# Patient Record
Sex: Male | Born: 1935 | Race: White | Hispanic: No | Marital: Married | Smoking: Never smoker
Health system: Southern US, Community
[De-identification: ages and names within clinical notes are randomized; demographics above are authoritative.]

## PROBLEM LIST (undated history)

## (undated) DIAGNOSIS — I499 Cardiac arrhythmia, unspecified: Secondary | ICD-10-CM

## (undated) DIAGNOSIS — K219 Gastro-esophageal reflux disease without esophagitis: Secondary | ICD-10-CM

## (undated) DIAGNOSIS — R55 Syncope and collapse: Secondary | ICD-10-CM

## (undated) DIAGNOSIS — I255 Ischemic cardiomyopathy: Secondary | ICD-10-CM

## (undated) DIAGNOSIS — I219 Acute myocardial infarction, unspecified: Secondary | ICD-10-CM

## (undated) DIAGNOSIS — N289 Disorder of kidney and ureter, unspecified: Secondary | ICD-10-CM

## (undated) DIAGNOSIS — F419 Anxiety disorder, unspecified: Secondary | ICD-10-CM

## (undated) DIAGNOSIS — Z8711 Personal history of peptic ulcer disease: Secondary | ICD-10-CM

## (undated) DIAGNOSIS — I519 Heart disease, unspecified: Secondary | ICD-10-CM

## (undated) DIAGNOSIS — I472 Ventricular tachycardia, unspecified: Secondary | ICD-10-CM

## (undated) DIAGNOSIS — F410 Panic disorder [episodic paroxysmal anxiety] without agoraphobia: Secondary | ICD-10-CM

## (undated) DIAGNOSIS — E875 Hyperkalemia: Secondary | ICD-10-CM

## (undated) DIAGNOSIS — I10 Essential (primary) hypertension: Secondary | ICD-10-CM

## (undated) DIAGNOSIS — E785 Hyperlipidemia, unspecified: Secondary | ICD-10-CM

## (undated) DIAGNOSIS — N400 Enlarged prostate without lower urinary tract symptoms: Secondary | ICD-10-CM

## (undated) DIAGNOSIS — I509 Heart failure, unspecified: Secondary | ICD-10-CM

## (undated) DIAGNOSIS — I2510 Atherosclerotic heart disease of native coronary artery without angina pectoris: Secondary | ICD-10-CM

## (undated) HISTORY — PX: ESOPHAGOGASTRODUODENOSCOPY: SHX1529

## (undated) HISTORY — DX: Essential (primary) hypertension: I10

## (undated) HISTORY — DX: Anxiety disorder, unspecified: F41.9

## (undated) HISTORY — DX: Disorder of kidney and ureter, unspecified: N28.9

## (undated) HISTORY — DX: Hyperlipidemia, unspecified: E78.5

## (undated) HISTORY — DX: Cardiac arrhythmia, unspecified: I49.9

## (undated) HISTORY — DX: Panic disorder (episodic paroxysmal anxiety): F41.0

## (undated) HISTORY — DX: Ventricular tachycardia, unspecified: I47.20

## (undated) HISTORY — DX: Heart failure, unspecified: I50.9

## (undated) HISTORY — DX: Acute myocardial infarction, unspecified: I21.9

## (undated) HISTORY — DX: Heart disease, unspecified: I51.9

## (undated) HISTORY — PX: CORONARY ANGIOPLASTY: SHX604

## (undated) HISTORY — DX: Personal history of peptic ulcer disease: Z87.11

## (undated) HISTORY — DX: Ventricular tachycardia: I47.2

## (undated) HISTORY — DX: Syncope and collapse: R55

## (undated) HISTORY — PX: UMBILICAL HERNIA REPAIR: SHX196

## (undated) HISTORY — PX: COLONOSCOPY: SHX174

## (undated) HISTORY — DX: Ischemic cardiomyopathy: I25.5

## (undated) HISTORY — DX: Hyperkalemia: E87.5

## (undated) HISTORY — PX: CARDIAC DEFIBRILLATOR PLACEMENT: SHX171

---

## 1986-10-28 DIAGNOSIS — I219 Acute myocardial infarction, unspecified: Secondary | ICD-10-CM

## 1986-10-28 HISTORY — DX: Acute myocardial infarction, unspecified: I21.9

## 1998-03-28 ENCOUNTER — Other Ambulatory Visit: Admission: RE | Admit: 1998-03-28 | Discharge: 1998-03-28 | Payer: Self-pay | Admitting: Cardiology

## 1999-06-28 ENCOUNTER — Ambulatory Visit (HOSPITAL_COMMUNITY): Admission: RE | Admit: 1999-06-28 | Discharge: 1999-06-28 | Payer: Self-pay | Admitting: Gastroenterology

## 2001-10-28 HISTORY — PX: CARDIAC CATHETERIZATION: SHX172

## 2001-12-29 ENCOUNTER — Ambulatory Visit (HOSPITAL_COMMUNITY): Admission: RE | Admit: 2001-12-29 | Discharge: 2001-12-30 | Payer: Self-pay | Admitting: Cardiology

## 2003-10-29 HISTORY — PX: INSERT / REPLACE / REMOVE PACEMAKER: SUR710

## 2003-11-23 ENCOUNTER — Encounter: Admission: RE | Admit: 2003-11-23 | Discharge: 2003-11-23 | Payer: Self-pay | Admitting: Family Medicine

## 2004-04-04 ENCOUNTER — Encounter (INDEPENDENT_AMBULATORY_CARE_PROVIDER_SITE_OTHER): Payer: Self-pay | Admitting: *Deleted

## 2004-04-09 ENCOUNTER — Ambulatory Visit (HOSPITAL_COMMUNITY): Admission: RE | Admit: 2004-04-09 | Discharge: 2004-04-09 | Payer: Self-pay | Admitting: Gastroenterology

## 2004-04-23 ENCOUNTER — Inpatient Hospital Stay (HOSPITAL_COMMUNITY): Admission: RE | Admit: 2004-04-23 | Discharge: 2004-04-24 | Payer: Self-pay | Admitting: Internal Medicine

## 2007-08-26 ENCOUNTER — Emergency Department (HOSPITAL_COMMUNITY): Admission: EM | Admit: 2007-08-26 | Discharge: 2007-08-26 | Payer: Self-pay | Admitting: Emergency Medicine

## 2007-12-30 ENCOUNTER — Encounter: Admission: RE | Admit: 2007-12-30 | Discharge: 2007-12-30 | Payer: Self-pay | Admitting: Family Medicine

## 2008-03-28 ENCOUNTER — Ambulatory Visit: Payer: Self-pay | Admitting: Internal Medicine

## 2009-01-24 ENCOUNTER — Encounter: Payer: Self-pay | Admitting: Internal Medicine

## 2009-04-11 DIAGNOSIS — I10 Essential (primary) hypertension: Secondary | ICD-10-CM

## 2009-04-11 DIAGNOSIS — I472 Ventricular tachycardia, unspecified: Secondary | ICD-10-CM | POA: Insufficient documentation

## 2009-04-11 DIAGNOSIS — R55 Syncope and collapse: Secondary | ICD-10-CM | POA: Insufficient documentation

## 2009-04-11 DIAGNOSIS — I509 Heart failure, unspecified: Secondary | ICD-10-CM | POA: Insufficient documentation

## 2009-04-24 ENCOUNTER — Ambulatory Visit: Payer: Self-pay | Admitting: Internal Medicine

## 2009-09-08 ENCOUNTER — Encounter: Payer: Self-pay | Admitting: Internal Medicine

## 2009-09-08 ENCOUNTER — Encounter (INDEPENDENT_AMBULATORY_CARE_PROVIDER_SITE_OTHER): Payer: Self-pay | Admitting: *Deleted

## 2010-05-08 LAB — LIPID PANEL
Cholesterol: 118 mg/dL (ref 0–200)
HDL: 34 mg/dL — AB (ref 35–70)

## 2010-06-15 ENCOUNTER — Encounter (INDEPENDENT_AMBULATORY_CARE_PROVIDER_SITE_OTHER): Payer: Self-pay | Admitting: *Deleted

## 2010-06-20 ENCOUNTER — Ambulatory Visit: Payer: Self-pay | Admitting: Cardiology

## 2010-07-18 ENCOUNTER — Ambulatory Visit: Payer: Self-pay | Admitting: Cardiology

## 2010-07-19 ENCOUNTER — Ambulatory Visit: Payer: Self-pay | Admitting: Cardiology

## 2010-07-19 ENCOUNTER — Ambulatory Visit (HOSPITAL_COMMUNITY)
Admission: RE | Admit: 2010-07-19 | Discharge: 2010-07-19 | Payer: Self-pay | Source: Home / Self Care | Admitting: Cardiology

## 2010-07-26 ENCOUNTER — Encounter: Payer: Self-pay | Admitting: Internal Medicine

## 2010-07-26 ENCOUNTER — Telehealth (INDEPENDENT_AMBULATORY_CARE_PROVIDER_SITE_OTHER): Payer: Self-pay | Admitting: *Deleted

## 2010-09-24 ENCOUNTER — Encounter: Payer: Self-pay | Admitting: Internal Medicine

## 2010-09-30 ENCOUNTER — Encounter: Payer: Self-pay | Admitting: Internal Medicine

## 2010-10-01 ENCOUNTER — Ambulatory Visit: Payer: Self-pay | Admitting: Internal Medicine

## 2010-10-17 ENCOUNTER — Telehealth (INDEPENDENT_AMBULATORY_CARE_PROVIDER_SITE_OTHER): Payer: Self-pay | Admitting: *Deleted

## 2010-11-06 ENCOUNTER — Encounter (INDEPENDENT_AMBULATORY_CARE_PROVIDER_SITE_OTHER): Payer: Self-pay | Admitting: *Deleted

## 2010-11-27 ENCOUNTER — Encounter: Payer: Self-pay | Admitting: Cardiology

## 2010-11-27 DIAGNOSIS — F410 Panic disorder [episodic paroxysmal anxiety] without agoraphobia: Secondary | ICD-10-CM | POA: Insufficient documentation

## 2010-11-27 DIAGNOSIS — I255 Ischemic cardiomyopathy: Secondary | ICD-10-CM | POA: Insufficient documentation

## 2010-11-27 DIAGNOSIS — E785 Hyperlipidemia, unspecified: Secondary | ICD-10-CM | POA: Insufficient documentation

## 2010-11-27 DIAGNOSIS — F419 Anxiety disorder, unspecified: Secondary | ICD-10-CM | POA: Insufficient documentation

## 2010-11-27 NOTE — Cardiovascular Report (Signed)
Summary: Certified Letter Signed - Patient  Certified Letter Signed - Patient   Imported By: Debby Freiberg 08/27/2010 15:40:33  _____________________________________________________________________  External Attachment:    Type:   Image     Comment:   External Document

## 2010-11-27 NOTE — Cardiovascular Report (Signed)
Summary: Quick Look Report  Quick Look Report   Imported By: Kassie Mends 11/22/2009 10:48:06  _____________________________________________________________________  External Attachment:    Type:   Image     Comment:   External Document

## 2010-11-27 NOTE — Letter (Signed)
Summary: Device-Delinquent Phone Journalist, newspaper, Main Office  [address]   [place], [place] [address]   Phone: [phone]  Fax: [phone]     September 24, 2010 MRN: [id number]   Adrian Pace [address] [place], [place]  [address]   Dear [name],  According to our records, you were scheduled for a device phone transmission on 09-20-2010 .     We did not receive any results from this check.  If you transmitted on your scheduled day, please call us to help troubleshoot your system.  If you forgot to send your transmission, please send one upon receipt of this letter.  Thank you,   Arch[facility]

## 2010-11-27 NOTE — Letter (Signed)
Summary: Device-Delinquent Check  [facility], Main Office  [address]   [place], [place] [address]   Phone: [phone]  Fax: [phone]     June 15, 2010 MRN: [id number]   Adrian Pace [address] [place], [place]  [address]   Dear [name],  According to our records, you have not had your implanted device checked in the recommended period of time.  We are unable to determine appropriate device function without checking your device on a regular basis.  Please call our office to schedule an appointment, with Adrian Pace,  as soon as possible.  If you are having your device checked by another physician, please call us so that we may update our records.  Thank you,  Altha Harm, LPN  June 15, 2010 2:18 PM  [facility]  certified mail

## 2010-11-27 NOTE — Progress Notes (Signed)
  Phone Note Call from Patient   Caller: Patient Summary of Call: pt calling letter from [facility]-pt had device checked 07-03-10 by dr Maylon Cos, due to have another in december Initial call taken by: Glynda Jaeger,  July 26, 2010 10:47 AM

## 2010-11-29 NOTE — Letter (Signed)
Summary: Remote Device Check  [facility], Main Office  [address]   [place], [place] [address]   Phone: [phone]  Fax: [phone]     November 06, 2010 MRN: [id number]   Adrian Pace [address] [place], [place]  [address]   Dear [name],   Your remote transmission was recieved and reviewed by your physician.  All diagnostics were within normal limits for you.  __X___Your next transmission is scheduled for:  01-03-11.  Please transmit at any time this day.  If you have a wireless device your transmission will be sent automatically.   Sincerely,  Vella Kohler

## 2010-11-29 NOTE — Progress Notes (Signed)
Summary: device phone transmission  Phone Note Call from Patient Call back at [phone]   Caller: Patient Reason for Call: Talk to Nurse Summary of Call: pt would like to know the results of his device phone transmission.  Initial call taken by: Roe Coombs,  October 17, 2010 10:41 AM  Follow-up for Phone Call        transmission recieved, patient aware.   Follow-up by: Altha Harm, LPN,  October 19, 2010 8:08 AM

## 2010-11-29 NOTE — Cardiovascular Report (Signed)
Summary: Office Visit Remote   Office Visit Remote   Imported By: Roderic Ovens 11/09/2010 12:08:15  _____________________________________________________________________  External Attachment:    Type:   Image     Comment:   External Document

## 2011-01-03 ENCOUNTER — Encounter: Payer: Self-pay | Admitting: Internal Medicine

## 2011-01-03 ENCOUNTER — Encounter (INDEPENDENT_AMBULATORY_CARE_PROVIDER_SITE_OTHER): Payer: Medicare Other

## 2011-01-03 DIAGNOSIS — I428 Other cardiomyopathies: Secondary | ICD-10-CM

## 2011-01-10 ENCOUNTER — Encounter: Payer: Self-pay | Admitting: *Deleted

## 2011-01-15 NOTE — Cardiovascular Report (Signed)
Summary: Office Visit   Office Visit   Imported By: Roderic Ovens 01/11/2011 14:55:43  _____________________________________________________________________  External Attachment:    Type:   Image     Comment:   External Document

## 2011-01-15 NOTE — Letter (Signed)
Summary: Remote Device Check  [facility], Main Office  [address]   [place], [place] [address]   Phone: [phone]  Fax: [phone]     January 10, 2011 MRN: [id number]   Adrian Pace [address] [place], [place]  [address]   Dear Mr. AHRENDT,   Your remote transmission was recieved and reviewed by your physician.  All diagnostics were within normal limits for you.  _____Your next transmission is scheduled for:                       .  Please transmit at any time this day.  If you have a wireless device your transmission will be sent automatically.  ___X___Your next office visit is scheduled for:  June with Dr. Ladona Ridgel.                            . Please call our office to schedule an appointment.    Sincerely,  Altha Harm, LPN

## 2011-01-16 ENCOUNTER — Encounter: Payer: Self-pay | Admitting: Cardiology

## 2011-01-16 ENCOUNTER — Ambulatory Visit (INDEPENDENT_AMBULATORY_CARE_PROVIDER_SITE_OTHER): Payer: Medicare Other | Admitting: Cardiology

## 2011-01-16 ENCOUNTER — Other Ambulatory Visit: Payer: Self-pay | Admitting: Cardiology

## 2011-01-16 VITALS — BP 130/68 | HR 62 | Ht 69.0 in | Wt 182.0 lb

## 2011-01-16 DIAGNOSIS — I255 Ischemic cardiomyopathy: Secondary | ICD-10-CM

## 2011-01-16 DIAGNOSIS — I2589 Other forms of chronic ischemic heart disease: Secondary | ICD-10-CM

## 2011-01-16 DIAGNOSIS — I472 Ventricular tachycardia, unspecified: Secondary | ICD-10-CM

## 2011-01-16 DIAGNOSIS — E7800 Pure hypercholesterolemia, unspecified: Secondary | ICD-10-CM

## 2011-01-16 DIAGNOSIS — Z79899 Other long term (current) drug therapy: Secondary | ICD-10-CM

## 2011-01-16 DIAGNOSIS — I519 Heart disease, unspecified: Secondary | ICD-10-CM

## 2011-01-16 LAB — COMPREHENSIVE METABOLIC PANEL
Albumin: 4.4 g/dL (ref 3.5–5.2)
Alkaline Phosphatase: 74 U/L (ref 39–117)
BUN: 17 mg/dL (ref 6–23)
CO2: 27 mEq/L (ref 19–32)
Calcium: 9.5 mg/dL (ref 8.4–10.5)
Chloride: 106 mEq/L (ref 96–112)
Glucose, Bld: 89 mg/dL (ref 70–99)
Potassium: 4.7 mEq/L (ref 3.5–5.3)
Sodium: 144 mEq/L (ref 135–145)
Total Bilirubin: 0.5 mg/dL (ref 0.3–1.2)
Total Protein: 7.1 g/dL (ref 6.0–8.3)

## 2011-01-16 LAB — LIPID PANEL
Cholesterol: 118 mg/dL (ref 0–200)
HDL: 31 mg/dL — ABNORMAL LOW (ref >39)
Triglycerides: 88 mg/dL (ref <150)
VLDL: 18 mg/dL (ref 0–40)

## 2011-01-16 NOTE — Assessment & Plan Note (Signed)
ICD followup per Dr. Sharrell Ku in June 2012. We will ask Dr. Ladona Ridgel to be his primary cardiologist

## 2011-01-16 NOTE — Assessment & Plan Note (Signed)
His ejection fraction remains in the 25% range from his 2-D echocardiogram of September 2011. Perhaps on future followup, he will need to have a LexiScan Myoview.

## 2011-01-16 NOTE — Progress Notes (Signed)
Subjective:Adrian Pace is seen today for followup visit. He has an ICD is checked by Dr. Sharrell Ku and he will see Dr. Ladona Ridgel for routine followup of his ICD as well as general cardiology in June 2012. It may be that Adrian Pace will need a followup related to his ischemic heart disease as well as left ventricular dysfunction and preventive cardiology. Overall, he clinically is doing well without significant complaints.   Current Outpatient Prescriptions  Medication Sig Dispense Refill  . aspirin 325 MG tablet Take 325 mg by mouth daily.        . benazepril (LOTENSIN) 20 MG tablet Take 20 mg by mouth daily.        . carvedilol (COREG) 25 MG tablet Take 25 mg by mouth 2 (two) times daily with meals.        . simvastatin (ZOCOR) 40 MG tablet Take 40 mg by mouth at bedtime.        . niacin (NIASPAN) 500 MG CR tablet Take 500 mg by mouth at bedtime.          No Known Allergies  Patient Active Problem List  Diagnoses  . DYSLIPIDEMIA  . HYPERTENSION  . VENTRICULAR TACHYCARDIA  . CHF  . SYNCOPE  . Ischemic cardiomyopathy  . Renal insufficiency  . Hyperlipemia  . HTN (hypertension)  . LV dysfunction  . Anxiety  . Panic attacks  . Hyperkalemia    History  Smoking status  . Never Smoker   Smokeless tobacco  . Not on file    History  Alcohol Use  . Yes    occassionally    No family history on file.  Review of Systems: The patient denies any heat or cold intolerance.  No weight gain or weight loss.  The patient denies headaches or blurry vision.  There is no cough or sputum production.  The patient denies dizziness.  There is no hematuria or hematochezia.  The patient denies any muscle aches or arthritis.  The patient denies any rash.  The patient denies frequent falling or instability.  There is no history of depression or anxiety.  All other systems were reviewed and are negative.   Physical Exam:vital signs are reviewed.The head is normocephalic and atraumatic.  Pupils are equally  round and reactive to light.  Sclerae nonicteric.  Conjunctiva is clear.  Oropharynx is unremarkable.  There's adequate oral airway.  Neck is supple there are no masses.  Thyroid is not enlarged.  There is no lymphadenopathy.  Lungs are clear.  Chest is symmetric.  Heart shows a regular rate and rhythm.  S1 and S2 are normal.  There is no murmur click or gallop.  Abdomen is soft normal bowel sounds.  There is no organomegaly.  Genital and rectal deferred.  Extremities are without edema.  Peripheral pulses are adequate.  Neurologically intact.  Full range of motion.  The patient is not depressed.  Skin is warm and dry.   Assessment / Plan:

## 2011-01-16 NOTE — Assessment & Plan Note (Signed)
His last stress Cardiolite study was approximately 2 years ago. In general, he's been able to exercise without symptoms. He's not had any ICD discharges. He's had previous angioplasty and stents but is never had bypass surgery. He had his anterior myocardial infarction in November of 1988. In general, his main issue is his ICD followup. We'll check C. Met and lipids today.

## 2011-01-17 ENCOUNTER — Telehealth: Payer: Self-pay | Admitting: *Deleted

## 2011-01-17 NOTE — Telephone Encounter (Signed)
Message copied by Barnetta Hammersmith on Thu Jan 17, 2011  9:24 AM ------      Message from: Norma Fredrickson      Created: Thu Jan 17, 2011  8:20 AM       Continue same medicines. Recheck in 6 months.

## 2011-01-22 ENCOUNTER — Telehealth: Payer: Self-pay | Admitting: *Deleted

## 2011-01-22 NOTE — Telephone Encounter (Signed)
Message copied by Barnetta Hammersmith on Tue Jan 22, 2011  2:57 PM ------      Message from: Norma Fredrickson      Created: Thu Jan 17, 2011  8:20 AM       Continue same medicines. Recheck in 6 months.

## 2011-01-22 NOTE — Telephone Encounter (Signed)
Pt notified of lab results and instructed to continue same medications.  Pt was notified to have labs in six months.

## 2011-03-12 NOTE — Assessment & Plan Note (Signed)
Adrian Pace                         ELECTROPHYSIOLOGY OFFICE NOTE   Adrian Pace                       MRN:          [id number]  DATE:03/28/2008                            DOB:          09-Jun-1933    Adrian Pace returns today for followup.  He is very pleasant middle-aged  male with ischemic cardiomyopathy, class I heart failure, a history of  VT who is status post ICD insertion.  He returns today for followup.  He  had no specific complaints today and has had no recent ICD shocks.   MEDICATIONS:  1. Niaspan 500 mg 2 tablets daily.  2. Zocor 40 mg daily.  3. Coreg 25 twice daily.  4. Lotensin 20 daily.  5. Zoloft 50 daily.  6. Aspirin for 325 daily.   PHYSICAL EXAM:  He is a pleasant well-appearing man in no acute  distress.  Blood pressure was 111/ 66.  The pulse was 67 and regular.  The  respirations were 18, and the weight was 155 pounds.  NECK:  No jugular distention.  LUNGS:  Clear bilaterally to auscultation.  No wheezes, rales or rhonchi  are present.  CARDIAC:  Regular rate and rhythm, normal S1-S2.  EXTREMITIES:  No edema.   Interrogation of his defibrillator demonstrates a Medtronic Maximo with  R-waves of 7, the impedance 368, the threshold 40.2.  Battery voltage  was 3.1 volts.  There are no intercurrent IC therapies.  He did have 8  episodes of nonsustained VT.   IMPRESSION:  1. Ischemic cardiomyopathy.  2. Ventricular tachycardia status post ICD shocks.  3. Congestive heart failure class I well-controlled.   DISCUSSION:  Adrian Pace is stable, and his defibrillator is working  normally.  I have recommend that we see him back in the office in 1 year  for ICD followup.  He will continue to follow up with Dr. Reyes Ivan.     Doylene Canning. Ladona Ridgel, MD  Electronically Signed    GWT/MedQ  DD: 03/28/2008  DT: 03/28/2008  Job #: [id number]

## 2011-03-15 NOTE — Op Note (Signed)
NAME:  Adrian Pace, Adrian Pace NO.:  [id number]   MEDICAL RECORD NO.:  [id number]                   PATIENT TYPE:  AMB   LOCATION:  ENDO                                 FACILITY:  [facility]   PHYSICIAN:  Petra Kuba, M.D.                 DATE OF BIRTH:  1932/11/02   DATE OF PROCEDURE:  04/04/2004  DATE OF DISCHARGE:                                 OPERATIVE REPORT   PROCEDURE PERFORMED:  Esophagogastroduodenoscopy with biopsy.   ENDOSCOPIST:  Petra Kuba, M.D.   INDICATIONS FOR PROCEDURE:  Upper tract symptoms helped with H2 blockers.  Want to rule out Barrett's or other etiologies.  Consent was signed after  the risks, benefits, methods and options were thoroughly discussed in the  office.   MEDICINES USED:  Additional medicines for this procedure were Demerol 10 mg,  Versed 2 mg.   DESCRIPTION OF PROCEDURE:  The video endoscope was inserted by direct  vision.  Proximal and midesophagus was normal.  In the distal esophagus was  a tiny small hiatal hernia with a widely patent ring.  No signs of c or  esophageal inflammation was seen.  Scope passed into the stomach and  advanced to the antrum where some antral erosions were seen probably aspirin  induced and advanced through a normal pylorus into a normal duodenal bulb,  pertinent for some mild bulbitis and around the C-loop to a normal second  portion of the duodenum.  The scope was withdrawn back to the bulb which  confirmed the mild bulbitis.  Scope was withdrawn back to the stomach and  retroflexed.  The angularis, cardia and fundus, lesser and greater curve  were all normal.  Straight visualization of the stomach did not reveal any  additional findings.  We went ahead and took a few biopsies of the antral  erosions and a few of the proximal stomach to rule out Helicobacter pylori.  Air was suctioned, scope was slowly withdrawn.  Again, a good look at the  esophagus was normal.  Scope was removed.  The  patient tolerated the  procedure well.  There were no obvious immediate complication.   ENDOSCOPIC DIAGNOSIS:  1. Tiny to small hiatal hernia with a widely patent ring.  2. Antral erosions, status post biopsy.  3. Mild bulbitis.  4. Otherwise normal esophagogastroduodenoscopy with some proximal stomach     biopsies taken as well to rule out Helicobacter pylori.   PLAN:  Continue Prilosec or Prevacid 15.  May need to upgrade or increase  the strength.  Probably go ahead and treat Helicobacter pylori if positive  and based on endoscopic findings if this were to get worse could lead to  ulceration.  Happy to see back p.r.n.  Otherwise return care to Drs.  Arvilla Market and Douglas for the customary health care maintenance.  Petra Kuba, M.D.    MEM/MEDQ  D:  04/04/2004  T:  04/04/2004  Job:  [id number]   cc:   Colleen Can. Deborah Chalk, M.D.  Fax: [phone]   Donia Guiles, M.D.  [address] [place]  [place] [address]  Fax: [phone]

## 2011-03-15 NOTE — Op Note (Signed)
NAME:  Adrian Pace, Adrian Pace NO.:  [id number]   MEDICAL RECORD NO.:  [id number]                   PATIENT TYPE:  OIB   LOCATION:  3741                                 FACILITY:  [facility]   PHYSICIAN:  Doylene Canning. Ladona Ridgel, M.D.               DATE OF BIRTH:  27-Feb-1933   DATE OF PROCEDURE:  04/23/2004  DATE OF DISCHARGE:                                 OPERATIVE REPORT   PROCEDURE:  Insertion of a single chamber defibrillator with defibrillation  threshold and device testing.   OPERATOR:  Doylene Canning. Ladona Ridgel, M.D.   INDICATIONS:  Ischemic cardiomyopathy status post MI with ejection fraction  of 29%.   HISTORY OF PRESENT ILLNESS:  A. The patient is a very pleasant 77 year old male with a history of     coronary artery disease status post MI.  He has an ejection fraction of     29%.  He is status post stenting to the right coronary artery and LAD in     3/03.  He now is referred for ICD insertion.   DESCRIPTION OF PROCEDURE:  A. After informed consent was obtained, the patient was taken to the     diagnostic EP lab in a fasting state.  After the usual prepping and     draping, intravenous Fentanyl and midazolam was given for sedation. Then     30 cc of Lidocaine was infiltrated into the left infraclavicular region.     A 9 cm incision was carried out over this region and electrocautery     utilized to dissect down to the fascial plane. Ten cc of contrast was     injected into the left upper extremity venous system, demonstrating a     patent left subclavian vein.  It was subsequently punctured x1 and the     Medtronic Model 6949, 65 cm active-tip fixation fibrillation lead, serial     #[id number] was advanced into the right ventricle.  Next, mapping was     secured high in the right ventricle at the final site near the artery     apex.  R waves measured 15 MV and the patient impendence was 884 ohms,     once the lead was actively fixed.  Patient threshold was 0.4  volts at 0.4     msc.   At this point, 10 volt pacing did not stimulate the diaphragm.  The lead was  then secured to the subpectoralis fascia with a figure-of-eight silk suture.  In addition, the sew in sleeve was secured with silk suture.  Electrocautery  was utilized to make a subcutaneous pocket.  Kanamycin was utilized to  irrigate the pocket.   The Medtronic Maximal VR model [id number], serial [id number], single chamber  defibrillator was connected to the defibrillation lead and placed in the  subcutaneous pocket. The generator was secured with silk suture.  Additional  Kanamycin was utilized to irrigate the  incision.  Electrocautery was  utilized to assure hemostasis. Defibrillation threshold testing was carried  out.   After the patient was more deeply sedated with Fentanyl and Versed, VF was  induced with a 2 wave shock.  Initially, a 15 joules shock was delivered,  terminating in ventricular fibrillation and restoring sinus rhythm. Five  minutes was allowed to elapse and a second DFT test carried out. Again, VF  was induced with a 2 wave shock, and a 10 joules shock was then delivered,  terminating ventricular fibrillation and restoring sinus rhythm.  At this  point, no additional defibrillation threshold testing was carried out, and  the incision was closed with a layer of 2-0 Vicryl, followed by a layer of 3-  0 Vicryl, followed by a layer of 4-0 Vicryl.  Benzoin was painted on the  skin, Steri-Strips were applied, and pressure dressing placed.  The patient  returned to his room in satisfactory condition.   A. Complications:  There were no immediate procedure complications.   A. Results:  This demonstrates successful implantation of a Medtronic single     chamber defibrillator in a patient with an ischemic cardiomegaly and an     ejection fraction of 29%.                                               Doylene Canning. Ladona Ridgel, M.D.    GWT/MEDQ  D:  04/23/2004  T:  04/23/2004  Job:   [id number]   cc:   Colleen Can. Deborah Chalk, M.D.  Fax: [phone]

## 2011-03-15 NOTE — Discharge Summary (Signed)
NAME:  Adrian Pace, Adrian Pace NO.:  [id number]   MEDICAL RECORD NO.:  [id number]                   PATIENT TYPE:  INP   LOCATION:  3741                                 FACILITY:  [facility]   PHYSICIAN:  Doylene Canning. Ladona Ridgel, M.D.               DATE OF BIRTH:  October 18, 1933   DATE OF ADMISSION:  04/23/2004  DATE OF DISCHARGE:  04/24/2004                                 DISCHARGE SUMMARY   PRIMARY DIAGNOSIS:  Ischemic cardiomyopathy.   HISTORY OF PRESENT ILLNESS:  A 77 year old gentleman with a history of  ischemic cardiomyopathy, status post myocardial infarction in 1988 and 1989.  He has also undergone angioplasty and stenting in the last year or two.  Despite his EF remains 29% based on echocardiogram Cardiolite stress  testing.   PAST MEDICAL HISTORY:  Includes dyslipidemia and hypertension.  The patient  has never had a syncopal episode.  Denies palpitations.  Has no chest pain.  Since he underwent stenting of the right coronary artery and LAD in March of  2003, his heart failure is class I.   HOSPITAL COURSE:  The patient was admitted for placement of an ICD.  He  underwent placement of a Medtronic-type ICD.  He tolerated the procedure  well, had no immediate postoperative complications, and was discharged to  home the following day in stable condition.  The patient did have a short 24-  beat run of nonsustained ventricular tachycardia while lying in bed.  He was  asymptomatic at that time.  He was discharged on the following medications.   DISCHARGE MEDICATIONS:  1. Coreg 25 mg b.i.d.  2. Lotensin 20 mg daily.  3. Lipitor 10 mg nightly.  4. Mobic 15 mg daily.  5. Prilosec 20 mg daily.  6. Tylenol one to two tablets every four to six hours as needed for pain.   ACTIVITY:  As per device discharge sheet.  No driving for 10 days.   WOUND CARE:  As per device discharge sheet.   DIET:  Low-fat, low-salt, low-cholesterol diet.   FOLLOWUP:  The patient was to be  seen at the pacer clinic at [facility] on May 09, 2004, at 9:30 a.m. and Dr. Ladona Ridgel on August 15, 2004, at 9:40 a.m.      Chinita Pester, C.R.N.P. [facility]                 Doylene Canning. Ladona Ridgel, M.D.    DS/MEDQ  D:  04/24/2004  T:  04/24/2004  Job:  [id number]   cc:   [facility]   [place]. Deborah Chalk, M.D.  Fax: [phone]   Donia Guiles, M.D.  [address] [place]  [place] [address]  Fax: [phone]

## 2011-03-15 NOTE — Op Note (Signed)
NAME:  Adrian Pace, Adrian Pace NO.:  [id number]   MEDICAL RECORD NO.:  [id number]                   PATIENT TYPE:  AMB   LOCATION:  ENDO                                 FACILITY:  [facility]   PHYSICIAN:  Petra Kuba, M.D.                 DATE OF BIRTH:  April 17, 1933   DATE OF PROCEDURE:  04/04/2004  DATE OF DISCHARGE:                                 OPERATIVE REPORT   PROCEDURE:  Colonoscopy.   INDICATIONS:  Patient with history of colon polyps, due for repeat  screening, some GI symptoms.  Consent was signed after risks, benefits,  methods, and options thoroughly discussed in the office on multiple  occasions.   MEDICINES USED:  Demerol 50 mg, Versed 6 mg.   PROCEDURE:  Rectal inspection was pertinent for external hemorrhoids, small.  Digital exam was negative.  The video pediatric adjustable colonoscope was  inserted, fairly easily advanced around the colon to the cecum.  This did  require rolling him on his back and some abdominal pressure.  No  abnormalities were seen on insertion.  The cecum was identified by the  appendiceal orifice and the ileocecal valve.  In fact, the scope was  inserted a short way into the terminal ileum, which was normal.  Photo  documentation was obtained, and the scope was slowly withdrawn.  The prep  was adequate.  There was some liquid stool that required washing and  suctioning.  On slow withdrawal through the colon no polyps, tumors, masses,  or diverticula were seen as we slowly withdrew back to the rectum.  Anorectal pull-through and retroflexion confirmed some small hemorrhoids.  The scope was reinserted a short way up the left side of the colon, air was  suctioned, and the scope removed.  The patient tolerated the procedure well.  There was no obvious immediate complication.   ENDOSCOPIC DIAGNOSES:  1. Internal-external hemorrhoids.  2. Otherwise within normal limits to the terminal ileum.   PLAN:  Probably repeat  screening in five years if doing well medically.  Continue workup with an EGD, otherwise return care to Dr. Arvilla Market for the  customary health care maintenance to include yearly rectals and guaiacs.                                               Petra Kuba, M.D.    MEM/MEDQ  D:  04/04/2004  T:  04/04/2004  Job:  [id number]   cc:   Colleen Can. Deborah Chalk, M.D.  Fax: [phone]   Donia Guiles, M.D.  [address] [place]  [place] [address]  Fax: [phone]

## 2011-03-15 NOTE — Discharge Summary (Signed)
[place]. [facility]  Patient:    Adrian Pace, Adrian Pace Visit Number: [id number] MRN: [id number]          Service Type: CAT Location: 6500 [id number] Attending Physician:  Eleanora Neighbor Dictated by:   Jennet Maduro Earl Gala, R.N., A.N.P. Admit Date:  12/29/2001 Discharge Date: 12/30/2001   CC:         Desma Maxim, M.D.   Discharge Summary  PRIMARY DISCHARGE DIAGNOSES: 1. Abnormal treadmill testing with subsequent elective coronary angiography    with normal left main; 90% proximal left anterior descending artery between    the first and second diagonal branches with a 60% distal left anterior    descending artery; right coronary artery with a severe complex lesion after    the acute margin of a 90% nature; and small unremarkable left circumflex;    anterior apical hypokinesis with ejection fraction of 30%; subsequent stent    placement of a 3.0 x 15 mm stent to the right coronary artery, and a 3.0 x    18 mm stent to the left anterior descending artery. 2. Known atherosclerotic cardiovascular disease with previous anterior    myocardial infarction in November 1988. 3. Left ventricular dysfunction, on carvedilol and ACE inhibitor. 4. Hypercholesterolemia. 5. Mild hypertensive heart disease.  HISTORY OF PRESENT ILLNESS:  Mr. Basnett is a very pleasant 77 year old white male who has known atherosclerotic cardiovascular disease who presented to our office for a routine treadmill testing on December 21, 2001, and with that he demonstrated excellent exercise tolerance with an adequate blood pressure response.  He had no complaints of angina.  Electrocardiographically he demonstrated resting changes that worsened with exercise, and felt to be more consistent with ischemia.  He was subsequently referred on for repeat coronary angiography.  From a clinical standpoint he has had no reoccurrence of chest pain.  Please see the dictated history and physical for  further patient presentation and profile.  ADMISSION LABORATORY DATA:  PT and PTT were unremarkable.  Chemistries were normal.  CBC was normal as well.  HOSPITAL COURSE:  The patient was admitted.  We proceeded on with elective coronary angiography on December 29, 2001, those results are as noted above.  The procedure was tolerated well without any known complications.  The patient did receive 12 hours of IV Integrilin and was started on Plavix post procedure. Afterwards, he was transferred to 6500 for further monitoring and evaluation, and today on 12/30/01, he is doing well without any complaints.  His overall physical examination is unremarkable.  He has had some slight oozing from the cath puncture site which has been controlled with pressure dressing.  His morning labs are unremarkable.  His cardiac enzymes are negative.  His EKG continues to show anterior lateral changes, and overall he is felt to be a stable candidate for discharge today.  CONDITION ON DISCHARGE:  Stable.  DISCHARGE MEDICATIONS: 1. He will resume all of his previous home medications. 2. Plavix 75 mg p.o. q.d. x3 weeks, to be taken with aspirin.  ACTIVITY:  Light over the next week.  FOLLOWUP:  We will have him follow up in the office one day next week, and he is asked to call to schedule that appointment.  He is to call if any problems were to arise in the interim. Dictated by:   Jennet Maduro Earl Gala, R.N., A.N.P. Attending Physician:  Eleanora Neighbor DD:  12/30/01 TD:  12/31/01 Job: [id number]

## 2011-03-15 NOTE — H&P (Signed)
[place]. [facility]  Patient:    Adrian Pace, Adrian Pace Visit Number: [id number] MRN: [id number]          Service Type: Attending:  Colleen Can. Deborah Chalk, M.D. Dictated by:   Jennet Maduro Earl Gala, R.N., A.N.P. Adm. Date:  12/29/01   CC:         Colleen Can. Deborah Chalk, M.D.  Desma Maxim, M.D.   History and Physical  CHIEF COMPLAINT:  None.  HISTORY OF PRESENT ILLNESS:  Mr. Vert is a very pleasant 77 year old white male who has known atherosclerotic cardiovascular disease.  He suffered a previous anterior myocardial infarction in 1988.  His last cardiac catheterization was in 1995 which revealed severe LV dysfunction and single vessel disease with a 60-70% narrowing of the proximal LAD.  He has been managed medically and followed along with serial treadmill testing.  He presented to our office on December 21, 2001 for his routine treadmill test.  With that, he demonstrated excellent exercise tolerance with adequate blood pressure response.  There were no complaints of angina with the study. Electrocardiographically, however, he demonstrates resting changes with exercise.  He is felt to have more ST and T-wave changes consistent with ischemia.  He is now referred on for repeat coronary angiography.  From a cardiac standpoint, he has clinically done well without recurrence of chest pain.  PAST MEDICAL HISTORY:  Atherosclerotic cardiovascular disease with previous anterior MI in November 1988.  He did receive tPA and subsequent angioplasty. His last cardiac catheterization was in 1995, which revealed moderately severe LV dysfunction with anterior apical akinesis, essentially single vessel disease with a 50-70% narrowing in the proximal LAD located in what was clearly felt to be the old infarct-related vessel.  LV dysfunction. Hypercholesterolemia with most recent cholesterol panel showing total cholesterol of 106, HDL 27, LDL 60, and triglycerides 97.  Mild  hypertensive heart disease.  Prior history of a hernia repair.  ALLERGIES:  None.  INTOLERANCE:  NIASPAN.  FAMILY HISTORY:  Positive for coronary disease.  His father has had two previous heart attacks and is deceased due to cardiovascular disease.  SOCIAL HISTORY:  He is married.  There is no alcohol or tobacco use.  He does exercise regularly.  REVIEW OF SYSTEMS:  As stated above.  Otherwise unremarkable.  He has had no GI complaints, negative GU.  No syncope, lightheadedness, or dizziness.  No prior bouts of chest discomfort.  He continues to exercise regularly.  PHYSICAL EXAMINATION:  GENERAL:  He is a very pleasant white male who appears younger than his stated age.  VITAL SIGNS:  Blood pressure 110/60 sitting, 100/80 standing, heart rate is 56 and regular, respirations 18.  He is afebrile.  SKIN:  Warm and dry.  Color is unremarkable.  LUNGS:  Clear.  HEART:  Irregular rhythm.  ABDOMEN:  Soft.  Positive bowel sounds.  Nontender.  EXTREMITIES:  No peripheral edema.  NEUROLOGICAL:  Intact.  There are no gross focal deficits.  LABORATORY DATA:  Pending.  IMPRESSION: 1. Abnormal stress testing in the setting of known atherosclerotic    cardiovascular disease with previous anterior myocardial infarction and    known single vessel disease. 2. Severe left ventricular dysfunction, currently maintained on ACE inhibitor    and carvedilol.  PLAN:  We will proceed on with elective coronary angiography.  The procedure has been discussed in full detail and he is willing to proceed. Dictated by:   Jennet Maduro Earl Gala, R.N., A.N.P. Attending:  Colleen Can. Deborah Chalk, M.D.  DD:  12/21/01 TD:  12/21/01 Job: [id number]

## 2011-03-15 NOTE — Cardiovascular Report (Signed)
[place]. [facility]  Patient:    Adrian Pace, Adrian Pace Visit Number: [id number] MRN: [id number]          Service Type: CAT Location: 6500 [id number] Attending Physician:  Eleanora Neighbor Dictated by:   Colleen Can. Deborah Chalk, M.D. Proc. Date: 12/29/01 Admit Date:  12/29/2001   CC:         Desma Maxim, M.D.   Cardiac Catheterization  HISTORY: The patient has previous anterior myocardial infarction in 1988. Last catheterization was in 1995, which showed moderate stenosis in the proximal left anterior descending, but it was the site of the previous angioplasty in 1988 and the site of the previous infarction. He had no real change in his exercise tolerance but had more changes on his ECG, and spite of lack of symptoms, was referred for catheterization.  PROCEDURE: Left heart catheterization with selective coronary angiography, left ventricular angiography, angioplasty of the right coronary artery, angioplasty of the left anterior descending.  TYPE AND SITE OF ENTRY: Percutaneous right femoral artery.  CATHETERS: The 6 French 4 curved Judkins right and left coronary catheters, 6 French pigtail ventriculographic catheter, JR4 right coronary catheter, 7 [place], JL4 left coronary catheter, Hi-Torque Floppy guide wire, a 3.0 x 15 mm Zeta stent, and a 3.0 x 18 mm Zeta stent.  MEDICATIONS GIVEN PRIOR TO THE PROCEDURE: Valium 10 mg p.o.  MEDICATIONS GIVEN DURING THE PROCEDURE: Versed 4 mg IV total.  Integrilin, IV heparin and IV nitroglycerin.  COMMENTS: The patient tolerated the procedure well.  HEMODYNAMIC DATA: The aortic pressure was 138/69, LV was 139/30.  There was no aortic valve gradient noted on pullback.  ANGIOGRAPHIC DATA: 1. Right coronary artery: The right coronary artery was a reasonably large    dominant vessel. It had irregularities proximally. Between the acute    marginal and the crux there was a 90% complex lesion with filling  defect    present. Distal vessels were satisfactorily large. There is a reasonably    focal but severe narrowing. 2. Left main coronary artery: The left main coronary artery had mild    narrowing. 3. Left anterior descending: The left anterior descending had a complex plaque    with 90% narrowing between the first and second diagonals. After the second    diagonal there was approximately a 50% smooth narrowing. The left anterior    descending continued to the apex. 4. Left circumflex: The left circumflex is a relatively small vessel without    significant disease.  LEFT VENTRICULOGRAPHY: Left ventricular angiogram was performed in the RAO position. The overall cardiac size and silhouette were normal.  The estimated ejection fraction approximately 30% with anterior apical hypokinesis and mild dyskinesia at the apex.  ANGIOPLASTY PROCEDURE: We initially turned our attention to the right coronary artery. Using the JR4 guide and Hi-Torque Floppy guide wire, we easily positioned the stent across the stenosis and performed primary stenting on the vessel with an excellent angiographic result. There was no residual stenosis after the stent was placed.  We then turned out attention to the left anterior descending. The JL4 guiding catheter was a satisfactory fit. We were able to easily pass the Hi-Torque Floppy guide wire. We again performed primary stenting and positioned the 3.0 x 18 mm Zeta stent across the stenosis.  The balloons were inflated to a maximum of 14 atmospheres. The final angiographic result was felt to be excellent and there was no compromise of either diagonal vessels.  OVERALL IMPRESSION: 1. Severe left  ventricular dysfunction. 2. Severe stenosis in the left anterior descending and right coronary artery    with successful stent placement in both vessels. Dictated by:   Colleen Can Deborah Chalk, M.D. Attending Physician:  Eleanora Neighbor DD:  12/29/01 TD:  12/29/01 Job:  [id number]

## 2011-04-15 ENCOUNTER — Encounter: Payer: Self-pay | Admitting: Cardiology

## 2011-04-24 ENCOUNTER — Encounter: Payer: Self-pay | Admitting: Internal Medicine

## 2011-04-30 ENCOUNTER — Encounter: Payer: Medicare Other | Admitting: Internal Medicine

## 2011-05-02 ENCOUNTER — Other Ambulatory Visit: Payer: Self-pay | Admitting: *Deleted

## 2011-05-02 MED ORDER — CARVEDILOL 25 MG PO TABS: 25.0000 mg | ORAL_TABLET | Freq: Two times a day (BID) | ORAL | Status: DC

## 2011-05-02 MED ORDER — SIMVASTATIN 40 MG PO TABS: 40.0000 mg | ORAL_TABLET | Freq: Every day | ORAL | Status: DC

## 2011-05-02 NOTE — Telephone Encounter (Signed)
Refill of simvastatin and coreg-done

## 2011-06-04 ENCOUNTER — Encounter: Payer: Self-pay | Admitting: Internal Medicine

## 2011-06-04 ENCOUNTER — Ambulatory Visit (INDEPENDENT_AMBULATORY_CARE_PROVIDER_SITE_OTHER): Payer: Medicare Other | Admitting: Internal Medicine

## 2011-06-04 VITALS — BP 124/58 | HR 67 | Ht 68.0 in | Wt 183.0 lb

## 2011-06-04 DIAGNOSIS — I472 Ventricular tachycardia: Secondary | ICD-10-CM

## 2011-06-04 DIAGNOSIS — I10 Essential (primary) hypertension: Secondary | ICD-10-CM

## 2011-06-04 DIAGNOSIS — I509 Heart failure, unspecified: Secondary | ICD-10-CM

## 2011-06-04 NOTE — Progress Notes (Signed)
HPI Adrian Pace returns today after a long absence from our EP clinic. He is a very pleasant 77 year old man with a history of an ischemic cardiomyopathy, chronic systolic heart failure, ventricular tachycardia, status post ICD implantation. The patient denies chest pain or shortness of breath. No syncope. No recent ICD shocks. He denies noncompliance with medications or diet. No Known Allergies   Current Outpatient Prescriptions  Medication Sig Dispense Refill  . aspirin 325 MG tablet Take 325 mg by mouth daily.        . benazepril (LOTENSIN) 20 MG tablet Take 20 mg by mouth daily.        . carvedilol (COREG) 25 MG tablet Take 1 tablet (25 mg total) by mouth 2 (two) times daily with a meal.  180 tablet  3  . diphenhydrAMINE (SOMINEX) 25 MG tablet Take 25 mg by mouth at bedtime as needed.        . simvastatin (ZOCOR) 40 MG tablet Take 1 tablet (40 mg total) by mouth at bedtime.  90 tablet  3     Past Medical History  Diagnosis Date  . Ischemic cardiomyopathy   . Renal insufficiency   . Hyperlipemia   . HTN (hypertension)   . Myocardial infarct 1988  . LV dysfunction   . Anxiety   . Panic attacks   . Hyperkalemia   . Ventricular tachycardia   . CHF (congestive heart failure)   . Syncope and collapse     ROS:   All systems reviewed and negative except as noted in the HPI.   Past Surgical History  Procedure Date  . Cardiac catheterization   . Coronary angioplasty   . Cardiac defibrillator placement   . Hernia repair   . Colonoscopy   . Esophagogastroduodenoscopy     with biopsy     Family History  Problem Relation Age of Onset  . Heart attack Father     x2  . Heart disease Father      History   Social History  . Marital Status: Married    Spouse Name: N/A    Number of Children: N/A  . Years of Education: N/A   Occupational History  . Not on file.   Social History Main Topics  . Smoking status: Never Smoker   . Smokeless tobacco: Not on file  . Alcohol  Use: Yes     occassionally  . Drug Use: No  . Sexually Active: Not on file   Other Topics Concern  . Not on file   Social History Narrative  . No narrative on file     BP 124/58  Pulse 67  Ht 5\' 8"  (1.727 m)  Wt 183 lb (83.008 kg)  BMI 27.82 kg/m2  Physical Exam:  Well appearing NAD HEENT: Unremarkable Neck:  No JVD, no thyromegally Lymphatics:  No adenopathy Back:  No CVA tenderness Lungs:  Clear with no wheezes. Well healed ICD incision. HEART:  Regular rate rhythm, no murmurs, no rubs, no clicks Abd:  soft, positive bowel sounds, no organomegally, no rebound, no guarding Ext:  2 plus pulses, no edema, no cyanosis, no clubbing Skin:  No rashes no nodules Neuro:  CN II through XII intact, motor grossly intact  DEVICE  Normal device function.  See PaceArt for details.   Assess/Plan:

## 2011-06-04 NOTE — Assessment & Plan Note (Signed)
Chronic systolic heart failure appears to be well-controlled. He'll maintain a low-sodium diet, and asked to continue his regular daily activity. He will continue his current medical therapy.

## 2011-06-04 NOTE — Assessment & Plan Note (Signed)
His blood pressure is well-controlled. He will maintain a low-sodium diet and his current medical therapy.

## 2011-06-04 NOTE — Assessment & Plan Note (Signed)
He has had no recent ICD shocks and no palpitations. Continue current medical therapy.

## 2011-06-04 NOTE — Patient Instructions (Signed)
Your physician wants you to follow-up in: 12 months with Dr Stevan Born will receive a reminder letter in the mail two months in advance. If you don't receive a letter, please call our office to schedule the follow-up appointment.  Remote monitoring is used to monitor your Pacemaker of ICD from home. This monitoring reduces the number of office visits required to check your device to one time per year. It allows [place] to keep an eye on the functioning of your device to ensure it is working properly. You are scheduled for a device check from home on 09/05/2011. You may send your transmission at any time that day. If you have a wireless device, the transmission will be sent automatically. After your physician reviews your transmission, you will receive a postcard with your next transmission date.

## 2011-07-09 ENCOUNTER — Ambulatory Visit (INDEPENDENT_AMBULATORY_CARE_PROVIDER_SITE_OTHER): Payer: Medicare Other | Admitting: Cardiology

## 2011-07-09 ENCOUNTER — Encounter: Payer: Self-pay | Admitting: Cardiology

## 2011-07-09 DIAGNOSIS — I472 Ventricular tachycardia, unspecified: Secondary | ICD-10-CM

## 2011-07-09 DIAGNOSIS — I2589 Other forms of chronic ischemic heart disease: Secondary | ICD-10-CM

## 2011-07-09 DIAGNOSIS — I2510 Atherosclerotic heart disease of native coronary artery without angina pectoris: Secondary | ICD-10-CM

## 2011-07-09 DIAGNOSIS — I10 Essential (primary) hypertension: Secondary | ICD-10-CM

## 2011-07-09 DIAGNOSIS — Z79899 Other long term (current) drug therapy: Secondary | ICD-10-CM

## 2011-07-09 DIAGNOSIS — I255 Ischemic cardiomyopathy: Secondary | ICD-10-CM

## 2011-07-09 DIAGNOSIS — Z9861 Coronary angioplasty status: Secondary | ICD-10-CM | POA: Insufficient documentation

## 2011-07-09 DIAGNOSIS — E785 Hyperlipidemia, unspecified: Secondary | ICD-10-CM

## 2011-07-09 NOTE — Assessment & Plan Note (Signed)
Status post ICD. Management per electrophysiology. 

## 2011-07-09 NOTE — Progress Notes (Signed)
HPI: 77 year old male previously followed by Dr. Iantha Fallen for followup of coronary disease. The patient had his last cardiac catheterization in 2003. He had PCI of his LAD and right coronary artery. His ejection fraction was 30%. Since he was last seen he denies dyspnea on exertion, orthopnea, PND, pedal edema, palpitations, syncope or chest pain.  Current Outpatient Prescriptions  Medication Sig Dispense Refill  . aspirin 325 MG tablet Take 325 mg by mouth daily.        . benazepril (LOTENSIN) 20 MG tablet Take 20 mg by mouth daily.        . carvedilol (COREG) 25 MG tablet Take 1 tablet (25 mg total) by mouth 2 (two) times daily with a meal.  180 tablet  3  . omeprazole (PRILOSEC) 20 MG capsule Take 20 mg by mouth daily.        . simvastatin (ZOCOR) 40 MG tablet Take 1 tablet (40 mg total) by mouth at bedtime.  90 tablet  3     Past Medical History  Diagnosis Date  . Ischemic cardiomyopathy   . Renal insufficiency   . Hyperlipemia   . HTN (hypertension)   . Myocardial infarct 1988  . LV dysfunction   . Anxiety   . Panic attacks   . Hyperkalemia   . Ventricular tachycardia   . CHF (congestive heart failure)   . Syncope and collapse     Past Surgical History  Procedure Date  . Cardiac catheterization   . Coronary angioplasty   . Cardiac defibrillator placement   . Hernia repair   . Colonoscopy   . Esophagogastroduodenoscopy     with biopsy    History   Social History  . Marital Status: Married    Spouse Name: N/A    Number of Children: N/A  . Years of Education: N/A   Occupational History  . Not on file.   Social History Main Topics  . Smoking status: Never Smoker   . Smokeless tobacco: Not on file  . Alcohol Use: Yes     occassionally  . Drug Use: No  . Sexually Active: Not on file   Other Topics Concern  . Not on file   Social History Narrative  . No narrative on file    ROS: no fevers or chills, productive cough, hemoptysis, dysphasia, odynophagia,  melena, hematochezia, dysuria, hematuria, rash, seizure activity, orthopnea, PND, pedal edema, claudication. Remaining systems are negative.  Physical Exam: Well-developed well-nourished in no acute distress.  Skin is warm and dry.  HEENT is normal.  Neck is supple. No thyromegaly.  Chest is clear to auscultation with normal expansion.  Cardiovascular exam is regular rate and rhythm.  Abdominal exam nontender or distended. No masses palpated. Extremities show no edema. neuro grossly intact  ECG sinus rhythm with occasional PVCs and PACs. Previous septal infarct. Lateral T-wave inversion.

## 2011-07-09 NOTE — Assessment & Plan Note (Signed)
Continue ACE inhibitor and beta blocker. Check potassium and renal function. 

## 2011-07-09 NOTE — Assessment & Plan Note (Signed)
Blood pressure controlled. Continue present medications. 

## 2011-07-09 NOTE — Progress Notes (Signed)
Addended by: Alma Friendly on: 07/09/2011 09:46 AM   Modules accepted: Orders

## 2011-07-09 NOTE — Assessment & Plan Note (Signed)
Continue statin. Check lipids and liver. 

## 2011-07-09 NOTE — Patient Instructions (Addendum)
Your physician recommends that you schedule a follow-up appointment in:  6 MONTHS  WITH DR Jens Som  Your physician recommends that you continue on your current medications as directed. Please refer to the Current Medication list given to you today. Your physician recommends that you return for lab work in: TODAY BMET LIPID LIVER  DX 414.01 272.0 V58.69  Your physician has requested that you have a lexiscan myoview. For further information please visit [email or web address] Please follow instruction sheet, as given. DX 414.01

## 2011-07-09 NOTE — Assessment & Plan Note (Signed)
Plan continue aspirin, beta blocker, ACE inhibitor and statin. Schedule myoview for risk stratification.

## 2011-07-10 LAB — HEPATIC FUNCTION PANEL
ALT: 15 U/L (ref 0–53)
AST: 22 U/L (ref 0–37)
Albumin: 3.9 g/dL (ref 3.5–5.2)
Alkaline Phosphatase: 72 U/L (ref 39–117)
Bilirubin, Direct: 0.1 mg/dL (ref 0.0–0.3)
Total Bilirubin: 0.5 mg/dL (ref 0.3–1.2)
Total Protein: 6.7 g/dL (ref 6.0–8.3)

## 2011-07-10 LAB — BASIC METABOLIC PANEL
BUN: 16 mg/dL (ref 6–23)
CO2: 29 mEq/L (ref 19–32)
Calcium: 8.6 mg/dL (ref 8.4–10.5)
Chloride: 105 mEq/L (ref 96–112)
GFR: 53.6 mL/min — ABNORMAL LOW (ref >60.00)
Glucose, Bld: 93 mg/dL (ref 70–99)
Potassium: 4.5 mEq/L (ref 3.5–5.1)
Sodium: 141 mEq/L (ref 135–145)

## 2011-07-10 LAB — LIPID PANEL
Cholesterol: 102 mg/dL (ref 0–200)
HDL: 31.1 mg/dL — ABNORMAL LOW (ref >39.00)
LDL Cholesterol: 56 mg/dL (ref 0–99)
Total CHOL/HDL Ratio: 3
Triglycerides: 76 mg/dL (ref 0.0–149.0)
VLDL: 15.2 mg/dL (ref 0.0–40.0)

## 2011-07-16 ENCOUNTER — Encounter: Payer: Self-pay | Admitting: *Deleted

## 2011-07-24 ENCOUNTER — Ambulatory Visit (HOSPITAL_COMMUNITY): Payer: Medicare Other | Attending: Cardiology | Admitting: Radiology

## 2011-07-24 DIAGNOSIS — Z8249 Family history of ischemic heart disease and other diseases of the circulatory system: Secondary | ICD-10-CM | POA: Insufficient documentation

## 2011-07-24 DIAGNOSIS — R002 Palpitations: Secondary | ICD-10-CM | POA: Insufficient documentation

## 2011-07-24 DIAGNOSIS — I252 Old myocardial infarction: Secondary | ICD-10-CM | POA: Insufficient documentation

## 2011-07-24 DIAGNOSIS — I4949 Other premature depolarization: Secondary | ICD-10-CM

## 2011-07-24 DIAGNOSIS — I2510 Atherosclerotic heart disease of native coronary artery without angina pectoris: Secondary | ICD-10-CM

## 2011-07-24 DIAGNOSIS — I10 Essential (primary) hypertension: Secondary | ICD-10-CM | POA: Insufficient documentation

## 2011-07-24 MED ORDER — TECHNETIUM TC 99M TETROFOSMIN IV KIT
11.0000 | PACK | Freq: Once | INTRAVENOUS | Status: AC | PRN
  Administered 2011-07-24: 11 via INTRAVENOUS

## 2011-07-24 MED ORDER — TECHNETIUM TC 99M TETROFOSMIN IV KIT
33.0000 | PACK | Freq: Once | INTRAVENOUS | Status: AC | PRN
  Administered 2011-07-24: 33 via INTRAVENOUS

## 2011-07-24 MED ORDER — REGADENOSON 0.4 MG/5ML IV SOLN
0.4000 mg | Freq: Once | INTRAVENOUS | Status: AC
  Administered 2011-07-24: 0.4 mg via INTRAVENOUS

## 2011-07-24 NOTE — Progress Notes (Addendum)
[name] SITE 3 NUCLEAR MED [address] [place] [address] [phone]  Cardiology Nuclear Med Study  Adrian Pace is a 77 y.o. male [id number] November 18, 1932   Nuclear Med Background Indication for Stress Test:  Evaluation for Ischemia, Graft Patency and Stent Patency History:   05-Defibrillator,03 GXT- Abn, 03  Heart Catheterization EF-30%,1988 Myocardial Infarction, Myocardial Perfusion Study and 03 Stents- RCA,LAD Cardiac Risk Factors: Family History - CAD, Hypertension and Lipids  Symptoms:  Palpitations   Nuclear Pre-Procedure Caffeine/Decaff Intake:  None NPO After: 8:00pm   Lungs:  Clear IV 0.9% NS with Angio Cath:  20g  IV Site: R Antecubital  IV Started by:  Adrian Pace, EMT-P  Chest Size (in):  43 Cup Size: n/a  Height: 5\' 8"  (1.727 m)  Weight:  181 lb (82.101 kg)  BMI:  Body mass index is 27.52 kg/(m^2). Tech Comments:  Coreg held this am, per patient    Nuclear Med Study 1 or 2 day study: 1 day  Stress Test Type:  Treadmill/Lexiscan  Reading MD: Adrian Miss, MD  Order Authorizing Provider:  Dr. Velta Pace  Resting Radionuclide: Technetium 12m Tetrofosmin  Resting Radionuclide Dose: 11.0 mCi   Stress Radionuclide:  Technetium 58m Tetrofosmin  Stress Radionuclide Dose: 33.0 mCi           Stress Protocol Rest HR: 70 Stress HR: 108  Rest BP: 155/82 Stress BP: 156/77  Exercise Time (min): n/a METS: n/a   Predicted Max HR: 144 bpm % Max HR: 75 bpm Rate Pressure Product: 46962   Dose of Adenosine (mg):  n/a Dose of Lexiscan: 0.4 mg  Dose of Atropine (mg): n/a Dose of Dobutamine: n/a mcg/kg/min (at max HR)  Stress Test Technologist: Adrian Levan, RN  Nuclear Technologist:  Adrian Pace, CNMT     Rest Procedure:  Myocardial perfusion imaging was performed at rest 45 minutes following the intravenous administration of Technetium 30m Tetrofosmin. Rest ECG: NSR, No acute changes from prior EKG, occ. PVC's  Stress Procedure:  The  patient received IV Lexiscan 0.4 mg over 15-seconds with concurrent low level exercise and then Technetium 40m Tetrofosmin was injected at 30-seconds while the patient continued walking one more minute.  There were no significant changes with Lexiscan.  Quantitative spect images were obtained after a 45-minute delay. Stress ECG: No significant change from baseline ECG  QPS Raw Data Images:  Normal; no motion artifact; normal heart/lung ratio. Stress Images:  There is a very large,  severe  defect in the anterior wall, apex and septum.   The uptake in the remaining walls is fairly normal. Rest Images:  There is a very large,  severe  defect in the anterior wall, apex and septum.   The uptake in the remaining walls is fairly normal Subtraction (SDS):  No evidence of ischemia.  There is evidence of a previous large anterior MI. Transient Ischemic Dilatation (Normal <1.22):  1.12 Lung/Heart Ratio (Normal <0.45):  0.41  Quantitative Gated Spect Images QGS EDV:  231 ml QGS ESV:  179 ml QGS cine images:  There is global hypokinesis with akinesis of the anterior apical walls. QGS EF: 22%  Impression Exercise Capacity:  Lexiscan with no exercise. BP Response:  Normal blood pressure response. Clinical Symptoms:  No chest pain. ECG Impression:  No significant ST segment change suggestive of ischemia. Comparison with Prior Nuclear Study: No images to compare  Overall Impression:  Abnormal stress nuclear study.  There is evidence of a previous large anterior  apical MI.  There is no evidence of ischemia.  The LV function is severely and globally reduced with akinesis of the anterior apex.  EF = 22%.    Adrian Pace, Adrian Pace., MD, [facility]

## 2011-07-31 ENCOUNTER — Ambulatory Visit (INDEPENDENT_AMBULATORY_CARE_PROVIDER_SITE_OTHER): Payer: Medicare Other | Admitting: Cardiology

## 2011-07-31 ENCOUNTER — Encounter: Payer: Self-pay | Admitting: Cardiology

## 2011-07-31 DIAGNOSIS — I2510 Atherosclerotic heart disease of native coronary artery without angina pectoris: Secondary | ICD-10-CM

## 2011-07-31 DIAGNOSIS — E785 Hyperlipidemia, unspecified: Secondary | ICD-10-CM

## 2011-07-31 DIAGNOSIS — I472 Ventricular tachycardia: Secondary | ICD-10-CM

## 2011-07-31 DIAGNOSIS — I10 Essential (primary) hypertension: Secondary | ICD-10-CM

## 2011-07-31 DIAGNOSIS — I255 Ischemic cardiomyopathy: Secondary | ICD-10-CM

## 2011-07-31 DIAGNOSIS — I2589 Other forms of chronic ischemic heart disease: Secondary | ICD-10-CM

## 2011-07-31 MED ORDER — BENAZEPRIL HCL 10 MG PO TABS: 10.0000 mg | ORAL_TABLET | Freq: Every day | ORAL | Status: DC

## 2011-07-31 NOTE — Assessment & Plan Note (Signed)
Blood pressure controlled. Increase Lotensin for cardiomyopathy.

## 2011-07-31 NOTE — Patient Instructions (Signed)
Your physician wants you to follow-up in: 6 MONTHS You will receive a reminder letter in the mail two months in advance. If you don't receive a letter, please call our office to schedule the follow-up appointment.   INCREASE LOTENSIN TO 30 MG ONCE DAILY  Your physician recommends that you return for lab work in: ONE WEEK

## 2011-07-31 NOTE — Progress Notes (Signed)
HPI: Pleasant male for followup of coronary disease. The patient had his last cardiac catheterization in 2003. He had PCI of his LAD and right coronary artery. His ejection fraction was 30%. Myoview in Sept of 2012 showed EF 22, anteroapical infarct; no ischemia. Since he was last seen he denies dyspnea on exertion, orthopnea, PND, pedal edema, palpitations, syncope or chest pain.   Current Outpatient Prescriptions  Medication Sig Dispense Refill  . aspirin 325 MG tablet Take 325 mg by mouth daily.        . benazepril (LOTENSIN) 20 MG tablet Take 20 mg by mouth daily.        . carvedilol (COREG) 25 MG tablet Take 1 tablet (25 mg total) by mouth 2 (two) times daily with a meal.  180 tablet  3  . omeprazole (PRILOSEC) 20 MG capsule Take 20 mg by mouth daily.        . simvastatin (ZOCOR) 40 MG tablet Take 1 tablet (40 mg total) by mouth at bedtime.  90 tablet  3     Past Medical History  Diagnosis Date  . Ischemic cardiomyopathy   . Renal insufficiency   . Hyperlipemia   . HTN (hypertension)   . Myocardial infarct 1988  . LV dysfunction   . Anxiety   . Panic attacks   . Hyperkalemia   . Ventricular tachycardia   . CHF (congestive heart failure)   . Syncope and collapse     Past Surgical History  Procedure Date  . Cardiac catheterization   . Coronary angioplasty   . Cardiac defibrillator placement   . Hernia repair   . Colonoscopy   . Esophagogastroduodenoscopy     with biopsy    History   Social History  . Marital Status: Married    Spouse Name: N/A    Number of Children: N/A  . Years of Education: N/A   Occupational History  . Not on file.   Social History Main Topics  . Smoking status: Never Smoker   . Smokeless tobacco: Not on file  . Alcohol Use: Yes     occassionally  . Drug Use: No  . Sexually Active: Not on file   Other Topics Concern  . Not on file   Social History Narrative  . No narrative on file    ROS: no fevers or chills, productive cough,  hemoptysis, dysphasia, odynophagia, melena, hematochezia, dysuria, hematuria, rash, seizure activity, orthopnea, PND, pedal edema, claudication. Remaining systems are negative.  Physical Exam: Well-developed well-nourished in no acute distress.  Skin is warm and dry.  HEENT is normal.  Neck is supple. No thyromegaly.  Chest is clear to auscultation with normal expansion.  Cardiovascular exam is regular rate and rhythm.  Abdominal exam nontender or distended. No masses palpated. Extremities show no edema. neuro grossly intact

## 2011-07-31 NOTE — Assessment & Plan Note (Signed)
Continue statin. 

## 2011-07-31 NOTE — Assessment & Plan Note (Signed)
Status post ICD. Management per electrophysiology.

## 2011-07-31 NOTE — Assessment & Plan Note (Signed)
Ejection fraction slightly worse compared to previous. Continue beta blocker. Increase Lotensin to 30 mg daily with ultimate goal of 40 mg daily if tolerated. Check potassium and renal function in one week.

## 2011-07-31 NOTE — Assessment & Plan Note (Signed)
Continue aspirin and statin. 

## 2011-08-07 ENCOUNTER — Ambulatory Visit: Payer: Medicare Other | Admitting: Cardiology

## 2011-08-07 ENCOUNTER — Other Ambulatory Visit (INDEPENDENT_AMBULATORY_CARE_PROVIDER_SITE_OTHER): Payer: Medicare Other | Admitting: *Deleted

## 2011-08-07 DIAGNOSIS — I10 Essential (primary) hypertension: Secondary | ICD-10-CM

## 2011-08-07 DIAGNOSIS — I472 Ventricular tachycardia: Secondary | ICD-10-CM

## 2011-08-07 DIAGNOSIS — I2510 Atherosclerotic heart disease of native coronary artery without angina pectoris: Secondary | ICD-10-CM

## 2011-08-07 DIAGNOSIS — I255 Ischemic cardiomyopathy: Secondary | ICD-10-CM

## 2011-08-07 DIAGNOSIS — E785 Hyperlipidemia, unspecified: Secondary | ICD-10-CM

## 2011-08-07 DIAGNOSIS — I2589 Other forms of chronic ischemic heart disease: Secondary | ICD-10-CM

## 2011-08-07 LAB — BASIC METABOLIC PANEL
BUN: 16 mg/dL (ref 6–23)
CO2: 26 mEq/L (ref 19–32)
Calcium: 8.8 mg/dL (ref 8.4–10.5)
Chloride: 108 mEq/L (ref 96–112)
Creatinine, Ser: 1.4 mg/dL (ref 0.4–1.5)
GFR: 51.01 mL/min — ABNORMAL LOW (ref >60.00)
Glucose, Bld: 99 mg/dL (ref 70–99)
Potassium: 4.2 mEq/L (ref 3.5–5.1)
Sodium: 142 mEq/L (ref 135–145)

## 2011-08-07 LAB — DIFFERENTIAL
Basophils Absolute: 0
Basophils Relative: 0
Eosinophils Relative: 1
Monocytes Absolute: 0.5

## 2011-08-07 LAB — I-STAT 8, (EC8 V) (CONVERTED LAB)
Acid-base deficit: 2
TCO2: 25
pCO2, Ven: 41.1 — ABNORMAL LOW
pH, Ven: 7.365 — ABNORMAL HIGH

## 2011-08-07 LAB — CBC
HCT: 48.3
Hemoglobin: 16.3
MCHC: 33.8
MCV: 87.9
Platelets: 186
RBC: 5.5
RDW: 14.5 — ABNORMAL HIGH
WBC: 10.4

## 2011-08-07 LAB — POCT I-STAT CREATININE: Operator id: 198171

## 2011-09-05 ENCOUNTER — Encounter: Payer: Self-pay | Admitting: Internal Medicine

## 2011-09-05 ENCOUNTER — Ambulatory Visit (INDEPENDENT_AMBULATORY_CARE_PROVIDER_SITE_OTHER): Payer: Medicare Other | Admitting: *Deleted

## 2011-09-05 ENCOUNTER — Other Ambulatory Visit: Payer: Self-pay | Admitting: Internal Medicine

## 2011-09-05 DIAGNOSIS — I472 Ventricular tachycardia: Secondary | ICD-10-CM

## 2011-09-05 DIAGNOSIS — Z95810 Presence of automatic (implantable) cardiac defibrillator: Secondary | ICD-10-CM

## 2011-09-13 NOTE — Progress Notes (Signed)
Remote icd check  

## 2011-09-20 ENCOUNTER — Encounter: Payer: Self-pay | Admitting: *Deleted

## 2011-09-23 ENCOUNTER — Ambulatory Visit: Payer: Medicare Other | Admitting: Cardiology

## 2011-11-05 ENCOUNTER — Encounter (INDEPENDENT_AMBULATORY_CARE_PROVIDER_SITE_OTHER): Payer: Self-pay | Admitting: General Surgery

## 2011-11-05 ENCOUNTER — Ambulatory Visit (INDEPENDENT_AMBULATORY_CARE_PROVIDER_SITE_OTHER): Payer: Medicare Other | Admitting: General Surgery

## 2011-11-05 VITALS — BP 142/82 | HR 79 | Temp 97.4°F | Ht 68.0 in | Wt 180.8 lb

## 2011-11-05 DIAGNOSIS — K429 Umbilical hernia without obstruction or gangrene: Secondary | ICD-10-CM

## 2011-11-05 NOTE — Patient Instructions (Signed)
Plan for cardiac clearance with Dr. Jens Som

## 2011-11-08 ENCOUNTER — Emergency Department (HOSPITAL_COMMUNITY): Payer: Medicare Other

## 2011-11-08 ENCOUNTER — Inpatient Hospital Stay (HOSPITAL_COMMUNITY)
Admission: EM | Admit: 2011-11-08 | Discharge: 2011-11-09 | DRG: 261 | Disposition: A | Discharge status: Home / Self Care | Payer: Medicare Other | Attending: Internal Medicine | Admitting: Internal Medicine

## 2011-11-08 ENCOUNTER — Encounter (HOSPITAL_COMMUNITY): Payer: Self-pay | Admitting: General Practice

## 2011-11-08 ENCOUNTER — Ambulatory Visit (INDEPENDENT_AMBULATORY_CARE_PROVIDER_SITE_OTHER): Payer: Medicare Other | Admitting: *Deleted

## 2011-11-08 ENCOUNTER — Encounter (HOSPITAL_COMMUNITY)
Admission: EM | Disposition: A | Discharge status: Home / Self Care | Payer: Self-pay | Source: Home / Self Care | Attending: Internal Medicine

## 2011-11-08 ENCOUNTER — Other Ambulatory Visit: Payer: Self-pay

## 2011-11-08 ENCOUNTER — Telehealth: Payer: Self-pay | Admitting: *Deleted

## 2011-11-08 ENCOUNTER — Encounter: Payer: Self-pay | Admitting: Internal Medicine

## 2011-11-08 ENCOUNTER — Telehealth: Payer: Self-pay | Admitting: Physician Assistant

## 2011-11-08 ENCOUNTER — Other Ambulatory Visit: Payer: Self-pay | Admitting: Internal Medicine

## 2011-11-08 DIAGNOSIS — I2589 Other forms of chronic ischemic heart disease: Secondary | ICD-10-CM

## 2011-11-08 DIAGNOSIS — F411 Generalized anxiety disorder: Secondary | ICD-10-CM | POA: Diagnosis present

## 2011-11-08 DIAGNOSIS — E785 Hyperlipidemia, unspecified: Secondary | ICD-10-CM

## 2011-11-08 DIAGNOSIS — Y831 Surgical operation with implant of artificial internal device as the cause of abnormal reaction of the patient, or of later complication, without mention of misadventure at the time of the procedure: Secondary | ICD-10-CM | POA: Diagnosis present

## 2011-11-08 DIAGNOSIS — M109 Gout, unspecified: Secondary | ICD-10-CM | POA: Diagnosis present

## 2011-11-08 DIAGNOSIS — I2510 Atherosclerotic heart disease of native coronary artery without angina pectoris: Secondary | ICD-10-CM | POA: Diagnosis present

## 2011-11-08 DIAGNOSIS — Z7982 Long term (current) use of aspirin: Secondary | ICD-10-CM

## 2011-11-08 DIAGNOSIS — T82198A Other mechanical complication of other cardiac electronic device, initial encounter: Secondary | ICD-10-CM

## 2011-11-08 DIAGNOSIS — J40 Bronchitis, not specified as acute or chronic: Secondary | ICD-10-CM | POA: Diagnosis present

## 2011-11-08 DIAGNOSIS — N189 Chronic kidney disease, unspecified: Secondary | ICD-10-CM | POA: Diagnosis present

## 2011-11-08 DIAGNOSIS — I472 Ventricular tachycardia: Secondary | ICD-10-CM | POA: Insufficient documentation

## 2011-11-08 DIAGNOSIS — I129 Hypertensive chronic kidney disease with stage 1 through stage 4 chronic kidney disease, or unspecified chronic kidney disease: Secondary | ICD-10-CM | POA: Diagnosis present

## 2011-11-08 DIAGNOSIS — I255 Ischemic cardiomyopathy: Secondary | ICD-10-CM

## 2011-11-08 DIAGNOSIS — Z9861 Coronary angioplasty status: Secondary | ICD-10-CM

## 2011-11-08 DIAGNOSIS — I10 Essential (primary) hypertension: Secondary | ICD-10-CM

## 2011-11-08 DIAGNOSIS — Y92009 Unspecified place in unspecified non-institutional (private) residence as the place of occurrence of the external cause: Secondary | ICD-10-CM

## 2011-11-08 DIAGNOSIS — Z79899 Other long term (current) drug therapy: Secondary | ICD-10-CM

## 2011-11-08 DIAGNOSIS — I509 Heart failure, unspecified: Secondary | ICD-10-CM | POA: Diagnosis present

## 2011-11-08 DIAGNOSIS — IMO0002 Reserved for concepts with insufficient information to code with codable children: Secondary | ICD-10-CM

## 2011-11-08 DIAGNOSIS — T82190A Other mechanical complication of cardiac electrode, initial encounter: Principal | ICD-10-CM | POA: Diagnosis present

## 2011-11-08 DIAGNOSIS — I5022 Chronic systolic (congestive) heart failure: Secondary | ICD-10-CM | POA: Diagnosis present

## 2011-11-08 DIAGNOSIS — Z95810 Presence of automatic (implantable) cardiac defibrillator: Secondary | ICD-10-CM | POA: Diagnosis present

## 2011-11-08 DIAGNOSIS — I252 Old myocardial infarction: Secondary | ICD-10-CM

## 2011-11-08 HISTORY — PX: LEAD REVISION: SHX5945

## 2011-11-08 LAB — CBC
HCT: 42.5 % (ref 39.0–52.0)
Hemoglobin: 14.3 g/dL (ref 13.0–17.0)
Platelets: 144 10*3/uL — ABNORMAL LOW (ref 150–400)
RBC: 4.83 MIL/uL (ref 4.22–5.81)
RDW: 13.8 % (ref 11.5–15.5)

## 2011-11-08 LAB — BASIC METABOLIC PANEL
BUN: 16 mg/dL (ref 6–23)
Chloride: 106 mEq/L (ref 96–112)
GFR calc Af Amer: 70 mL/min — ABNORMAL LOW (ref >90)
Potassium: 4.1 mEq/L (ref 3.5–5.1)

## 2011-11-08 LAB — PROTIME-INR: Prothrombin Time: 14.6 seconds (ref 11.6–15.2)

## 2011-11-08 SURGERY — LEAD REVISION
Anesthesia: LOCAL

## 2011-11-08 MED ORDER — CEFAZOLIN SODIUM 1-5 GM-% IV SOLN
1.0000 g | Freq: Four times a day (QID) | INTRAVENOUS | Status: AC
  Administered 2011-11-08 – 2011-11-09 (×3): 1 g via INTRAVENOUS
  Filled 2011-11-08 (×4): qty 50

## 2011-11-08 MED ORDER — SODIUM CHLORIDE 0.9 % IR SOLN: 80.0000 mg | Status: DC

## 2011-11-08 MED ORDER — PREDNISONE (PAK) 10 MG PO TABS
10.0000 mg | ORAL_TABLET | Freq: Three times a day (TID) | ORAL | Status: DC
  Administered 2011-11-09 (×2): 10 mg via ORAL

## 2011-11-08 MED ORDER — ACETAMINOPHEN 325 MG PO TABS
325.0000 mg | ORAL_TABLET | ORAL | Status: DC | PRN
  Administered 2011-11-09: 650 mg via ORAL
  Filled 2011-11-08: qty 2

## 2011-11-08 MED ORDER — CEFAZOLIN SODIUM-DEXTROSE 2-3 GM-% IV SOLR: 2.0000 g | INTRAVENOUS | Status: DC

## 2011-11-08 MED ORDER — ACETAMINOPHEN 500 MG PO TABS
1000.0000 mg | ORAL_TABLET | Freq: Four times a day (QID) | ORAL | Status: DC
  Administered 2011-11-08 – 2011-11-09 (×2): 1000 mg via ORAL
  Filled 2011-11-08 (×4): qty 2

## 2011-11-08 MED ORDER — PREDNISONE (PAK) 10 MG PO TABS: 20.0000 mg | ORAL_TABLET | Freq: Every evening | ORAL | Status: DC

## 2011-11-08 MED ORDER — ONDANSETRON HCL 4 MG/2ML IJ SOLN: 4.0000 mg | Freq: Four times a day (QID) | INTRAMUSCULAR | Status: DC | PRN

## 2011-11-08 MED ORDER — CEFAZOLIN SODIUM-DEXTROSE 2-3 GM-% IV SOLR
2.0000 g | Freq: Once | INTRAVENOUS | Status: DC
  Filled 2011-11-08: qty 50

## 2011-11-08 MED ORDER — MIDAZOLAM HCL 5 MG/5ML IJ SOLN
INTRAMUSCULAR | Status: AC
  Filled 2011-11-08: qty 5

## 2011-11-08 MED ORDER — CHLORHEXIDINE GLUCONATE 4 % EX LIQD: 60.0000 mL | Freq: Once | CUTANEOUS | Status: DC

## 2011-11-08 MED ORDER — OFLOXACIN 0.3 % OP SOLN
1.0000 [drp] | Freq: Four times a day (QID) | OPHTHALMIC | Status: DC
  Administered 2011-11-08 – 2011-11-09 (×4): 1 [drp] via OPHTHALMIC
  Filled 2011-11-08 (×2): qty 5

## 2011-11-08 MED ORDER — ASPIRIN 325 MG PO TABS
325.0000 mg | ORAL_TABLET | Freq: Every day | ORAL | Status: DC
  Administered 2011-11-09: 325 mg via ORAL
  Filled 2011-11-08: qty 1

## 2011-11-08 MED ORDER — HYDROCODONE-ACETAMINOPHEN 10-325 MG PO TABS: 1.0000 | ORAL_TABLET | Freq: Four times a day (QID) | ORAL | Status: DC | PRN

## 2011-11-08 MED ORDER — SODIUM CHLORIDE 0.9 % IV SOLN: INTRAVENOUS | Status: DC

## 2011-11-08 MED ORDER — PREDNISONE (PAK) 10 MG PO TABS
10.0000 mg | ORAL_TABLET | Freq: Once | ORAL | Status: AC
  Administered 2011-11-08: 10 mg via ORAL
  Filled 2011-11-08 (×2): qty 21

## 2011-11-08 MED ORDER — HEPARIN (PORCINE) IN NACL 2-0.9 UNIT/ML-% IJ SOLN
INTRAMUSCULAR | Status: AC
  Filled 2011-11-08: qty 1000

## 2011-11-08 MED ORDER — PREDNISONE (PAK) 10 MG PO TABS
10.0000 mg | ORAL_TABLET | Freq: Once | ORAL | Status: AC
  Administered 2011-11-08: 10 mg via ORAL
  Filled 2011-11-08: qty 21

## 2011-11-08 MED ORDER — PANTOPRAZOLE SODIUM 40 MG PO TBEC
40.0000 mg | DELAYED_RELEASE_TABLET | Freq: Every day | ORAL | Status: DC
  Administered 2011-11-08 – 2011-11-09 (×2): 40 mg via ORAL
  Filled 2011-11-08: qty 1

## 2011-11-08 MED ORDER — PREDNISONE (PAK) 10 MG PO TABS: 10.0000 mg | ORAL_TABLET | Freq: Four times a day (QID) | ORAL | Status: DC

## 2011-11-08 MED ORDER — PREDNISONE (PAK) 10 MG PO TABS
20.0000 mg | ORAL_TABLET | Freq: Once | ORAL | Status: AC
  Administered 2011-11-08: 20 mg via ORAL
  Filled 2011-11-08: qty 21

## 2011-11-08 MED ORDER — BENAZEPRIL HCL 10 MG PO TABS: 10.0000 mg | ORAL_TABLET | Freq: Every day | ORAL | Status: DC

## 2011-11-08 MED ORDER — FENTANYL CITRATE 0.05 MG/ML IJ SOLN
INTRAMUSCULAR | Status: AC
  Filled 2011-11-08: qty 2

## 2011-11-08 MED ORDER — CARVEDILOL 25 MG PO TABS
25.0000 mg | ORAL_TABLET | Freq: Two times a day (BID) | ORAL | Status: DC
  Administered 2011-11-08 – 2011-11-09 (×2): 25 mg via ORAL
  Filled 2011-11-08 (×4): qty 1

## 2011-11-08 MED ORDER — ASPIRIN 325 MG PO TABS
325.0000 mg | ORAL_TABLET | Freq: Every day | ORAL | Status: DC
  Filled 2011-11-08: qty 1

## 2011-11-08 MED ORDER — SODIUM CHLORIDE 0.9 % IJ SOLN: 3.0000 mL | INTRAMUSCULAR | Status: DC | PRN

## 2011-11-08 MED ORDER — SODIUM CHLORIDE 0.9 % IV SOLN: 250.0000 mL | INTRAVENOUS | Status: DC | PRN

## 2011-11-08 MED ORDER — PREDNISONE (PAK) 10 MG PO TABS
20.0000 mg | ORAL_TABLET | Freq: Every evening | ORAL | Status: AC
  Administered 2011-11-08: 20 mg via ORAL

## 2011-11-08 MED ORDER — SODIUM CHLORIDE 0.9 % IR SOLN
Freq: Once | Status: DC
  Filled 2011-11-08: qty 2

## 2011-11-08 MED ORDER — BENAZEPRIL HCL 20 MG PO TABS
30.0000 mg | ORAL_TABLET | Freq: Every day | ORAL | Status: DC
  Administered 2011-11-09: 30 mg via ORAL
  Filled 2011-11-08: qty 1.5

## 2011-11-08 MED ORDER — LIDOCAINE HCL (PF) 1 % IJ SOLN
INTRAMUSCULAR | Status: AC
  Filled 2011-11-08: qty 60

## 2011-11-08 MED ORDER — CEFAZOLIN SODIUM 1-5 GM-% IV SOLN
INTRAVENOUS | Status: AC
  Administered 2011-11-08: 1 g via INTRAVENOUS
  Filled 2011-11-08: qty 100

## 2011-11-08 MED ORDER — LORAZEPAM 0.5 MG PO TABS
0.2500 mg | ORAL_TABLET | Freq: Every day | ORAL | Status: DC | PRN
  Administered 2011-11-08: 0.25 mg via ORAL
  Filled 2011-11-08: qty 1

## 2011-11-08 MED ORDER — SODIUM CHLORIDE 0.9 % IJ SOLN
3.0000 mL | Freq: Two times a day (BID) | INTRAMUSCULAR | Status: DC
  Administered 2011-11-08 – 2011-11-09 (×2): 3 mL via INTRAVENOUS

## 2011-11-08 MED ORDER — SIMVASTATIN 40 MG PO TABS
40.0000 mg | ORAL_TABLET | Freq: Every day | ORAL | Status: DC
  Administered 2011-11-08: 40 mg via ORAL
  Filled 2011-11-08 (×2): qty 1

## 2011-11-08 MED ORDER — SODIUM CHLORIDE 0.45 % IV SOLN: INTRAVENOUS | Status: DC

## 2011-11-08 MED ORDER — ALLOPURINOL 100 MG PO TABS
100.0000 mg | ORAL_TABLET | Freq: Every day | ORAL | Status: DC
  Administered 2011-11-08 – 2011-11-09 (×2): 100 mg via ORAL
  Filled 2011-11-08 (×2): qty 1

## 2011-11-08 NOTE — Progress Notes (Signed)
RN called because pt has recent Hx gout and completed a steroid taper. He is developing Sx again with erythema and edema of foot, associated with significant pain. He is on pain Rx, so another steroid taper and allopurinol were added. F/U with primary MD.

## 2011-11-08 NOTE — H&P (Signed)
History and Physical  Patient ID: Adrian Pace MRN: [id number], SOB: 1933/06/20 78 y.o. Date of Encounter: 11/08/2011, 10:28 AM  Primary Physician: Adrian Divine, MD, MD Primary Cardiologist: Adrian Millers MD/ Adrian Bunting MD  Chief Complaint: ICD making noises  HPI: 78 y.o. male w/ PMHx significant for HLD, HTN, CKD, CAD (s/p PCI to LAD & RCA in '03), Systolic CHF, ICM (EF 22% by myoview sept '12), Paroxysmal VT (s/p Medtronic ICD in '05) who presented to [facility] on 11/08/2011 with complaints of ICD making noises.  The patient called the answering service this morning regarding a noise coming from his ICD that was implanted in 2005. He was watching TV this morning when he first noticed a "warbling sound" like 4 notes of a musical tone that repeated several minutes later. He does not think he got shocked. He denies any CP, SOB, palpitations, dizziness, orthopnea, edema, pre-syncope or syncope, fever, chills. He has been battling bronchitis, pinkeye, and gout but from a cardiac standpoint, he feels like he's been doing well. He was instructed to send a transmission in to the office. Transmission was received and interpreted by Adrian [facility] with [facility] who found that the device alerted for high lead impedance. Per her note "Pt has 6949 lead. SIC 65. Pt feels fine. After speaking with Adrian Pace, pt was instructed to be driven to ER. Pt's wife is going to drive him."  In the ED he continues to be without any cardiac complaints. His EKG is without any acute changes.  Diagnosis  . Ischemic cardiomyopathy  . Renal insufficiency  . Hyperlipemia  . HTN (hypertension)  . Myocardial infarct           (1998)  . LV dysfunction  . Anxiety  . Panic attacks  . Hyperkalemia  . Ventricular tachycardia  . CHF (congestive heart failure)  . Syncope and collapse    Surgical History:  Procedure  . Cardiac catheterization  . Coronary angioplasty  . Cardiac  defibrillator placement  . Hernia repair  . Colonoscopy  . Esophagogastroduodenoscopy with biopsy    Home Meds: Medication Sig  aspirin 325 MG tablet Take 325 mg by mouth daily.    benazepril (LOTENSIN) 10 MG tablet Take 1 tablet (10 mg total) by mouth daily.  benazepril (LOTENSIN) 20 MG tablet Take 20 mg by mouth daily. Take with 10mg  tablet for total of 30mg  daily  carvedilol (COREG) 25 MG tablet Take 1 tablet (25 mg total) by mouth 2 (two) times daily with a meal.  HYDROcodone-acetaminophen (NORCO) 10-325 MG per tablet Take 1 tablet by mouth every 6 (six) hours as needed. For pain  omeprazole (PRILOSEC) 20 MG capsule Take 20 mg by mouth daily.    simvastatin (ZOCOR) 40 MG tablet Take 1 tablet (40 mg total) by mouth at bedtime.  LORazepam (ATIVAN) 0.5 MG tablet Take 0.25 mg by mouth daily as needed. For anxiety   Allergies:  Allergen Reactions  . Codeine Other (See Comments)    Causes anxiety   Social History  . Marital Status: Married    Social History Main Topics  . Smoking status: Never Smoker   . Alcohol Use: Yes     occassionally  . Drug Use: No   Problem Relation  . Heart attack Father    x2  . Heart disease Father    Review of Systems: General: negative for chills, fever, night sweats or weight changes.  Cardiovascular: negative for chest pain, dyspnea on exertion, edema, orthopnea,  palpitations, paroxysmal nocturnal dyspnea or shortness of breath Dermatological: negative for rash Respiratory: negative for cough or wheezing Urologic: negative for hematuria Abdominal: negative for nausea, vomiting, diarrhea, bright red blood per rectum, melena, or hematemesis Neurologic: negative for visual changes, syncope, or dizziness MSK: Mild swelling/pain dorsal left foot All other systems reviewed and are otherwise negative except as noted above.  Labs: BMET, CBC, Coags - Pending  Radiology/Studies:  CXR Pending   EKG: 11/08/11 @ 0945 - EKG sinus rhythm w/ PACs,  82bpm, No acute changes  Physical Exam: Blood pressure 155/80, pulse 72, temperature 98.2 F (36.8 C), temperature source Oral, resp. rate 18, SpO2 98.00%. General: Well developed, well nourished, white male in no acute distress. Head: Normocephalic, atraumatic, sclera non-icteric, nares are without discharge Neck: Supple. Negative for carotid bruits. JVD not elevated. Lungs: Clear bilaterally to auscultation without wheezes, rales, or rhonchi. Breathing is unlabored. Heart: RRR with ectopy. Nl S1 S2. No murmurs, rubs, or gallops appreciated. Abdomen: Soft, non-tender, non-distended with normoactive bowel sounds. No rebound/guarding. No obvious abdominal masses. Msk:  Strength and tone appear normal for age. Extremities: Trace bilat LE edema. No clubbing or cyanosis. Distal pedal pulses are 2+ and equal bilaterally. Neuro: Alert and oriented X 3. Moves all extremities spontaneously. Psych:  Responds to questions appropriately with a normal affect.    ASSESSMENT AND PLAN:   78 y.o. male w/ PMHx significant for HLD, HTN, CKD, CAD (s/p PCI to LAD & RCA in '03), Systolic CHF2/2 ICM (EF 22% by myoview sept '12), Paroxysmal VT (s/p Medtronic ICD in '05) who presented to [facility] on 11/08/2011 with complaints of ICD making noises.   1. ICD Lead malfunction: After interrogation it was suspected his RV lead is not functioning properly. He is asymptomatic and denies receiving a shock. Labs pending. Will admit with plans for ICD lead revision.  2. CAD: He denies chest pain or other cardiac symptoms. His EKG is nonacute. Cont ASA, BB, statin  3. Systolic CHF 2/2 ICM: Volume status stable. Cont home meds  4. CKD: Creatinine pending. Will monitor renal function  5. Hyperlipidemia: Cont statin  6. Hypertension: Stable. Cont home meds.  Signed, Adrian Pace, JESSICA PA-C 11/08/2011, 10:28 AM   I have seen, examined the patient, and reviewed the above assessment and plan.  Briefly, Adrian Pace is  a pleasant 78 yo WM with a h/o ischemic CM and prior ICD implantation 2005 for which he has received appropriate ICD shock therapy for VT in the past.  He now presents with a sprint fidelis lead fracture.  Fortunately, he has not received any inappropriate ICD shocks.  I have reviewed his ICD interrogation and reprogrammed with therapies off at this time.  We will admit to [facility].  Exam is stable and as noted above.  Risks, benefits, alternatives to ICD system revision with new RV lead placement were discussed in detail with the patient today. The patient  understands that the risks include but are not limited to bleeding, infection, pneumothorax, perforation, tamponade, vascular damage, renal failure, MI, stroke, death, inappropriate shocks, and lead dislodgement and wishes to proceed.  We will therefore schedule device implantation later today.   Co Sign: Hillis Range, MD 11/08/2011 12:29 PM

## 2011-11-08 NOTE — ED Provider Notes (Signed)
History     CSN: [id number]  Arrival date & time 11/08/11  4098   First MD Initiated Contact with Patient 11/08/11 1004      Chief Complaint  Patient presents with  . AICD Problem    (Consider location/radiation/quality/duration/timing/severity/associated sxs/prior treatment) HPI... patient heard noise coming from AICD this morning.  Otherwise he feels fine. No chest pain or shortness of breath. He called the [facility] cardiology group who recommended he come to the emergency department to get his leads checked. Nothing makes his concerns better or worse.  Past Medical History  Diagnosis Date  . Ischemic cardiomyopathy   . Renal insufficiency   . Hyperlipemia   . HTN (hypertension)   . Myocardial infarct 1988  . LV dysfunction   . Anxiety   . Panic attacks   . Hyperkalemia   . Ventricular tachycardia   . CHF (congestive heart failure)   . Syncope and collapse   . Heart attack     Past Surgical History  Procedure Date  . Cardiac catheterization   . Coronary angioplasty   . Cardiac defibrillator placement   . Hernia repair   . Colonoscopy   . Esophagogastroduodenoscopy     with biopsy    Family History  Problem Relation Age of Onset  . Heart attack Father     x2  . Heart disease Father     History  Substance Use Topics  . Smoking status: Never Smoker   . Smokeless tobacco: Not on file  . Alcohol Use: Yes     occassionally      Review of Systems  All other systems reviewed and are negative.    Allergies  Codeine  Home Medications   Current Outpatient Rx  Name Route Sig Dispense Refill  . ASPIRIN 325 MG PO TABS Oral Take 325 mg by mouth daily.      Marland Kitchen BENAZEPRIL HCL 10 MG PO TABS Oral Take 1 tablet (10 mg total) by mouth daily. 30 tablet 11  . BENAZEPRIL HCL 20 MG PO TABS Oral Take 20 mg by mouth daily. Take with 10mg  tablet for total of 30mg  daily    . CARVEDILOL 25 MG PO TABS Oral Take 1 tablet (25 mg total) by mouth 2 (two) times daily with a  meal. 180 tablet 3  . HYDROCODONE-ACETAMINOPHEN 10-325 MG PO TABS Oral Take 1 tablet by mouth every 6 (six) hours as needed. For pain    . OMEPRAZOLE 20 MG PO CPDR Oral Take 20 mg by mouth daily.      Marland Kitchen SIMVASTATIN 40 MG PO TABS Oral Take 1 tablet (40 mg total) by mouth at bedtime. 90 tablet 3  . LORAZEPAM 0.5 MG PO TABS Oral Take 0.25 mg by mouth daily as needed. For anxiety      BP 155/80  Pulse 72  Temp(Src) 98.2 F (36.8 C) (Oral)  Resp 18  SpO2 98%  Physical Exam  Nursing note and vitals reviewed. Constitutional: He is oriented to person, place, and time. He appears well-developed and well-nourished.  HENT:  Head: Normocephalic and atraumatic.  Eyes: Conjunctivae and EOM are normal. Pupils are equal, round, and reactive to light.  Neck: Normal range of motion. Neck supple.  Cardiovascular: Normal rate and regular rhythm.   Pulmonary/Chest: Effort normal and breath sounds normal.  Abdominal: Soft. Bowel sounds are normal.  Musculoskeletal: Normal range of motion.  Neurological: He is alert and oriented to person, place, and time.  Skin: Skin is warm and dry.  Psychiatric: He has a normal mood and affect.    ED Course  Procedures (including critical care time)   Labs Reviewed  BASIC METABOLIC PANEL  CBC  PROTIME-INR  APTT   No results found.   1. CAD (coronary artery disease)   2. Ischemic cardiomyopathy   3. DYSLIPIDEMIA   4. HYPERTENSION   5. VENTRICULAR TACHYCARDIA       MDM  Patient is stable. Will notify cardiologist to assess AICD        Donnetta Hutching, MD 11/08/11 1135

## 2011-11-08 NOTE — ED Notes (Signed)
Pt transported to the cath lab for an AICD replacement

## 2011-11-08 NOTE — Progress Notes (Signed)
Doing well s/p ICD lead revision  Will plan to discharge in am. Wound check in 10 days, follow-up with Dr Ladona Ridgel in 3 months Routine wound care.

## 2011-11-08 NOTE — Op Note (Signed)
SURGEON:  Hillis Range, MD      PREPROCEDURE DIAGNOSES:   1. Ischemic cardiomyopathy.   2. New York Heart Association class III, heart failure chronically.   3. History of ventricular fibrillation  4. Sprint Fidelis Lead Fracture     POSTPROCEDURE DIAGNOSES:  same     PROCEDURES:    1. Left upper extremity venography  2. ICD system revision with placement of a new right ventricular ICD defibrillator lead, removal of the previously implanted generator and replacement of the generator.  3. Defibrillation threshold testing     INTRODUCTION: Adrian Pace is a 78 y.o. male with an ischemic CM, NYHA Class III CHF, and prior ventricular fibrillation for which he has received appropriate ICD therapy in the past.  He now presents with a Sprint Fidelis lead fracture.  The patient has a narrow QRS and does not meet criteria for revascularization.  The patient therefore  presents today for ICD system revision with new RV lead placement.      DESCRIPTION OF PROCEDURE:  Informed written consent was obtained and the patient was brought to the electrophysiology lab in the fasting state. The patient was adequately sedated with intravenous Versed, and fentanyl as outlined in the nursing report. The patients existing ICD was interrogated and confirmed sprint fidelis lead fracture as evidence by acute elevation in pace/sense lead impedance and elevated SIC counter.  No recent ICD shocks have been delivered, though he has previously received and ICD shock for VF as above.  His therapies were programmed off.  The patient's left chest was prepped and draped in the usual sterile fashion by the EP lab staff.  The skin overlying the left deltopectoral region was infiltrated with lidocaine for local analgesia.  A 5-cm incision was made over the left deltopectoral region.   Electrocautery was used to assure hemostasis.  His existing defibrillator was removed from the pocket and disconnected from the previously implanted  fidelis lead.  End tip caps were placed over the IS1 as well as SVC and RV pins.  The lead was then returned to the pocket.  Left Upper extremity Venography:  A venogram of the left upper extremity was performed which revealed a patent and large sized left axillary vein which emptied into a large sized left subclavian vein.  There was no evidence of subclavian vein stenosis.  RA/RV Lead Placement: The left axillary vein was cannulated with fluoroscopic visualization.  Through the left axillary vein, a Medtronic model [id number] (serial number [id number]) right ventricular defibrillator lead were advanced with fluoroscopic visualization into the right ventricular apical septal position.  Initial right ventricular lead R-wave measured 13.5 mV with impedance of 678 ohms and a threshold of 0.6 volts at 0.5 milliseconds.   The lead were secured to the pectoralis  fascia using #2 silk suture over the suture sleeve.  The pocket then  irrigated with copious gentamicin solution.  The leads were then connected to a Medtronic Protecta XT (serial  Number [id number]) ICD.  The pocket was then revised to accommodate this new device.  The defibrillator was placed into the  pocket.  The pocket was then closed in 2 layers with 2.0 Vicryl suture  for the subcutaneous and subcuticular layers.  Steri-Strips and a  sterile dressing were then applied.   DFT Testing: Defibrillation Threshold testing was then performed. Ventricular fibrillation was induced with a T shock.  Adequate sensing of ventricular  fibrillation was observed with minimal dropout with a programmed sensitivity of 1.103mV.  The  patient was successfully defibrillated to sinus rhythm with a single 15 joules shock delivered from the device with an impedance of 64 ohms in a duration of 7 seconds.  The patient remained in sinus rhythm thereafter.  There were no early apparent complications.  Programmed Extrastimulus testing:  Programmed extrastimulus testing was  performed through the device with a basic cycle length of with S1,S2,S3,S4 extrastimuli down to refractoriness (500/270/240/200 msec) with no sustained VT or VF observed.  The procedure was therefore considered completed.  There were no early apparhent complications.     CONCLUSIONS:   1. Successful ICD system revision with a new RV lead implanted following fidelis lead fracture.   3. DFT less than or equal to 15 joules.   4. No inducible VT or VF with PES  5. No early apparent complications.   Adrian Finnie,MD 3:03 PM 11/08/2011

## 2011-11-08 NOTE — ED Notes (Signed)
icd making noises; called labauer and told lead recall problem; the icd did not fire, but possibility.

## 2011-11-08 NOTE — Brief Op Note (Signed)
11/08/2011  2:47 PM  PATIENT:  Adrian Pace  78 y.o. male  PRE-OPERATIVE DIAGNOSIS:  fractured lead  POST-OPERATIVE DIAGNOSIS:  * No post-op diagnosis entered *  PROCEDURE:  Procedure(s): LEAD REVISION  SURGEON:  Surgeon(s): Gardiner Rhyme, MD  PHYSICIAN ASSISTANT:   ASSISTANTS: none   ANESTHESIA:   IV sedation  EBL:     BLOOD ADMINISTERED:none  DRAINS: none   LOCAL MEDICATIONS USED:  LIDOCAINE 7CC  SPECIMEN:  No Specimen  DISPOSITION OF SPECIMEN:  N/A  COUNTS:  YES  TOURNIQUET:  * No tourniquets in log *  DICTATION: .Note written in EPIC  PLAN OF CARE: Admit to inpatient   PATIENT DISPOSITION:  PACU - hemodynamically stable.   Delay start of Pharmacological VTE agent (>24hrs) due to surgical blood loss or risk of bleeding:  {YES/NO/NOT APPLICABLE:20182

## 2011-11-08 NOTE — ED Notes (Signed)
MD at bedside.  [place] PA

## 2011-11-08 NOTE — Telephone Encounter (Signed)
Transmission was received and pt was alerted for high lead impedance. Pt has 6949 lead. SIC 65. Pt feels fine. After speaking with Dr Graciela Husbands, pt was instructed to be driven to ER. Pt's wife is going to drive him. Daun Peacock was called and made aware that pt was on way to ER. Sent Dr Johney Frame a text also letting know of situation.

## 2011-11-08 NOTE — ED Notes (Signed)
Patient denies pain and is resting comfortably. Trish, Card Master called and will be down shortly to see the patient

## 2011-11-08 NOTE — Telephone Encounter (Signed)
The patient called answering service this morning regarding a noise coming from his ICD that was implanted in 2005. He was watching TV when he first noticed a "warbling sound" like 4 notes of a musical tone that repeated several minutes later. He does not think he got shocked. He denies any CP, SOB, dizziness, pre-syncope or syncope. He has been battling the flu and pinkeye but from a cardiac standpoint, he feels like he's been doing well. I instructed him to send a transmission in to the office - he has a Medtronic device. I spoke with Dr. Johney Frame and with his instruction will be contacting the office device staff. The patient is aware that we will be contacting him.  Telia Amundson PA-C

## 2011-11-09 ENCOUNTER — Inpatient Hospital Stay (HOSPITAL_COMMUNITY): Payer: Medicare Other

## 2011-11-09 ENCOUNTER — Other Ambulatory Visit: Payer: Self-pay

## 2011-11-09 MED ORDER — ALLOPURINOL 100 MG PO TABS: 100.0000 mg | ORAL_TABLET | Freq: Every day | ORAL | Status: DC

## 2011-11-09 MED ORDER — PREDNISONE (PAK) 10 MG PO TABS: ORAL_TABLET | ORAL | Status: DC

## 2011-11-09 MED ORDER — DIPHENHYDRAMINE HCL 25 MG PO CAPS
25.0000 mg | ORAL_CAPSULE | Freq: Once | ORAL | Status: AC
  Administered 2011-11-09: 25 mg via ORAL
  Filled 2011-11-09: qty 1

## 2011-11-09 MED ORDER — OFLOXACIN 0.3 % OP SOLN: 1.0000 [drp] | Freq: Four times a day (QID) | OPHTHALMIC | Status: AC

## 2011-11-09 MED ORDER — PANTOPRAZOLE SODIUM 40 MG PO TBEC: 40.0000 mg | DELAYED_RELEASE_TABLET | Freq: Every day | ORAL | Status: DC

## 2011-11-09 NOTE — Progress Notes (Signed)
Subjective: No CP  No SOB Objective: Filed Vitals:   11/08/11 1545 11/08/11 2130 11/09/11 0100 11/09/11 0540  BP: 101/77 116/60  145/83  Pulse: 69 58  68  Temp: 98 F (36.7 C) 97.7 F (36.5 C)  98.2 F (36.8 C)  TempSrc: Oral     Resp: 18 16  14   Height:   5\' 8"  (1.727 m)   Weight:   180 lb 12.4 oz (82 kg)   SpO2: 95% 93%  93%   Weight change:   Intake/Output Summary (Last 24 hours) at 11/09/11 1152 Last data filed at 11/09/11 0545  Gross per 24 hour  Intake    200 ml  Output   1050 ml  Net   -850 ml    General: Alert, awake, oriented x3, in no acute distress Neck:  JVP is normal L chest:  Dressing dry. Heart: Regular rate and rhythm, without murmurs, rubs, gallops.  Lungs: Clear to auscultation.  No rales or wheezes. Exemities:  No edema.   Neuro: Grossly intact, nonfocal.   Lab Results: No results found for this or any previous visit (from the past 24 hour(s)).  Studies/Results: Dg Chest 2 View  11/09/2011  *RADIOLOGY REPORT*  Clinical Data: Status post ICD placement.  CHEST - 2 VIEW  Comparison: 1 day prior  Findings: Lateral view degraded by patient arm position.  Pacer / AICD device.  Both leads terminate at the right ventricle. No lead discontinuity.  Midline trachea.  Normal heart size and mediastinal contours for age.  No pleural effusion or pneumothorax.  Volume loss at the right lung base is not significantly changed.  IMPRESSION:  1.  Replacement or placement of a pacer / AICD device. Appropriately positioned, without pneumothorax. 2.  Similar volume loss at the right lung base.  Favored to represent an area of scarring.  If there are acute or chronic cardiopulmonary symptoms, plain film follow-up at 3 months versus further characterization with chest CT could be performed.  This is new since 12/30/2007.  Original Report Authenticated By: Consuello Bossier, M.D.   X-ray Chest Pa And Lateral  11/08/2011  *RADIOLOGY REPORT*  Clinical Data: Pre ICD revision.  History  of ICD lead fracture.  CHEST - 2 VIEW  Comparison: 12/30/2007  Findings: No significant change in the appearance of the left cardiac ICD.  There are increased densities at the right lung base which are concerning for elevated of the right hemidiaphragm. Cannot exclude airspace disease or volume loss at the right lung base.  A focal opacity is not well demonstrated on the lateral view.  The left lung is clear.  Heart size is stable.  IMPRESSION: Concern for volume loss and possibly airspace disease in the anterior right lung base.  Recommend follow-up exam to ensure resolution.  Original Report Authenticated By: Richarda Overlie, M.D.    Medications: I have reviewed the patient's current medications.   Patient Active Hospital Problem List: Ischemic cardiomyopathy   Assessment: VOlume status looks good.   Plan:  ICD (implantable cardiac defibrillator) lead failure (11/08/2011)   Assessment: S/p revision.  D/C today.  Wound check in 10 days.  F/U [facility]ette [facility]al in 3 months.   Plan:  Dyslipidemai:  Keep on statin Gout:  Steroid and allopurinol HTN:  Keep on same meds.   LOS: 1 day   Dietrich Pates 11/09/2011, 11:52 AM

## 2011-11-09 NOTE — Discharge Summary (Signed)
Physician Discharge Summary  Patient ID: Adrian Pace MRN: [id number] DOB/AGE: Mar 28, 1933 78 y.o.  Admit date: 11/08/2011 Discharge date: 11/09/2011  Primary Discharge Diagnosis:  1.Pacemaker Lead Fracture-Sprint Fidelis Lead  2. S/P ICD system revision with placement of a new right ventricular ICD defibrillator lead, removal of the previously implanted generator and replacement of the generator.  Secondary Discharge Diagnosis 1. Ischemic Cardiomyopathy 2. New [place] Heart Association Class lll heart failure chronically 3. Hypertension 4. CAD s/p PCI to LAD & RCA 2003 5. Bronchitis on steroids 6. Renal Insuffiency  Significant Procedures:11/08/2011 1. Left upper extremity venography  2. ICD system revision with placement of a new right ventricular ICD defibrillator lead, removal of the     previously implanted generator and replacement of the generator.  3. Defibrillation threshold testing 4. Sucessful ICD system revision with a new RV lead implanted following fidelis lead fracture.  5. DFT less than or equal to 15 joules.  6. No inducible VT or VF with PES  7. No early apparent complications.  8. Medtronic Protecta XT (serial Number [id number]) ICD  Hospital Course: Mr. Carbajal is a 78 year old patient of Dr. Hillis Range and Dr. Olga Millers. He was admitted for complaints of his ICD making a "gurgling sound" like for note of a musical tone there repeated several minutes later. He had no ICD shocks. He said his transmission of his ICD to the office and was found to have a device alert of high lead impedance. "Patient has 6949-lead, SICU 59." The patient was brought to the emergency room without any complaints other than the sound coming from his ICD pacemaker.    He was seen and examined by Dr. Hillis Range who took the patient to OR where he replaced the ICD generator was a Medtronic Protecta XT, and placement of a new right ventricular ICD lead, secondary to lead fracture. The patient  tolerated the procedure well without any bleeding or signs of infection. He was seen and examined by Dr. Huston Foley on day of discharge and was doing well. He was sent home on medications he was taking prior to admission with no changes. He will follow up in the pacemaker clinic for wound check in 10 days and with Dr. Ladona Ridgel in 3 months. With continuation pacemaker checks as scheduled. He was given post-pacemaker instructions along with activities post pacemaker implantation in a written form on discharge. Discharge Exam: Blood pressure 145/83, pulse 68, temperature 98.2 F (36.8 C), temperature source Oral, resp. rate 14, height 5\' 8"  (1.727 m), weight 180 lb 12.4 oz (82 kg), SpO2 93.00%.Labs:   Lab Results  Component Value Date   WBC 11.0* 11/08/2011   HGB 14.3 11/08/2011   HCT 42.5 11/08/2011   MCV 88.0 11/08/2011   PLT 144* 11/08/2011    Lab 11/08/11 1118  NA 140  K 4.1  CL 106  CO2 27  BUN 16  CREATININE 1.14  CALCIUM 8.4  PROT --  BILITOT --  ALKPHOS --  ALT --  AST --  GLUCOSE 85   No results found for this basename: CKTOTAL, CKMB, CKMBINDEX, TROPONINI    Lab Results  Component Value Date   CHOL 102 07/09/2011   CHOL 118 01/16/2011   CHOL 118 05/08/2010   Lab Results  Component Value Date   HDL 31.10* 07/09/2011   HDL 31* 01/16/2011   HDL 34* 05/08/2010   Lab Results  Component Value Date   LDLCALC 56 07/09/2011   LDLCALC 69 01/16/2011   LDLCALC  70 05/08/2010   Lab Results  Component Value Date   TRIG 76.0 07/09/2011   TRIG 88 01/16/2011   TRIG 78 05/08/2010   Lab Results  Component Value Date   CHOLHDL 3 07/09/2011   CHOLHDL 3.8 01/16/2011   No results found for this basename: LDLDIRECT      Radiology: Dg Chest 2 View  11/09/2011  *RADIOLOGY REPORT*  Clinical Data: Status post ICD placement.  CHEST - 2 VIEW  Comparison: 1 day prior  Findings: Lateral view degraded by patient arm position.  Pacer / AICD device.  Both leads terminate at the right ventricle. No lead  discontinuity.  Midline trachea.  Normal heart size and mediastinal contours for age.  No pleural effusion or pneumothorax.  Volume loss at the right lung base is not significantly changed.  IMPRESSION:  1.  Replacement or placement of a pacer / AICD device. Appropriately positioned, without pneumothorax. 2.  Similar volume loss at the right lung base.  Favored to represent an area of scarring.  If there are acute or chronic cardiopulmonary symptoms, plain film follow-up at 3 months versus further characterization with chest CT could be performed.  This is new since 12/30/2007.  Original Report Authenticated By: Consuello Bossier, M.D.   X-ray Chest Pa And Lateral  11/08/2011  *RADIOLOGY REPORT*  Clinical Data: Pre ICD revision.  History of ICD lead fracture.  CHEST - 2 VIEW  Comparison: 12/30/2007  Findings: No significant change in the appearance of the left cardiac ICD.  There are increased densities at the right lung base which are concerning for elevated of the right hemidiaphragm. Cannot exclude airspace disease or volume loss at the right lung base.  A focal opacity is not well demonstrated on the lateral view.  The left lung is clear.  Heart size is stable.  IMPRESSION: Concern for volume loss and possibly airspace disease in the anterior right lung base.  Recommend follow-up exam to ensure resolution.  Original Report Authenticated By: Richarda Overlie, M.D.      FOLLOW UP PLANS AND APPOINTMENTS Discharge Orders    Future Appointments: Provider: Department: Dept Phone: Center:   12/12/2011 8:05 AM Amber Caryl Bis, RN [facility] [phone] [id number]     Current Discharge Medication List    START taking these medications   Details  allopurinol (ZYLOPRIM) 100 MG tablet Take 1 tablet (100 mg total) by mouth daily. Qty: 30 tablet, Refills: 1    ofloxacin (OCUFLOX) 0.3 % ophthalmic solution Place 1 drop into the left eye 4 (four) times daily. Qty: 5 mL, Refills: 1    pantoprazole  (PROTONIX) 40 MG tablet Take 1 tablet (40 mg total) by mouth daily at 12 noon. Qty: 30 tablet, Refills: 6    predniSONE (STERAPRED UNI-Pace) 10 MG tablet 10 mg 4 times a day for 10 days and then stop Qty: 40 tablet, Refills: 0      CONTINUE these medications which have NOT CHANGED   Details  aspirin 325 MG tablet Take 325 mg by mouth daily.      benazepril (LOTENSIN) 20 MG tablet Take 20 mg by mouth daily. Take with 10mg  tablet for total of 30mg  daily    carvedilol (COREG) 25 MG tablet Take 1 tablet (25 mg total) by mouth 2 (two) times daily with a meal. Qty: 180 tablet, Refills: 3    HYDROcodone-acetaminophen (NORCO) 10-325 MG per tablet Take 1 tablet by mouth every 6 (six) hours as needed. For pain  simvastatin (ZOCOR) 40 MG tablet Take 1 tablet (40 mg total) by mouth at bedtime. Qty: 90 tablet, Refills: 3    LORazepam (ATIVAN) 0.5 MG tablet Take 0.25 mg by mouth daily as needed. For anxiety      STOP taking these medications     omeprazole (PRILOSEC) 20 MG capsule        Follow-up Information    Follow up with Astrid Divine, MD .           Time spent with patient to include physician time:45 minutes Signed: Joni Reining 11/09/2011, 3:06 PM Co-Sign MD

## 2011-11-10 LAB — REMOTE ICD DEVICE
DEV-0020ICD: NEGATIVE
RV LEAD IMPEDENCE ICD: 1696 Ohm
TOT-0006: 20121108000000
TZAT-0001SLOWVT: 1
TZAT-0001SLOWVT: 2
TZAT-0004FASTVT: 8
TZAT-0004SLOWVT: 8
TZAT-0004SLOWVT: 8
TZAT-0005FASTVT: 88 pct
TZAT-0005SLOWVT: 84 pct
TZAT-0005SLOWVT: 91 pct
TZAT-0011FASTVT: 10 ms
TZAT-0011SLOWVT: 10 ms
TZAT-0011SLOWVT: 10 ms
TZAT-0012FASTVT: 200 ms
TZAT-0013SLOWVT: 2
TZAT-0018SLOWVT: NEGATIVE
TZAT-0018SLOWVT: NEGATIVE
TZAT-0019FASTVT: 8 V
TZON-0003FASTVT: 240 ms
TZON-0004SLOWVT: 20
TZON-0005SLOWVT: 12
TZON-0008FASTVT: 0 ms
TZON-0010AFLUTTER: 50 ms
TZON-0011AFLUTTER: 70
TZST-0001FASTVT: 3
TZST-0001FASTVT: 5
TZST-0001SLOWVT: 3
TZST-0001SLOWVT: 4
TZST-0001SLOWVT: 6
TZST-0003FASTVT: 20 J
TZST-0003FASTVT: 35 J
TZST-0003FASTVT: 35 J
TZST-0003SLOWVT: 35 J

## 2011-11-12 ENCOUNTER — Encounter (INDEPENDENT_AMBULATORY_CARE_PROVIDER_SITE_OTHER): Payer: Self-pay | Admitting: General Surgery

## 2011-11-12 NOTE — Progress Notes (Signed)
Subjective:     Patient ID: Adrian Pace, male   DOB: 1932-11-23, 78 y.o.   MRN: [id number]  HPI The patient is a 78 her old white male who is actually the husband of one of our breast cancer patients. A last several months he has been noticing some bulging of his bellybutton. He has experienced some intermittent discomfort associated with it. He denies any nausea or vomiting or obstructive symptoms. He denies any fevers or chills he does have a history of coronary artery disease and also has a defibrillator. He is followed by Dr. Jens Som.  Review of Systems  Constitutional: Negative.   HENT: Negative.   Eyes: Negative.   Respiratory: Negative.   Cardiovascular: Negative.   Gastrointestinal: Positive for abdominal pain.  Genitourinary: Negative.   Musculoskeletal: Negative.   Skin: Negative.   Neurological: Negative.   Hematological: Negative.   Psychiatric/Behavioral: Negative.        Objective:   Physical Exam  Constitutional: He is oriented to person, place, and time. He appears well-developed and well-nourished.  HENT:  Head: Normocephalic and atraumatic.  Eyes: Conjunctivae and EOM are normal. Pupils are equal, round, and reactive to light.  Neck: Normal range of motion. Neck supple.  Cardiovascular: Normal rate, regular rhythm and normal heart sounds.        Defibrillator in place  Pulmonary/Chest: Effort normal and breath sounds normal.  Abdominal: Soft. Bowel sounds are normal.       Moderate sized umbilical hernia that reduces partially. No abdominal distention or abdominal pain.  Musculoskeletal: Normal range of motion.  Neurological: He is alert and oriented to person, place, and time.  Skin: Skin is warm and dry.  Psychiatric: He has a normal mood and affect. His behavior is normal.       Assessment:     Symptomatic umbilical hernia    Plan:     Because of the risk of incarceration strangulation I think he would benefit from having the hernia fixed. He  would also like to have this done. I have discussed with him in detail the risks and benefits of the operation fix the hernia as well as some of the technical aspects including the use of mesh and he understands and wishes to proceed. We will obtain cardiac clearance from Dr. Jens Som to make sure it safe to put him to sleep prior to scheduling surgery.

## 2011-11-13 ENCOUNTER — Other Ambulatory Visit (INDEPENDENT_AMBULATORY_CARE_PROVIDER_SITE_OTHER): Payer: Self-pay | Admitting: General Surgery

## 2011-11-13 NOTE — Progress Notes (Signed)
ICD remote 

## 2011-11-20 ENCOUNTER — Ambulatory Visit (INDEPENDENT_AMBULATORY_CARE_PROVIDER_SITE_OTHER): Payer: Medicare Other | Admitting: *Deleted

## 2011-11-20 ENCOUNTER — Encounter: Payer: Self-pay | Admitting: Internal Medicine

## 2011-11-20 ENCOUNTER — Telehealth (INDEPENDENT_AMBULATORY_CARE_PROVIDER_SITE_OTHER): Payer: Self-pay | Admitting: General Surgery

## 2011-11-20 DIAGNOSIS — I255 Ischemic cardiomyopathy: Secondary | ICD-10-CM

## 2011-11-20 DIAGNOSIS — I2589 Other forms of chronic ischemic heart disease: Secondary | ICD-10-CM

## 2011-11-20 LAB — ICD DEVICE OBSERVATION
BATTERY VOLTAGE: 3.22 V
BRDY-0002RV: 40 {beats}/min
RV LEAD AMPLITUDE: 8.3 mv
RV LEAD THRESHOLD: 0.75 V
TZAT-0001FASTVT: 1
TZAT-0004SLOWVT: 8
TZAT-0005FASTVT: 88 pct
TZAT-0012SLOWVT: 200 ms
TZAT-0012SLOWVT: 200 ms
TZAT-0013FASTVT: 1
TZAT-0013SLOWVT: 2
TZAT-0013SLOWVT: 2
TZAT-0018FASTVT: NEGATIVE
TZAT-0018SLOWVT: NEGATIVE
TZAT-0019FASTVT: 8 V
TZAT-0019SLOWVT: 8 V
TZAT-0019SLOWVT: 8 V
TZAT-0020SLOWVT: 1.6 ms
TZAT-0020SLOWVT: 1.6 ms
TZON-0003SLOWVT: 360 ms
TZON-0004SLOWVT: 20
TZON-0005SLOWVT: 12
TZON-0008FASTVT: 0 ms
TZON-0008SLOWVT: 0 ms
TZON-0010SLOWVT: 50 ms
TZST-0001FASTVT: 2
TZST-0001FASTVT: 3
TZST-0001FASTVT: 5
TZST-0001SLOWVT: 5
TZST-0003FASTVT: 35 J
TZST-0003SLOWVT: 25 J
TZST-0003SLOWVT: 35 J

## 2011-11-21 NOTE — Telephone Encounter (Signed)
I CONTACTED PT RE SURGERY SCHEDULING/ ORDERS ARE IN EPIC/ I WILL SPEAK WITH DR. TOTH/GY

## 2011-12-12 ENCOUNTER — Encounter: Payer: Medicare Other | Admitting: *Deleted

## 2011-12-17 ENCOUNTER — Encounter (HOSPITAL_COMMUNITY): Payer: Self-pay | Admitting: Respiratory Therapy

## 2011-12-25 ENCOUNTER — Encounter (HOSPITAL_COMMUNITY): Payer: Self-pay

## 2011-12-25 ENCOUNTER — Encounter (HOSPITAL_COMMUNITY)
Admission: RE | Admit: 2011-12-25 | Discharge: 2011-12-25 | Disposition: A | Discharge status: Home / Self Care | Payer: Medicare Other | Source: Ambulatory Visit | Attending: General Surgery | Admitting: General Surgery

## 2011-12-25 ENCOUNTER — Telehealth (INDEPENDENT_AMBULATORY_CARE_PROVIDER_SITE_OTHER): Payer: Self-pay

## 2011-12-25 HISTORY — DX: Benign prostatic hyperplasia without lower urinary tract symptoms: N40.0

## 2011-12-25 HISTORY — DX: Gastro-esophageal reflux disease without esophagitis: K21.9

## 2011-12-25 HISTORY — DX: Atherosclerotic heart disease of native coronary artery without angina pectoris: I25.10

## 2011-12-25 LAB — URINALYSIS, MICROSCOPIC ONLY
Ketones, ur: NEGATIVE mg/dL
Protein, ur: 100 mg/dL — AB
Urobilinogen, UA: 0.2 mg/dL (ref 0.0–1.0)

## 2011-12-25 LAB — BASIC METABOLIC PANEL
Calcium: 9.7 mg/dL (ref 8.4–10.5)
Creatinine, Ser: 1.32 mg/dL (ref 0.50–1.35)
GFR calc non Af Amer: 50 mL/min — ABNORMAL LOW (ref >90)
Glucose, Bld: 107 mg/dL — ABNORMAL HIGH (ref 70–99)
Sodium: 142 mEq/L (ref 135–145)

## 2011-12-25 LAB — CBC
MCH: 29.9 pg (ref 26.0–34.0)
Platelets: 175 10*3/uL (ref 150–400)
RBC: 5.15 MIL/uL (ref 4.22–5.81)
RDW: 14.2 % (ref 11.5–15.5)
WBC: 6.8 10*3/uL (ref 4.0–10.5)

## 2011-12-25 LAB — SURGICAL PCR SCREEN: MRSA, PCR: NEGATIVE

## 2011-12-25 NOTE — Progress Notes (Signed)
Pt informed nurse of blood noted in urine this am. Nurse called Dr. Billey Chang office and informed of this. Urinalysis ordered and was entered into Epic. Pt informed that a urine specimen was needed. Pt verbalized understanding. Will collect and send to lab.

## 2011-12-25 NOTE — Progress Notes (Signed)
Perioperative prescription for implanted cardiac device programming sheet faxed to Dr. Ludwig Clarks office. Confirmation message arrived. Nurse also informed Leta Jungling with [employer] about IDC at [phone].

## 2011-12-25 NOTE — Pre-Procedure Instructions (Signed)
20 Adrian Pace  12/25/2011   Your procedure is scheduled on: Wednesday January 01, 2012 at 0830 am  Report to [facility] at 0630 AM.  Call this number if you have problems the morning of surgery: [phone]   Remember:   Do not eat food:After Midnight.  May have clear liquids: up to 4 Hours before arrival until 0230 am.  Clear liquids include soda, tea, black coffee, apple or grape juice, broth.  Take these medicines the morning of surgery with A SIP OF WATER: Coreg, Ativan, and Prilosec   Do not wear jewelry, make-up or nail polish.  Do not wear lotions, powders, or perfumes. You may wear deodorant.  Do not shave 48 hours prior to surgery.  Do not bring valuables to the hospital.  Contacts, dentures or bridgework may not be worn into surgery.  Leave suitcase in the car. After surgery it may be brought to your room.  For patients admitted to the hospital, checkout time is 11:00 AM the day of discharge.   Patients discharged the day of surgery will not be allowed to drive home.  Name and phone number of your driver:   Special Instructions: CHG Shower Use Special Wash: 1/2 bottle night before surgery and 1/2 bottle morning of surgery.   Please read over the following fact sheets that you were given: Pain Booklet, Coughing and Deep Breathing and Surgical Site Infection Prevention

## 2011-12-25 NOTE — Telephone Encounter (Signed)
Per Donney Rankins at [facility] pt is in for pre op testing this morning and has c/o blood in urine since yesterday. Reviewed with Dr Carolynne Edouard. Order given to [place] to do ua on pt.

## 2011-12-26 ENCOUNTER — Telehealth (INDEPENDENT_AMBULATORY_CARE_PROVIDER_SITE_OTHER): Payer: Self-pay

## 2011-12-26 NOTE — Telephone Encounter (Signed)
Patient will Dr. Retta Diones office for follow up on abnormal urinalysis.  Results faxed to Dr. Retta Diones. Patient will call back if he is unable to get appointment.

## 2011-12-30 ENCOUNTER — Telehealth (INDEPENDENT_AMBULATORY_CARE_PROVIDER_SITE_OTHER): Payer: Self-pay

## 2011-12-30 NOTE — Telephone Encounter (Signed)
Ok for surgery per Dr. Carolynne Edouard

## 2011-12-31 MED ORDER — CHLORHEXIDINE GLUCONATE 4 % EX LIQD
1.0000 "application " | Freq: Once | CUTANEOUS | Status: DC
  Filled 2011-12-31: qty 15

## 2011-12-31 MED ORDER — CEFAZOLIN SODIUM-DEXTROSE 2-3 GM-% IV SOLR
2.0000 g | INTRAVENOUS | Status: AC
  Administered 2012-01-01: 2 g via INTRAVENOUS
  Filled 2011-12-31: qty 50

## 2012-01-01 ENCOUNTER — Encounter (HOSPITAL_COMMUNITY): Payer: Self-pay | Admitting: *Deleted

## 2012-01-01 ENCOUNTER — Encounter (HOSPITAL_COMMUNITY): Payer: Self-pay | Admitting: Anesthesiology

## 2012-01-01 ENCOUNTER — Ambulatory Visit (HOSPITAL_COMMUNITY): Payer: Medicare Other | Admitting: Anesthesiology

## 2012-01-01 ENCOUNTER — Ambulatory Visit (HOSPITAL_COMMUNITY)
Admission: RE | Admit: 2012-01-01 | Discharge: 2012-01-02 | Disposition: A | Discharge status: Home / Self Care | Payer: Medicare Other | Source: Ambulatory Visit | Attending: General Surgery | Admitting: General Surgery

## 2012-01-01 ENCOUNTER — Encounter (HOSPITAL_COMMUNITY)
Admission: RE | Disposition: A | Discharge status: Home / Self Care | Payer: Self-pay | Source: Ambulatory Visit | Attending: General Surgery

## 2012-01-01 DIAGNOSIS — K219 Gastro-esophageal reflux disease without esophagitis: Secondary | ICD-10-CM | POA: Insufficient documentation

## 2012-01-01 DIAGNOSIS — Z01812 Encounter for preprocedural laboratory examination: Secondary | ICD-10-CM | POA: Insufficient documentation

## 2012-01-01 DIAGNOSIS — K429 Umbilical hernia without obstruction or gangrene: Secondary | ICD-10-CM

## 2012-01-01 DIAGNOSIS — I252 Old myocardial infarction: Secondary | ICD-10-CM | POA: Insufficient documentation

## 2012-01-01 DIAGNOSIS — Z95810 Presence of automatic (implantable) cardiac defibrillator: Secondary | ICD-10-CM | POA: Insufficient documentation

## 2012-01-01 DIAGNOSIS — I10 Essential (primary) hypertension: Secondary | ICD-10-CM | POA: Insufficient documentation

## 2012-01-01 HISTORY — PX: HERNIA REPAIR: SHX51

## 2012-01-01 HISTORY — PX: UMBILICAL HERNIA REPAIR: SHX196

## 2012-01-01 SURGERY — REPAIR, HERNIA, UMBILICAL, ADULT
Anesthesia: General | Site: Abdomen

## 2012-01-01 MED ORDER — HYDROCODONE-ACETAMINOPHEN 5-325 MG PO TABS: 1.0000 | ORAL_TABLET | Freq: Four times a day (QID) | ORAL | Status: DC | PRN

## 2012-01-01 MED ORDER — ALLOPURINOL 100 MG PO TABS
100.0000 mg | ORAL_TABLET | Freq: Every day | ORAL | Status: DC
  Administered 2012-01-01: 100 mg via ORAL
  Filled 2012-01-01 (×2): qty 1

## 2012-01-01 MED ORDER — OXYCODONE HCL 5 MG PO TABS
5.0000 mg | ORAL_TABLET | ORAL | Status: DC | PRN
  Administered 2012-01-01 – 2012-01-02 (×2): 10 mg via ORAL
  Filled 2012-01-01 (×2): qty 2

## 2012-01-01 MED ORDER — ONDANSETRON HCL 4 MG/2ML IJ SOLN: 4.0000 mg | Freq: Four times a day (QID) | INTRAMUSCULAR | Status: DC | PRN

## 2012-01-01 MED ORDER — MIDAZOLAM HCL 5 MG/5ML IJ SOLN
INTRAMUSCULAR | Status: DC | PRN
  Administered 2012-01-01: 2 mg via INTRAVENOUS

## 2012-01-01 MED ORDER — CARVEDILOL 25 MG PO TABS
25.0000 mg | ORAL_TABLET | Freq: Two times a day (BID) | ORAL | Status: DC
  Administered 2012-01-01 – 2012-01-02 (×2): 25 mg via ORAL
  Filled 2012-01-01 (×4): qty 1

## 2012-01-01 MED ORDER — SODIUM CHLORIDE 0.9 % IJ SOLN
3.0000 mL | Freq: Two times a day (BID) | INTRAMUSCULAR | Status: DC
  Administered 2012-01-01: 3 mL via INTRAVENOUS

## 2012-01-01 MED ORDER — PANTOPRAZOLE SODIUM 40 MG IV SOLR
40.0000 mg | Freq: Every day | INTRAVENOUS | Status: DC
  Administered 2012-01-01: 40 mg via INTRAVENOUS
  Filled 2012-01-01 (×2): qty 40

## 2012-01-01 MED ORDER — ONDANSETRON HCL 4 MG/2ML IJ SOLN
INTRAMUSCULAR | Status: AC
  Filled 2012-01-01: qty 2

## 2012-01-01 MED ORDER — SODIUM CHLORIDE 0.9 % IJ SOLN: 3.0000 mL | INTRAMUSCULAR | Status: DC | PRN

## 2012-01-01 MED ORDER — KCL IN DEXTROSE-NACL 20-5-0.9 MEQ/L-%-% IV SOLN
INTRAVENOUS | Status: DC
  Administered 2012-01-01: 15:00:00 via INTRAVENOUS
  Filled 2012-01-01: qty 1000

## 2012-01-01 MED ORDER — SIMVASTATIN 40 MG PO TABS
40.0000 mg | ORAL_TABLET | Freq: Every evening | ORAL | Status: DC
  Administered 2012-01-01: 40 mg via ORAL
  Filled 2012-01-01 (×2): qty 1

## 2012-01-01 MED ORDER — FENTANYL CITRATE 0.05 MG/ML IJ SOLN
INTRAMUSCULAR | Status: DC | PRN
  Administered 2012-01-01: 100 ug via INTRAVENOUS
  Administered 2012-01-01: 50 ug via INTRAVENOUS
  Administered 2012-01-01: 100 ug via INTRAVENOUS

## 2012-01-01 MED ORDER — LORAZEPAM 0.5 MG PO TABS
0.2500 mg | ORAL_TABLET | Freq: Every day | ORAL | Status: DC | PRN
  Administered 2012-01-02: 0.25 mg via ORAL
  Filled 2012-01-01: qty 1

## 2012-01-01 MED ORDER — BUPIVACAINE-EPINEPHRINE 0.25% -1:200000 IJ SOLN
INTRAMUSCULAR | Status: DC | PRN
  Administered 2012-01-01: 10 mL

## 2012-01-01 MED ORDER — ASPIRIN 325 MG PO TABS
325.0000 mg | ORAL_TABLET | Freq: Every day | ORAL | Status: DC
  Filled 2012-01-01 (×2): qty 1

## 2012-01-01 MED ORDER — MEPERIDINE HCL 25 MG/ML IJ SOLN: 6.2500 mg | INTRAMUSCULAR | Status: DC | PRN

## 2012-01-01 MED ORDER — CEFAZOLIN SODIUM 1-5 GM-% IV SOLN
INTRAVENOUS | Status: AC
  Filled 2012-01-01: qty 100

## 2012-01-01 MED ORDER — LACTATED RINGERS IV SOLN
INTRAVENOUS | Status: DC | PRN
  Administered 2012-01-01 (×2): via INTRAVENOUS

## 2012-01-01 MED ORDER — SUCCINYLCHOLINE CHLORIDE 20 MG/ML IJ SOLN
INTRAMUSCULAR | Status: DC | PRN
  Administered 2012-01-01: 100 mg via INTRAVENOUS

## 2012-01-01 MED ORDER — SODIUM CHLORIDE 0.9 % IV SOLN: 250.0000 mL | INTRAVENOUS | Status: DC | PRN

## 2012-01-01 MED ORDER — EPHEDRINE SULFATE 50 MG/ML IJ SOLN
INTRAMUSCULAR | Status: DC | PRN
  Administered 2012-01-01: 10 mg via INTRAVENOUS

## 2012-01-01 MED ORDER — ONDANSETRON HCL 4 MG/2ML IJ SOLN
INTRAMUSCULAR | Status: DC | PRN
  Administered 2012-01-01: 4 mg via INTRAVENOUS

## 2012-01-01 MED ORDER — ONDANSETRON HCL 4 MG/2ML IJ SOLN
4.0000 mg | Freq: Four times a day (QID) | INTRAMUSCULAR | Status: DC | PRN
  Administered 2012-01-02: 4 mg via INTRAVENOUS
  Filled 2012-01-01: qty 2

## 2012-01-01 MED ORDER — ETOMIDATE 2 MG/ML IV SOLN
INTRAVENOUS | Status: DC | PRN
  Administered 2012-01-01: 16 mg via INTRAVENOUS

## 2012-01-01 MED ORDER — PHENYLEPHRINE HCL 10 MG/ML IJ SOLN
INTRAMUSCULAR | Status: DC | PRN
  Administered 2012-01-01: 40 ug via INTRAVENOUS
  Administered 2012-01-01: 80 ug via INTRAVENOUS
  Administered 2012-01-01: 40 ug via INTRAVENOUS
  Administered 2012-01-01: 80 ug via INTRAVENOUS

## 2012-01-01 MED ORDER — ACETAMINOPHEN 325 MG PO TABS: 650.0000 mg | ORAL_TABLET | ORAL | Status: DC | PRN

## 2012-01-01 MED ORDER — 0.9 % SODIUM CHLORIDE (POUR BTL) OPTIME
TOPICAL | Status: DC | PRN
  Administered 2012-01-01: 1000 mL

## 2012-01-01 MED ORDER — HYDROCODONE-ACETAMINOPHEN 5-325 MG PO TABS: 1.0000 | ORAL_TABLET | ORAL | Status: DC | PRN

## 2012-01-01 MED ORDER — ACETAMINOPHEN 650 MG RE SUPP: 650.0000 mg | RECTAL | Status: DC | PRN

## 2012-01-01 MED ORDER — MORPHINE SULFATE 2 MG/ML IJ SOLN: 0.0500 mg/kg | INTRAMUSCULAR | Status: DC | PRN

## 2012-01-01 MED ORDER — MORPHINE SULFATE 2 MG/ML IJ SOLN
2.0000 mg | INTRAMUSCULAR | Status: DC | PRN
  Administered 2012-01-01 – 2012-01-02 (×3): 2 mg via INTRAVENOUS
  Filled 2012-01-01 (×4): qty 1

## 2012-01-01 MED ORDER — OMEPRAZOLE MAGNESIUM 20 MG PO TBEC: 20.0000 mg | DELAYED_RELEASE_TABLET | Freq: Every day | ORAL | Status: DC

## 2012-01-01 MED ORDER — ONDANSETRON HCL 4 MG/2ML IJ SOLN
4.0000 mg | Freq: Once | INTRAMUSCULAR | Status: AC | PRN
  Administered 2012-01-01: 4 mg via INTRAVENOUS

## 2012-01-01 MED ORDER — BENAZEPRIL HCL 10 MG PO TABS
10.0000 mg | ORAL_TABLET | Freq: Every day | ORAL | Status: DC
  Administered 2012-01-01: 10 mg via ORAL
  Filled 2012-01-01 (×2): qty 1

## 2012-01-01 MED ORDER — KCL IN DEXTROSE-NACL 20-5-0.9 MEQ/L-%-% IV SOLN: INTRAVENOUS | Status: DC

## 2012-01-01 MED ORDER — HYDROMORPHONE HCL PF 1 MG/ML IJ SOLN
0.2500 mg | INTRAMUSCULAR | Status: DC | PRN
  Administered 2012-01-01: 0.5 mg via INTRAVENOUS

## 2012-01-01 SURGICAL SUPPLY — 45 items
BLADE SURG 10 STRL SS (BLADE) ×2 IMPLANT
BLADE SURG 15 STRL LF DISP TIS (BLADE) ×1 IMPLANT
BLADE SURG 15 STRL SS (BLADE) ×1
BLADE SURG ROTATE 9660 (MISCELLANEOUS) IMPLANT
CHLORAPREP W/TINT 26ML (MISCELLANEOUS) ×2 IMPLANT
CLOTH BEACON ORANGE TIMEOUT ST (SAFETY) ×2 IMPLANT
COTTONBALL LRG STERILE PKG (GAUZE/BANDAGES/DRESSINGS) IMPLANT
COVER SURGICAL LIGHT HANDLE (MISCELLANEOUS) ×2 IMPLANT
DECANTER SPIKE VIAL GLASS SM (MISCELLANEOUS) ×2 IMPLANT
DERMABOND ADVANCED (GAUZE/BANDAGES/DRESSINGS) ×1
DERMABOND ADVANCED .7 DNX12 (GAUZE/BANDAGES/DRESSINGS) ×1 IMPLANT
DRAPE LAPAROSCOPIC ABDOMINAL (DRAPES) ×2 IMPLANT
DRAPE UTILITY 15X26 W/TAPE STR (DRAPE) ×4 IMPLANT
ELECT CAUTERY BLADE 6.4 (BLADE) ×2 IMPLANT
ELECT REM PT RETURN 9FT ADLT (ELECTROSURGICAL) ×2
ELECTRODE REM PT RTRN 9FT ADLT (ELECTROSURGICAL) ×1 IMPLANT
GAUZE SPONGE 4X4 16PLY XRAY LF (GAUZE/BANDAGES/DRESSINGS) ×2 IMPLANT
GLOVE BIO SURGEON STRL SZ7.5 (GLOVE) ×4 IMPLANT
GLOVE BIOGEL PI IND STRL 7.5 (GLOVE) ×1 IMPLANT
GLOVE BIOGEL PI INDICATOR 7.5 (GLOVE) ×1
GLOVE SS BIOGEL STRL SZ 7 (GLOVE) ×1 IMPLANT
GLOVE SUPERSENSE BIOGEL SZ 7 (GLOVE) ×1
GOWN STRL NON-REIN LRG LVL3 (GOWN DISPOSABLE) ×4 IMPLANT
KIT BASIN OR (CUSTOM PROCEDURE TRAY) ×2 IMPLANT
KIT ROOM TURNOVER OR (KITS) ×2 IMPLANT
NEEDLE HYPO 25GX1X1/2 BEV (NEEDLE) ×2 IMPLANT
NS IRRIG 1000ML POUR BTL (IV SOLUTION) ×2 IMPLANT
PACK SURGICAL SETUP 50X90 (CUSTOM PROCEDURE TRAY) ×2 IMPLANT
PAD ARMBOARD 7.5X6 YLW CONV (MISCELLANEOUS) ×4 IMPLANT
PATCH VENTRAL SMALL 4.3 (Mesh Specialty) ×2 IMPLANT
PENCIL BUTTON HOLSTER BLD 10FT (ELECTRODE) ×2 IMPLANT
SPONGE GAUZE 4X4 12PLY (GAUZE/BANDAGES/DRESSINGS) IMPLANT
SPONGE LAP 18X18 X RAY DECT (DISPOSABLE) ×2 IMPLANT
SUT MNCRL AB 4-0 PS2 18 (SUTURE) ×2 IMPLANT
SUT NOVA NAB DX-16 0-1 5-0 T12 (SUTURE) ×2 IMPLANT
SUT PROLENE 0 CT 1 CR/8 (SUTURE) IMPLANT
SUT VIC AB 2-0 SH 27 (SUTURE) ×1
SUT VIC AB 2-0 SH 27X BRD (SUTURE) ×1 IMPLANT
SUT VIC AB 3-0 SH 27 (SUTURE) ×1
SUT VIC AB 3-0 SH 27XBRD (SUTURE) ×1 IMPLANT
SYR BULB 3OZ (MISCELLANEOUS) ×2 IMPLANT
SYR CONTROL 10ML LL (SYRINGE) ×2 IMPLANT
TOWEL OR 17X24 6PK STRL BLUE (TOWEL DISPOSABLE) ×2 IMPLANT
TOWEL OR 17X26 10 PK STRL BLUE (TOWEL DISPOSABLE) ×2 IMPLANT
WATER STERILE IRR 1000ML POUR (IV SOLUTION) IMPLANT

## 2012-01-01 NOTE — Op Note (Signed)
01/01/2012  9:49 AM  PATIENT:  Adrian Pace  78 y.o. male  PRE-OPERATIVE DIAGNOSIS:  umbilical hernia  POST-OPERATIVE DIAGNOSIS:  umbilical hernia  PROCEDURE:  Procedure(s) (LRB): HERNIA REPAIR UMBILICAL ADULT (N/A)  SURGEON:  Surgeon(s) and Role:    * Adrian Askew, MD - Primary  PHYSICIAN ASSISTANT:   ASSISTANTS: none   ANESTHESIA:   general  EBL:  Total I/O In: 1000 [I.V.:1000] Out: -   BLOOD ADMINISTERED:none  DRAINS: none   LOCAL MEDICATIONS USED:  MARCAINE     SPECIMEN:  No Specimen  DISPOSITION OF SPECIMEN:  N/A  COUNTS:  YES  TOURNIQUET:  * No tourniquets in log *  DICTATION: .Dragon Dictation After informed consent was obtained the patient was brought to the operating room and placed in the supine position on the operating room table. After adequate induction of general anesthesia the patient's abdomen was prepped with ChloraPrep, allowed to dry, and draped in usual sterile manner. The area around the umbilicus was infiltrated with quarter percent Marcaine with epinephrine. A curvilinear incision was made with a 15 blade knife at the inferior age of the umbilicus. This incision was carried down through the skin and subcutaneous tissue sharply with the electrocautery. The hernia was identified. There was only preperitoneal fat within the hernia. Once the edges of the hernia were mobilized we were Pace to easily reduce the fat. The fascial edges were healthy. We probed the preperitoneal space with blunt finger dissection. Some of the fatty adhesions to the underside of the fascia were divided with the electrocautery. This created a good preperitoneal space for a small piece of mesh. A small umbilical hernia repair system was chosen. The mesh was placed through the fascia into the preperitoneal space. The mesh opened up nicely. There was good coverage of the hernia defect. The fascial defect was then closed with interrupted #1 Novafil stitches which incorporated the  mesh tails to anchor the mesh in place. In between the Novafil stitches we also reapproximated the fascia with 2 interrupted 0 Vicryl stitches. The subcutaneous tissue was then closed with interrupted 2-0 Vicryl stitches. The wound was irrigated with copious amounts saline. The skin was then closed with interrupted 4 Monocryl subcuticular stitches. Dermabond dressings were applied. The patient tolerated the procedure well. At the end of the case all needle sponge and instrument counts were correct. The patient was then awakened and taken to recovery in stable condition.  PLAN OF CARE: Discharge to home after PACU  PATIENT DISPOSITION:  PACU - hemodynamically stable.   Delay start of Pharmacological VTE agent (>24hrs) due to surgical blood loss or risk of bleeding: not applicable

## 2012-01-01 NOTE — Anesthesia Preprocedure Evaluation (Signed)
Anesthesia Evaluation  Patient identified by MRN, date of birth, ID band  Airway Mallampati: II TM Distance: >3 FB Neck ROM: Full    Dental   Pulmonary          Cardiovascular hypertension, Pt. on medications + Past MI     Neuro/Psych    GI/Hepatic GERD-  Controlled,  Endo/Other    Renal/GU      Musculoskeletal   Abdominal   Peds  Hematology   Anesthesia Other Findings   Reproductive/Obstetrics                           Anesthesia Physical Anesthesia Plan  ASA: III  Anesthesia Plan: General   Post-op Pain Management:    Induction: Intravenous  Airway Management Planned: Oral ETT  Additional Equipment: Arterial line and PA Cath  Intra-op Plan:   Post-operative Plan: Post-operative intubation/ventilation  Informed Consent: I have reviewed the patients History and Physical, chart, labs and discussed the procedure including the risks, benefits and alternatives for the proposed anesthesia with the patient or authorized representative who has indicated his/her understanding and acceptance.     Plan Discussed with: CRNA and Surgeon  Anesthesia Plan Comments:         Anesthesia Quick Evaluation

## 2012-01-01 NOTE — Preoperative (Signed)
Beta Blockers   Reason not to administer Beta Blockers:Not Applicable 

## 2012-01-01 NOTE — Progress Notes (Signed)
UR of chart complete.  

## 2012-01-01 NOTE — Interval H&P Note (Signed)
History and Physical Interval Note:  01/01/2012 7:46 AM  Adrian Pace  has presented today for surgery, with the diagnosis of umbilical hernia  The various methods of treatment have been discussed with the patient and family. After consideration of risks, benefits and other options for treatment, the patient has consented to  Procedure(s) (LRB): HERNIA REPAIR UMBILICAL ADULT (N/A) as a surgical intervention .  The patients' history has been reviewed, patient examined, no change in status, stable for surgery.  I have reviewed the patients' chart and labs.  Questions were answered to the patient's satisfaction.     TOTH III,Azelea Seguin S

## 2012-01-01 NOTE — H&P (Signed)
Adrian Pace   11/05/2011 9:10 AM Office Visit  MRN: [id number]   Description: 78 year old male  Provider: Robyne Askew, MD  Department: [facility]        Diagnoses     Umbilical hernia   - Primary    553.1      Reason for Visit     Pre-op Exam    eval hernia        Vitals - Last Recorded       BP Pulse Temp(Src) Ht Wt BMI    142/82  79  97.4 F (36.3 C) (Temporal)  5\' 8"  (1.727 m)  180 lb 12.8 oz (82.01 kg)  27.49 kg/m2          SpO2              95%                 Progress Notes     Robyne Askew, MD  11/12/2011  9:16 AM  SignedSubjective:      Patient ID: Adrian Pace, male   DOB: Mar 24, 1933, 78 y.o.   MRN: [id number]   HPI The patient is a 73 her old white male who is actually the husband of one of our breast cancer patients. A last several months he has been noticing some bulging of his bellybutton. He has experienced some intermittent discomfort associated with it. He denies any nausea or vomiting or obstructive symptoms. He denies any fevers or chills he does have a history of coronary artery disease and also has a defibrillator. He is followed by Dr. Jens Som.   Review of Systems  Constitutional: Negative.   HENT: Negative.   Eyes: Negative.   Respiratory: Negative.   Cardiovascular: Negative.   Gastrointestinal: Positive for abdominal pain.  Genitourinary: Negative.   Musculoskeletal: Negative.   Skin: Negative.   Neurological: Negative.   Hematological: Negative.   Psychiatric/Behavioral: Negative.       Objective:    Physical Exam  Constitutional: He is oriented to person, place, and time. He appears well-developed and well-nourished.  HENT:   Head: Normocephalic and atraumatic.  Eyes: Conjunctivae and EOM are normal. Pupils are equal, round, and reactive to light.  Neck: Normal range of motion. Neck supple.  Cardiovascular: Normal rate, regular rhythm and normal heart sounds.        Defibrillator in place    Pulmonary/Chest: Effort normal and breath sounds normal.  Abdominal: Soft. Bowel sounds are normal.       Moderate sized umbilical hernia that reduces partially. No abdominal distention or abdominal pain.  Musculoskeletal: Normal range of motion.  Neurological: He is alert and oriented to person, place, and time.  Skin: Skin is warm and dry.  Psychiatric: He has a normal mood and affect. His behavior is normal.      Assessment:    Symptomatic umbilical hernia   Plan:    Because of the risk of incarceration strangulation I think he would benefit from having the hernia fixed. He would also like to have this done. I have discussed with him in detail the risks and benefits of the operation fix the hernia as well as some of the technical aspects including the use of mesh and he understands and wishes to proceed. We will obtain cardiac clearance from Dr. Jens Som to make sure it safe to put him to sleep prior to scheduling surgery.  Not recorded             Patient Instructions     Plan for cardiac clearance with Dr. Jens Som       Level of Service     PR OFFICE OUTPATIENT VISIT 15 MINUTES [99213]      Follow-up and Disposition     Return if symptoms worsen or fail to improve, for call when ready to schedule.       Follow-up and Disposition History Recorded        All Flowsheet Templates (all recorded)     Encounter Vitals Flowsheet    Custom Formula Data Flowsheet    Anthropometrics Flowsheet               Referring Provider          Astrid Divine, MD       All Charges for This Encounter       Code Description Service Date Service Provider Modifiers Quantity    [phone] PR OFFICE OUTPATIENT VISIT 15 MINUTES 11/05/2011 Caleen Essex III, MD   1    [phone] PR PT VIS [facility] USE EHR CER ATCB 11/05/2011 Robyne Askew, MD   1    [phone] PR CURRENT TOBACCO NON-USER 11/05/2011 Robyne Askew, MD   1        Other Encounter Related Information      Allergies & Medications         Problem List         History         Patient-Entered Questionnaires     No data filed

## 2012-01-01 NOTE — Anesthesia Postprocedure Evaluation (Signed)
Anesthesia Post Note  Patient: Adrian Pace  Procedure(s) Performed: Procedure(s) (LRB): HERNIA REPAIR UMBILICAL ADULT (N/A)  Anesthesia type: general  Patient location: PACU  Post pain: Pain level controlled  Post assessment: Patient's Cardiovascular Status Stable  Last Vitals:  Filed Vitals:   01/01/12 1015  BP: 138/74  Pulse: 82  Temp:   Resp: 19    Post vital signs: Reviewed and stable  Level of consciousness: sedated  Complications: No apparent anesthesia complications

## 2012-01-01 NOTE — Transfer of Care (Signed)
Immediate Anesthesia Transfer of Care Note  Patient: Adrian Pace  Procedure(s) Performed: Procedure(s) (LRB): HERNIA REPAIR UMBILICAL ADULT (N/A)  Patient Location: PACU  Anesthesia Type: General  Level of Consciousness: awake, alert , oriented and sedated  Airway & Oxygen Therapy: Patient Spontanous Breathing and Patient connected to face mask oxygen  Post-op Assessment: Report given to PACU RN, Post -op Vital signs reviewed and stable and Patient moving all extremities  Post vital signs: Reviewed and stable  Complications: No apparent anesthesia complications

## 2012-01-02 NOTE — Progress Notes (Signed)
1 Day Post-Op  Subjective: Has anxiety but feeling better  Objective: Vital signs in last 24 hours: Temp:  [96.5 F (35.8 C)-99.2 F (37.3 C)] 98.7 F (37.1 C) (03/07 0530) Pulse Rate:  [65-101] 101  (03/07 0530) Resp:  [16-34] 16  (03/07 0530) BP: (138-157)/(61-98) 149/98 mmHg (03/07 0530) SpO2:  [95 %-99 %] 96 % (03/07 0530) Weight:  [179 lb 7.3 oz (81.4 kg)] 179 lb 7.3 oz (81.4 kg) (03/07 0700) Last BM Date: 01/01/12  Intake/Output from previous day: 03/06 0701 - 03/07 0700 In: 1401.3 [I.V.:1401.3] Out: 1800 [Urine:1650; Emesis/NG output:150] Intake/Output this shift: Total I/O In: -  Out: 100 [Urine:100]  GI: soft, nontender. incision ok  Lab Results:  No results found for this basename: WBC:2,HGB:2,HCT:2,PLT:2 in the last 72 hours BMET No results found for this basename: NA:2,K:2,CL:2,CO2:2,GLUCOSE:2,BUN:2,CREATININE:2,CALCIUM:2 in the last 72 hours PT/INR No results found for this basename: LABPROT:2,INR:2 in the last 72 hours ABG No results found for this basename: PHART:2,PCO2:2,PO2:2,HCO3:2 in the last 72 hours  Studies/Results: No results found.  Anti-infectives: Anti-infectives     Start     Dose/Rate Route Frequency Ordered Stop   12/31/11 1515   ceFAZolin (ANCEF) IVPB 2 g/50 mL premix        2 g 100 mL/hr over 30 Minutes Intravenous 60 min pre-op 12/31/11 1507 01/01/12 0842          Assessment/Plan: s/p Procedure(s) (LRB): HERNIA REPAIR UMBILICAL ADULT (N/A) Discharge  LOS: 1 day    TOTH III,Ardell Aaronson S 01/02/2012

## 2012-01-02 NOTE — Progress Notes (Signed)
Patient discharged to home in care of spouse. Medications and instructions reviewed with patient and spouse with no questions. Assessment unchanged from this am. IV d/c'd with cath intact. Patient is to follow up with Dr. Carolynne Edouard in 2 weeks.

## 2012-01-02 NOTE — Discharge Summary (Signed)
Physician Discharge Summary  Patient ID: Adrian Pace MRN: [id number] DOB/AGE: 76/03/1933 76 y.o.  Admit date: 01/01/2012 Discharge date: 01/02/2012  Admission Diagnoses:  Discharge Diagnoses:  Active Problems:  * No active hospital problems. *    Discharged Condition: good  Hospital Course: the patient underwent umbilical hernia repair with mesh. He stayed overnight for observation because of his pacer. He did well and is ready for d/c home  Consults: None  Significant Diagnostic Studies: none  Treatments: surgery: umbilical hernia repair with mesh  Discharge Exam: Blood pressure 149/98, pulse 101, temperature 98.7 F (37.1 C), temperature source Oral, resp. rate 16, height 5\' 6"  (1.676 m), weight 179 lb 7.3 oz (81.4 kg), SpO2 96.00%. GI: soft, nontender, incision ok  Disposition: 01-Home or Self Care  Discharge Orders    Future Appointments: Provider: Department: Dept Phone: Center:   01/07/2012 9:00 AM Lewayne Bunting, MD [facility] [phone] [facility]   02/18/2012 9:00 AM Lewayne Bunting, MD [facility] [phone] [facility]     Medication List  As of 01/02/2012  9:09 AM   TAKE these medications         allopurinol 100 MG tablet   Commonly known as: ZYLOPRIM   Take 100 mg by mouth daily.      aspirin 325 MG tablet   Take 325 mg by mouth daily.      benazepril 10 MG tablet   Commonly known as: LOTENSIN   Take 10 mg by mouth daily. Take with a 20mg  tablet for a total of 30mg  daily.      benazepril 20 MG tablet   Commonly known as: LOTENSIN   Take 20 mg by mouth daily. Take with 10mg  tablet for total of 30mg  daily      carvedilol 25 MG tablet   Commonly known as: COREG   Take 25 mg by mouth 2 (two) times daily with a meal.      HYDROcodone-acetaminophen 5-325 MG per tablet   Commonly known as: NORCO   Take 1 tablet by mouth every 6 (six) hours as needed for pain.      LORazepam 0.5 MG tablet   Commonly known as: ATIVAN   Take 0.25  mg by mouth daily as needed. For anxiety      omeprazole 20 MG tablet   Commonly known as: PRILOSEC OTC   Take 20 mg by mouth daily.      simvastatin 40 MG tablet   Commonly known as: ZOCOR   Take 40 mg by mouth every evening.             Signed: Griselda Miner S 01/02/2012, 9:09 AM

## 2012-01-03 ENCOUNTER — Encounter (HOSPITAL_COMMUNITY): Payer: Self-pay | Admitting: General Surgery

## 2012-01-07 ENCOUNTER — Encounter: Payer: Self-pay | Admitting: Cardiology

## 2012-01-07 ENCOUNTER — Ambulatory Visit (INDEPENDENT_AMBULATORY_CARE_PROVIDER_SITE_OTHER): Payer: Medicare Other | Admitting: Cardiology

## 2012-01-07 DIAGNOSIS — I2510 Atherosclerotic heart disease of native coronary artery without angina pectoris: Secondary | ICD-10-CM

## 2012-01-07 DIAGNOSIS — T82110A Breakdown (mechanical) of cardiac electrode, initial encounter: Secondary | ICD-10-CM

## 2012-01-07 DIAGNOSIS — I2589 Other forms of chronic ischemic heart disease: Secondary | ICD-10-CM

## 2012-01-07 DIAGNOSIS — I255 Ischemic cardiomyopathy: Secondary | ICD-10-CM

## 2012-01-07 DIAGNOSIS — I10 Essential (primary) hypertension: Secondary | ICD-10-CM

## 2012-01-07 DIAGNOSIS — E785 Hyperlipidemia, unspecified: Secondary | ICD-10-CM

## 2012-01-07 NOTE — Assessment & Plan Note (Signed)
Management per electrophysiology. 

## 2012-01-07 NOTE — Assessment & Plan Note (Signed)
Continue ACE inhibitor and beta blocker. 

## 2012-01-07 NOTE — Assessment & Plan Note (Signed)
Continue aspirin and statin. 

## 2012-01-07 NOTE — Assessment & Plan Note (Signed)
Continue statin. 

## 2012-01-07 NOTE — Patient Instructions (Addendum)
Your physician wants you to follow-up in: 6 months  You will receive a reminder letter in the mail two months in advance. If you don't receive a letter, please call our office to schedule the follow-up appointment.  Your physician recommends that you continue on your current medications as directed. Please refer to the Current Medication list given to you today.  

## 2012-01-07 NOTE — Progress Notes (Signed)
HPI: Pleasant male for followup of coronary disease. The patient had his last cardiac catheterization in 2003. He had PCI of his LAD and right coronary artery. His ejection fraction was 30%. Myoview in Sept of 2012 showed EF 22, anteroapical infarct; no ischemia. Patient had ICD system revision in Jan 2013. Since I last saw him in Oct 2012, he denies dyspnea on exertion, orthopnea, PND, pedal edema, palpitations, syncope or chest pain.   Current Outpatient Prescriptions  Medication Sig Dispense Refill  . allopurinol (ZYLOPRIM) 100 MG tablet Take 200 mg by mouth daily.       Marland Kitchen aspirin 325 MG tablet Take 325 mg by mouth daily.        . benazepril (LOTENSIN) 10 MG tablet Take 30 mg by mouth daily.       . carvedilol (COREG) 25 MG tablet Take 25 mg by mouth 2 (two) times daily with a meal.      . dutasteride (AVODART) 0.5 MG capsule Take 0.5 mg by mouth daily.      Marland Kitchen LORazepam (ATIVAN) 0.5 MG tablet Take 0.25 mg by mouth daily as needed. For anxiety      . omeprazole (PRILOSEC OTC) 20 MG tablet Take 20 mg by mouth daily.      . simvastatin (ZOCOR) 40 MG tablet Take 40 mg by mouth every evening.      Marland Kitchen DISCONTD: benazepril (LOTENSIN) 20 MG tablet Take 20 mg by mouth daily. Take with 10mg  tablet for total of 30mg  daily         Past Medical History  Diagnosis Date  . Ischemic cardiomyopathy   . Renal insufficiency   . Hyperlipemia   . HTN (hypertension)   . Myocardial infarct 1988  . LV dysfunction   . Hyperkalemia   . Ventricular tachycardia   . CHF (congestive heart failure)   . Syncope and collapse   . Heart attack   . Enlarged prostate   . Coronary artery disease     Hx of cardiac caths   . GERD (gastroesophageal reflux disease)     Takes prilosec  . Anxiety     Takes Ativan as needed  . Panic attacks     Past Surgical History  Procedure Date  . Cardiac catheterization   . Coronary angioplasty   . Cardiac defibrillator placement   . Colonoscopy   .  Esophagogastroduodenoscopy     with biopsy  . Insert / replace / remove pacemaker 2005    ICD; replaced in Jan 2013  . Hernia repair 01/01/2012  . Umbilical hernia repair   . Umbilical hernia repair 01/01/2012    Procedure: HERNIA REPAIR UMBILICAL ADULT;  Surgeon: Robyne Askew, MD;  Location: [facility] OR;  Service: General;  Laterality: N/A;  umbilical hernia repair with mesh    History   Social History  . Marital Status: Married    Spouse Name: N/A    Number of Children: N/A  . Years of Education: N/A   Occupational History  . Not on file.   Social History Main Topics  . Smoking status: Never Smoker   . Smokeless tobacco: Never Used  . Alcohol Use: Yes     occassionally  . Drug Use: No  . Sexually Active: Not Currently   Other Topics Concern  . Not on file   Social History Narrative  . No narrative on file    ROS: Some anxiety and problems with mild abdominal pain from recent surgery but no fevers  or chills, productive cough, hemoptysis, dysphasia, odynophagia, melena, hematochezia, dysuria, hematuria, rash, seizure activity, orthopnea, PND, pedal edema, claudication. Remaining systems are negative.  Physical Exam: Well-developed well-nourished in no acute distress.  Skin is warm and dry.  HEENT is normal.  Neck is supple. No thyromegaly.  Chest is clear to auscultation with normal expansion. D. left chest Cardiovascular exam is regular rate and rhythm.  Abdominal exam nontender or distended. No masses palpated. Extremities show no edema. neuro grossly intact

## 2012-01-07 NOTE — Assessment & Plan Note (Signed)
Blood pressure controlled. Continue present medications. 

## 2012-01-20 ENCOUNTER — Encounter (INDEPENDENT_AMBULATORY_CARE_PROVIDER_SITE_OTHER): Payer: Self-pay | Admitting: General Surgery

## 2012-01-21 ENCOUNTER — Encounter (INDEPENDENT_AMBULATORY_CARE_PROVIDER_SITE_OTHER): Payer: Self-pay | Admitting: General Surgery

## 2012-01-21 ENCOUNTER — Ambulatory Visit (INDEPENDENT_AMBULATORY_CARE_PROVIDER_SITE_OTHER): Payer: Medicare Other | Admitting: General Surgery

## 2012-01-21 VITALS — BP 116/68 | HR 72 | Temp 97.6°F | Resp 16 | Ht 67.0 in | Wt 176.8 lb

## 2012-01-21 DIAGNOSIS — K429 Umbilical hernia without obstruction or gangrene: Secondary | ICD-10-CM

## 2012-01-21 NOTE — Patient Instructions (Signed)
No heavy lifting 

## 2012-01-22 ENCOUNTER — Encounter: Payer: Self-pay | Admitting: Internal Medicine

## 2012-01-26 ENCOUNTER — Encounter (INDEPENDENT_AMBULATORY_CARE_PROVIDER_SITE_OTHER): Payer: Self-pay | Admitting: General Surgery

## 2012-01-26 NOTE — Progress Notes (Signed)
Subjective:     Patient ID: Adrian Pace, male   DOB: 04/05/1933, 78 y.o.   MRN: [id number]  HPI The patient is a 78 year old white male who is about 2 weeks status post umbilical hernia repair with mesh. He complains of soreness but otherwise seems to be doing reasonably well. His appetite is good and his bowels are working normally.  Review of Systems     Objective:   Physical Exam On exam his abdomen is soft and nontender. His incision is healing nicely  With no sign of infection. There is no palpable evidence for recurrence of his hernia.    Assessment:     2 weeks status post umbilical hernia repair with mesh    Plan:     At this point I would like him to continue to refrain from any heavy lifting. We will plan to see him back in one month at which time if he is doing well we'll clear him for full activity.

## 2012-02-18 ENCOUNTER — Ambulatory Visit (INDEPENDENT_AMBULATORY_CARE_PROVIDER_SITE_OTHER): Payer: Medicare Other | Admitting: Internal Medicine

## 2012-02-18 ENCOUNTER — Encounter: Payer: Self-pay | Admitting: Internal Medicine

## 2012-02-18 VITALS — BP 132/58 | HR 62 | Resp 18 | Ht 67.0 in | Wt 178.1 lb

## 2012-02-18 DIAGNOSIS — I509 Heart failure, unspecified: Secondary | ICD-10-CM

## 2012-02-18 DIAGNOSIS — I472 Ventricular tachycardia: Secondary | ICD-10-CM

## 2012-02-18 DIAGNOSIS — I2589 Other forms of chronic ischemic heart disease: Secondary | ICD-10-CM

## 2012-02-18 DIAGNOSIS — T82110A Breakdown (mechanical) of cardiac electrode, initial encounter: Secondary | ICD-10-CM

## 2012-02-18 DIAGNOSIS — I255 Ischemic cardiomyopathy: Secondary | ICD-10-CM

## 2012-02-18 LAB — ICD DEVICE OBSERVATION
FVT: 0
RV LEAD THRESHOLD: 0.5 V
TOT-0001: 1
TZAT-0001SLOWVT: 1
TZAT-0002SLOWVT: NEGATIVE
TZAT-0012FASTVT: 200 ms
TZAT-0012SLOWVT: 200 ms
TZAT-0018SLOWVT: NEGATIVE
TZAT-0019SLOWVT: 8 V
TZAT-0020FASTVT: 1.5 ms
TZAT-0020SLOWVT: 1.5 ms
TZON-0003VSLOWVT: 340 ms
TZON-0004VSLOWVT: 32
TZST-0001FASTVT: 4
TZST-0001FASTVT: 5
TZST-0001SLOWVT: 6
TZST-0002FASTVT: NEGATIVE
TZST-0002FASTVT: NEGATIVE
TZST-0002FASTVT: NEGATIVE
TZST-0002SLOWVT: NEGATIVE
TZST-0002SLOWVT: NEGATIVE
VENTRICULAR PACING ICD: 0.21 pct
VF: 0

## 2012-02-18 NOTE — Assessment & Plan Note (Signed)
He has had no recurrent ventricular arrhythmias. He will continue his current medical therapy. 

## 2012-02-18 NOTE — Assessment & Plan Note (Signed)
He denies anginal symptoms. He will continue his current medical therapy and continue his current level of physical activity.

## 2012-02-18 NOTE — Progress Notes (Signed)
HPI Adrian Pace returns today for followup. He is a very pleasant 78 year old man with an ischemic cardiomyopathy, chronic systolic heart failure, ventricular fibrillation, status post ICD implantation secondary to secondary prevention. The patient developed a fractured ICD lead several months ago and underwent lead revision by my partner Dr. Johney Frame.  Since then, he has done well. He denies chest pain, shortness of breath, or peripheral edema. No ICD shocks. Allergies  Allergen Reactions  . Codeine Other (See Comments)    Causes anxiety     Current Outpatient Prescriptions  Medication Sig Dispense Refill  . allopurinol (ZYLOPRIM) 100 MG tablet Take 200 mg by mouth daily.       Marland Kitchen aspirin 325 MG tablet Take 325 mg by mouth daily.        . benazepril (LOTENSIN) 10 MG tablet Take 30 mg by mouth daily.       . carvedilol (COREG) 25 MG tablet Take 25 mg by mouth 2 (two) times daily with a meal.      . finasteride (PROSCAR) 5 MG tablet Take 5 mg by mouth daily.      Marland Kitchen LORazepam (ATIVAN) 0.5 MG tablet Take 0.25 mg by mouth daily as needed. For anxiety      . omeprazole (PRILOSEC OTC) 20 MG tablet Take 20 mg by mouth daily.      . simvastatin (ZOCOR) 40 MG tablet Take 40 mg by mouth every evening.         Past Medical History  Diagnosis Date  . Ischemic cardiomyopathy   . Renal insufficiency   . Hyperlipemia   . HTN (hypertension)   . Myocardial infarct 1988  . LV dysfunction   . Hyperkalemia   . Ventricular tachycardia   . CHF (congestive heart failure)   . Syncope and collapse   . Heart attack   . Enlarged prostate   . Coronary artery disease     Hx of cardiac caths   . GERD (gastroesophageal reflux disease)     Takes prilosec  . Anxiety     Takes Ativan as needed  . Panic attacks   . Gout   . Personal history of peptic ulcer disease   . Cardiac dysrhythmia, unspecified     ROS:   All systems reviewed and negative except as noted in the HPI.   Past Surgical History    Procedure Date  . Cardiac catheterization 2003  . Coronary angioplasty   . Cardiac defibrillator placement   . Colonoscopy   . Esophagogastroduodenoscopy     with biopsy  . Insert / replace / remove pacemaker 2005    ICD; replaced in Jan 2013  . Hernia repair 01/01/2012  . Umbilical hernia repair   . Umbilical hernia repair 01/01/2012    Procedure: HERNIA REPAIR UMBILICAL ADULT;  Surgeon: Robyne Askew, MD;  Location: [facility] OR;  Service: General;  Laterality: N/A;  umbilical hernia repair with mesh     Family History  Problem Relation Age of Onset  . Heart attack Father     x2  . Heart disease Father   . Cancer Father     bladder and prostate  . Anesthesia problems Neg Hx   . Hypotension Neg Hx   . Malignant hyperthermia Neg Hx   . Pseudochol deficiency Neg Hx      History   Social History  . Marital Status: Married    Spouse Name: N/A    Number of Children: N/A  . Years of Education:  N/A   Occupational History  . Not on file.   Social History Main Topics  . Smoking status: Never Smoker   . Smokeless tobacco: Never Used  . Alcohol Use: Yes     occassionally  . Drug Use: No  . Sexually Active: Not Currently   Other Topics Concern  . Not on file   Social History Narrative  . No narrative on file     BP 132/58  Pulse 62  Resp 18  Ht 5\' 7"  (1.702 m)  Wt 178 lb 1.9 oz (80.795 kg)  BMI 27.90 kg/m2  Physical Exam:  Well appearing 78 year old man, NAD HEENT: Unremarkable Neck:  No JVD, no thyromegally Lymphatics:  No adenopathy Back:  No CVA tenderness Lungs:  Clear with no wheezes, rales, or rhonchi. Well-healed ICD incision. HEART:  Regular rate rhythm, no murmurs, no rubs, no clicks Abd:  soft, positive bowel sounds, no organomegally, no rebound, no guarding Ext:  2 plus pulses, no edema, no cyanosis, no clubbing Skin:  No rashes no nodules Neuro:  CN II through XII intact, motor grossly intact   DEVICE  Normal device function.  See PaceArt for  details.   Assess/Plan:

## 2012-02-18 NOTE — Assessment & Plan Note (Signed)
His device is working normally. We'll plan to recheck in several months. 

## 2012-02-18 NOTE — Patient Instructions (Signed)
Your physician wants you to follow-up in: June 2013 You will receive a reminder letter in the mail two months in advance. If you don't receive a letter, please call our office to schedule the follow-up appointment.  Remote monitoring is used to monitor your Pacemaker of ICD from home. This monitoring reduces the number of office visits required to check your device to one time per year. It allows [place] to keep an eye on the functioning of your device to ensure it is working properly. You are scheduled for a device check from home on 05/21/12. You may send your transmission at any time that day. If you have a wireless device, the transmission will be sent automatically. After your physician reviews your transmission, you will receive a postcard with your next transmission date.

## 2012-03-03 ENCOUNTER — Ambulatory Visit (INDEPENDENT_AMBULATORY_CARE_PROVIDER_SITE_OTHER): Payer: Medicare Other | Admitting: General Surgery

## 2012-03-03 ENCOUNTER — Encounter (INDEPENDENT_AMBULATORY_CARE_PROVIDER_SITE_OTHER): Payer: Self-pay | Admitting: General Surgery

## 2012-03-03 VITALS — BP 130/82 | HR 66 | Temp 96.4°F | Resp 14 | Ht 67.0 in | Wt 180.2 lb

## 2012-03-03 DIAGNOSIS — K429 Umbilical hernia without obstruction or gangrene: Secondary | ICD-10-CM

## 2012-03-03 NOTE — Patient Instructions (Signed)
May return to all normal acitivities 

## 2012-03-03 NOTE — Progress Notes (Signed)
Subjective:     Patient ID: Adrian Pace, male   DOB: [birth year], 78 y.o.   MRN: [id number]  HPI The patient is a 78 year old white male who is about 2 months out from an umbilical hernia repair with mesh. He is doing well. He has no complaints today. His appetite is good and his bowels are working normally. He denies any abdominal pain.  Review of Systems     Objective:   Physical Exam On exam his abdomen is soft and nontender. His incision is healed nicely. He has no palpable evidence for recurrence of the hernia.    Assessment:     2 months status post umbilical hernia repair with mesh    Plan:     At this point I believe she can return to all his normal activities without any restrictions. He will plan to see [place] back on a p.r.n. basis

## 2012-04-22 ENCOUNTER — Other Ambulatory Visit: Payer: Self-pay | Admitting: Cardiology

## 2012-04-22 MED ORDER — BENAZEPRIL HCL 20 MG PO TABS: 20.0000 mg | ORAL_TABLET | Freq: Every day | ORAL | Status: DC

## 2012-04-24 ENCOUNTER — Other Ambulatory Visit: Payer: Self-pay | Admitting: Cardiology

## 2012-05-10 ENCOUNTER — Other Ambulatory Visit: Payer: Self-pay | Admitting: Cardiology

## 2012-05-11 ENCOUNTER — Other Ambulatory Visit: Payer: Self-pay | Admitting: *Deleted

## 2012-05-12 ENCOUNTER — Other Ambulatory Visit: Payer: Self-pay | Admitting: Cardiology

## 2012-05-12 MED ORDER — CARVEDILOL 25 MG PO TABS: 25.0000 mg | ORAL_TABLET | Freq: Two times a day (BID) | ORAL | Status: DC

## 2012-05-21 ENCOUNTER — Ambulatory Visit (INDEPENDENT_AMBULATORY_CARE_PROVIDER_SITE_OTHER): Payer: Medicare Other | Admitting: *Deleted

## 2012-05-21 ENCOUNTER — Encounter: Payer: Self-pay | Admitting: Internal Medicine

## 2012-05-21 DIAGNOSIS — T82110A Breakdown (mechanical) of cardiac electrode, initial encounter: Secondary | ICD-10-CM

## 2012-05-21 DIAGNOSIS — I2589 Other forms of chronic ischemic heart disease: Secondary | ICD-10-CM

## 2012-05-21 DIAGNOSIS — I509 Heart failure, unspecified: Secondary | ICD-10-CM

## 2012-05-21 DIAGNOSIS — I255 Ischemic cardiomyopathy: Secondary | ICD-10-CM

## 2012-05-25 LAB — REMOTE ICD DEVICE
BATTERY VOLTAGE: 3.1973 V
BRDY-0002RV: 40 {beats}/min
CHARGE TIME: 8.558 s
HV IMPEDENCE: 56 Ohm
RV LEAD AMPLITUDE: 8.875 mv
TOT-0002: 0
TOT-0006: 20130111000000
TZAT-0001SLOWVT: 2
TZAT-0002FASTVT: NEGATIVE
TZAT-0002SLOWVT: NEGATIVE
TZAT-0012SLOWVT: 200 ms
TZAT-0018FASTVT: NEGATIVE
TZAT-0018SLOWVT: NEGATIVE
TZAT-0018SLOWVT: NEGATIVE
TZAT-0019FASTVT: 8 V
TZAT-0019SLOWVT: 8 V
TZAT-0020SLOWVT: 1.5 ms
TZON-0003FASTVT: 270 ms
TZON-0003SLOWVT: 400 ms
TZON-0004SLOWVT: 16
TZON-0005SLOWVT: 12
TZST-0001FASTVT: 2
TZST-0001FASTVT: 3
TZST-0001FASTVT: 6
TZST-0001SLOWVT: 3
TZST-0001SLOWVT: 4
TZST-0001SLOWVT: 5
TZST-0002FASTVT: NEGATIVE
TZST-0002FASTVT: NEGATIVE
TZST-0002FASTVT: NEGATIVE
TZST-0002SLOWVT: NEGATIVE
TZST-0002SLOWVT: NEGATIVE
VENTRICULAR PACING ICD: 0.4 pct

## 2012-06-09 ENCOUNTER — Encounter: Payer: Self-pay | Admitting: *Deleted

## 2012-06-30 ENCOUNTER — Encounter: Payer: Self-pay | Admitting: Cardiology

## 2012-07-07 ENCOUNTER — Encounter: Payer: Self-pay | Admitting: Cardiology

## 2012-07-07 ENCOUNTER — Ambulatory Visit (INDEPENDENT_AMBULATORY_CARE_PROVIDER_SITE_OTHER): Payer: Medicare Other | Admitting: Cardiology

## 2012-07-07 VITALS — BP 118/68 | HR 61 | Wt 182.0 lb

## 2012-07-07 DIAGNOSIS — I2510 Atherosclerotic heart disease of native coronary artery without angina pectoris: Secondary | ICD-10-CM

## 2012-07-07 DIAGNOSIS — E785 Hyperlipidemia, unspecified: Secondary | ICD-10-CM

## 2012-07-07 DIAGNOSIS — I10 Essential (primary) hypertension: Secondary | ICD-10-CM

## 2012-07-07 DIAGNOSIS — T82110A Breakdown (mechanical) of cardiac electrode, initial encounter: Secondary | ICD-10-CM

## 2012-07-07 DIAGNOSIS — I2589 Other forms of chronic ischemic heart disease: Secondary | ICD-10-CM

## 2012-07-07 DIAGNOSIS — I255 Ischemic cardiomyopathy: Secondary | ICD-10-CM

## 2012-07-07 NOTE — Progress Notes (Signed)
HPI: Pleasant male for followup of coronary disease. The patient had his last cardiac catheterization in 2003. He had PCI of his LAD and right coronary artery. His ejection fraction was 30%. Myoview in Sept of 2012 showed EF 22, anteroapical infarct; no ischemia. Patient had ICD system revision in Jan 2013. Since I last saw him in March of 2013, he denies dyspnea on exertion, orthopnea, PND, pedal edema, palpitations, syncope or chest pain.   Current Outpatient Prescriptions  Medication Sig Dispense Refill  . allopurinol (ZYLOPRIM) 100 MG tablet Take 200 mg by mouth daily.       Marland Kitchen aspirin 325 MG tablet Take 325 mg by mouth daily.        . benazepril (LOTENSIN) 10 MG tablet Take 10 mg by mouth daily. Along with the 20mg  to equal 30mg  daily      . benazepril (LOTENSIN) 20 MG tablet Take 20 mg by mouth daily. Along with the 10mg  to equal 30 daily      . carvedilol (COREG) 25 MG tablet Take 1 tablet (25 mg total) by mouth 2 (two) times daily with a meal.  180 tablet  2  . escitalopram (LEXAPRO) 5 MG tablet Take 5 mg by mouth daily.      . finasteride (PROSCAR) 5 MG tablet Take 5 mg by mouth daily.      Marland Kitchen omeprazole (PRILOSEC OTC) 20 MG tablet Take 20 mg by mouth daily.      . simvastatin (ZOCOR) 40 MG tablet Take 40 mg by mouth every evening.      Marland Kitchen LORazepam (ATIVAN) 0.5 MG tablet Take 0.25 mg by mouth daily as needed. For anxiety      . DISCONTD: benazepril (LOTENSIN) 10 MG tablet TAKE 1 TABLET (10 MG TOTAL) BY MOUTH DAILY.  90 tablet  10  . DISCONTD: benazepril (LOTENSIN) 20 MG tablet Take 1 tablet (20 mg total) by mouth daily.  30 tablet  2  . DISCONTD: simvastatin (ZOCOR) 40 MG tablet TAKE 1 TABLET (40 MG TOTAL) BY MOUTH AT BEDTIME.  90 tablet  2     Past Medical History  Diagnosis Date  . Ischemic cardiomyopathy   . Renal insufficiency   . Hyperlipemia   . HTN (hypertension)   . Myocardial infarct 1988  . LV dysfunction   . Hyperkalemia   . Ventricular tachycardia   . CHF  (congestive heart failure)   . Syncope and collapse   . Heart attack   . Enlarged prostate   . Coronary artery disease     Hx of cardiac caths   . GERD (gastroesophageal reflux disease)     Takes prilosec  . Anxiety     Takes Ativan as needed  . Panic attacks   . Gout   . Personal history of peptic ulcer disease   . Cardiac dysrhythmia, unspecified     Past Surgical History  Procedure Date  . Cardiac catheterization 2003  . Coronary angioplasty   . Cardiac defibrillator placement   . Colonoscopy   . Esophagogastroduodenoscopy     with biopsy  . Insert / replace / remove pacemaker 2005    ICD; replaced in Jan 2013  . Hernia repair 01/01/2012  . Umbilical hernia repair   . Umbilical hernia repair 01/01/2012    Procedure: HERNIA REPAIR UMBILICAL ADULT;  Surgeon: Robyne Askew, MD;  Location: [facility] OR;  Service: General;  Laterality: N/A;  umbilical hernia repair with mesh    History   Social  History  . Marital Status: Married    Spouse Name: N/A    Number of Children: N/A  . Years of Education: N/A   Occupational History  . Not on file.   Social History Main Topics  . Smoking status: Never Smoker   . Smokeless tobacco: Never Used  . Alcohol Use: Yes     occassionally  . Drug Use: No  . Sexually Active: Not Currently   Other Topics Concern  . Not on file   Social History Narrative  . No narrative on file    ROS: some fatigue but no fevers or chills, productive cough, hemoptysis, dysphasia, odynophagia, melena, hematochezia, dysuria, hematuria, rash, seizure activity, orthopnea, PND, pedal edema, claudication. Remaining systems are negative.  Physical Exam: Well-developed well-nourished in no acute distress.  Skin is warm and dry.  HEENT is normal.  Neck is supple.  Chest is clear to auscultation with normal expansion.  Cardiovascular exam is regular rate and rhythm.  Abdominal exam nontender or distended. No masses palpated. Extremities show no  edema. neuro grossly intact  ECG sinus rhythm at a rate of 61. Anterior T wave inversion. Lateral T-wave inversion.

## 2012-07-07 NOTE — Assessment & Plan Note (Signed)
Continue statin. We will obtain most recent laboratories from his primary care physician.

## 2012-07-07 NOTE — Assessment & Plan Note (Signed)
Management per electrophysiology. 

## 2012-07-07 NOTE — Assessment & Plan Note (Signed)
Blood pressure controlled. Continue present medications. Will obtain most recent potassium and renal function from primary care.

## 2012-07-07 NOTE — Assessment & Plan Note (Signed)
Continue ACE inhibitor and beta blocker. 

## 2012-07-07 NOTE — Patient Instructions (Addendum)
Your physician wants you to follow-up in: ONE YEAR WITH DR CRENSHAW You will receive a reminder letter in the mail two months in advance. If you don't receive a letter, please call our office to schedule the follow-up appointment.  

## 2012-07-07 NOTE — Assessment & Plan Note (Signed)
Continue aspirin and statin. 

## 2012-07-27 ENCOUNTER — Other Ambulatory Visit: Payer: Self-pay | Admitting: Cardiology

## 2012-08-24 ENCOUNTER — Ambulatory Visit (INDEPENDENT_AMBULATORY_CARE_PROVIDER_SITE_OTHER): Payer: Medicare Other | Admitting: *Deleted

## 2012-08-24 ENCOUNTER — Encounter: Payer: Self-pay | Admitting: Internal Medicine

## 2012-08-24 DIAGNOSIS — T82110A Breakdown (mechanical) of cardiac electrode, initial encounter: Secondary | ICD-10-CM

## 2012-08-24 DIAGNOSIS — I509 Heart failure, unspecified: Secondary | ICD-10-CM

## 2012-08-24 DIAGNOSIS — I255 Ischemic cardiomyopathy: Secondary | ICD-10-CM

## 2012-08-24 DIAGNOSIS — I2589 Other forms of chronic ischemic heart disease: Secondary | ICD-10-CM

## 2012-08-31 LAB — REMOTE ICD DEVICE
DEV-0020ICD: NEGATIVE
FVT: 0
HV IMPEDENCE: 63 Ohm
PACEART VT: 0
RV LEAD IMPEDENCE ICD: 456 Ohm
RV LEAD THRESHOLD: 0.375 V
TOT-0001: 1
TZAT-0001FASTVT: 1
TZAT-0001SLOWVT: 1
TZAT-0001SLOWVT: 2
TZAT-0002SLOWVT: NEGATIVE
TZAT-0002SLOWVT: NEGATIVE
TZAT-0012FASTVT: 200 ms
TZAT-0018FASTVT: NEGATIVE
TZAT-0019SLOWVT: 8 V
TZAT-0019SLOWVT: 8 V
TZON-0003VSLOWVT: 340 ms
TZST-0001FASTVT: 4
TZST-0001FASTVT: 5
TZST-0001SLOWVT: 3
TZST-0001SLOWVT: 4
TZST-0001SLOWVT: 5
TZST-0002FASTVT: NEGATIVE
TZST-0002FASTVT: NEGATIVE
TZST-0002FASTVT: NEGATIVE
TZST-0002SLOWVT: NEGATIVE
TZST-0002SLOWVT: NEGATIVE
VF: 0

## 2012-09-16 ENCOUNTER — Encounter: Payer: Self-pay | Admitting: *Deleted

## 2012-12-31 ENCOUNTER — Encounter: Payer: Self-pay | Admitting: Internal Medicine

## 2012-12-31 ENCOUNTER — Ambulatory Visit (INDEPENDENT_AMBULATORY_CARE_PROVIDER_SITE_OTHER): Payer: Medicare Other | Admitting: Internal Medicine

## 2012-12-31 VITALS — BP 149/71 | HR 57 | Ht 67.0 in | Wt 185.4 lb

## 2012-12-31 DIAGNOSIS — I472 Ventricular tachycardia: Secondary | ICD-10-CM

## 2012-12-31 LAB — ICD DEVICE OBSERVATION
BATTERY VOLTAGE: 3.196 V
BRDY-0002RV: 40 {beats}/min
FVT: 0
HV IMPEDENCE: 60 Ohm
PACEART VT: 0
TOT-0006: 20130111000000
TZAT-0001FASTVT: 1
TZAT-0001SLOWVT: 1
TZAT-0001SLOWVT: 2
TZAT-0002FASTVT: NEGATIVE
TZAT-0002SLOWVT: NEGATIVE
TZAT-0002SLOWVT: NEGATIVE
TZAT-0012FASTVT: 200 ms
TZAT-0012SLOWVT: 200 ms
TZAT-0012SLOWVT: 200 ms
TZAT-0018FASTVT: NEGATIVE
TZAT-0018SLOWVT: NEGATIVE
TZAT-0019FASTVT: 8 V
TZAT-0019SLOWVT: 8 V
TZON-0003VSLOWVT: 340 ms
TZON-0004SLOWVT: 16
TZST-0001FASTVT: 4
TZST-0001FASTVT: 5
TZST-0001FASTVT: 6
TZST-0001SLOWVT: 3
TZST-0001SLOWVT: 5
TZST-0002FASTVT: NEGATIVE
TZST-0002FASTVT: NEGATIVE
TZST-0002FASTVT: NEGATIVE
TZST-0002FASTVT: NEGATIVE
TZST-0002SLOWVT: NEGATIVE
TZST-0002SLOWVT: NEGATIVE
VENTRICULAR PACING ICD: 0.17 pct

## 2012-12-31 NOTE — Assessment & Plan Note (Signed)
Since his last visit, he has had no recurrent ventricular arrhythmias, he will continue his current medical therapy.

## 2012-12-31 NOTE — Patient Instructions (Addendum)
Your physician wants you to follow-up in: 12 months with Dr. Taylor. You will receive a reminder letter in the mail two months in advance. If you don't receive a letter, please call our office to schedule the follow-up appointment.    

## 2012-12-31 NOTE — Progress Notes (Signed)
HPI Adrian Pace returns today for followup. He is a very pleasant 79 year old man with an ischemic cardiomyopathy, chronic systolic heart failure, ventricular tachycardia, and hypertension. In the interim, he has done well. He denies chest pain, shortness of breath, or syncope. No peripheral edema. He has had no ICD shock. Allergies  Allergen Reactions  . Codeine Anxiety     Current Outpatient Prescriptions  Medication Sig Dispense Refill  . allopurinol (ZYLOPRIM) 100 MG tablet Take 200 mg by mouth daily.       Marland Kitchen aspirin 325 MG tablet Take 325 mg by mouth daily.        . benazepril (LOTENSIN) 10 MG tablet TAKE 1 TABLET (10 MG TOTAL) BY MOUTH DAILY.  90 tablet  3  . benazepril (LOTENSIN) 20 MG tablet TAKE 1 TABLET (20 MG TOTAL) BY MOUTH DAILY.  90 tablet  3  . carvedilol (COREG) 25 MG tablet Take 1 tablet (25 mg total) by mouth 2 (two) times daily with a meal.  180 tablet  2  . escitalopram (LEXAPRO) 5 MG tablet Take 5 mg by mouth daily.      . finasteride (PROSCAR) 5 MG tablet Take 5 mg by mouth daily.      Marland Kitchen LORazepam (ATIVAN) 0.5 MG tablet Take 0.25 mg by mouth daily as needed. For anxiety      . omeprazole (PRILOSEC OTC) 20 MG tablet Take 20 mg by mouth daily.      . simvastatin (ZOCOR) 40 MG tablet Take 40 mg by mouth every evening.       No current facility-administered medications for this visit.     Past Medical History  Diagnosis Date  . Ischemic cardiomyopathy   . Renal insufficiency   . Hyperlipemia   . HTN (hypertension)   . Myocardial infarct 1988  . LV dysfunction   . Hyperkalemia   . Ventricular tachycardia   . CHF (congestive heart failure)   . Syncope and collapse   . Heart attack   . Enlarged prostate   . Coronary artery disease     Hx of cardiac caths   . GERD (gastroesophageal reflux disease)     Takes prilosec  . Anxiety     Takes Ativan as needed  . Panic attacks   . Gout   . Personal history of peptic ulcer disease   . Cardiac dysrhythmia,  unspecified     ROS:   All systems reviewed and negative except as noted in the HPI.   Past Surgical History  Procedure Laterality Date  . Cardiac catheterization  2003  . Coronary angioplasty    . Cardiac defibrillator placement    . Colonoscopy    . Esophagogastroduodenoscopy      with biopsy  . Insert / replace / remove pacemaker  2005    ICD; replaced in Jan 2013  . Hernia repair  01/01/2012  . Umbilical hernia repair    . Umbilical hernia repair  01/01/2012    Procedure: HERNIA REPAIR UMBILICAL ADULT;  Surgeon: Robyne Askew, MD;  Location: [facility] OR;  Service: General;  Laterality: N/A;  umbilical hernia repair with mesh     Family History  Problem Relation Age of Onset  . Heart attack Father     x2  . Heart disease Father   . Cancer Father     bladder and prostate  . Anesthesia problems Neg Hx   . Hypotension Neg Hx   . Malignant hyperthermia Neg Hx   . Pseudochol  deficiency Neg Hx      History   Social History  . Marital Status: Married    Spouse Name: N/A    Number of Children: N/A  . Years of Education: N/A   Occupational History  . Not on file.   Social History Main Topics  . Smoking status: Never Smoker   . Smokeless tobacco: Never Used  . Alcohol Use: Yes     Comment: occassionally  . Drug Use: No  . Sexually Active: Not Currently   Other Topics Concern  . Not on file   Social History Narrative  . No narrative on file     BP 149/71  Pulse 57  Ht 5\' 7"  (1.702 m)  Wt 185 lb 6.4 oz (84.097 kg)  BMI 29.03 kg/m2  Physical Exam:  Well appearing 79 year old man,NAD HEENT: Unremarkable Neck:  7 cm JVD, no thyromegally Lungs:  Clear with no wheezes, rales, or rhonchi. HEART:  Regular rate rhythm, no murmurs, no rubs, no clicks Abd:  soft, positive bowel sounds, no organomegally, no rebound, no guarding Ext:  2 plus pulses, no edema, no cyanosis, no clubbing Skin:  No rashes no nodules Neuro:  CN II through XII intact, motor grossly  intact  DEVICE  Normal device function.  See PaceArt for details.   Assess/Plan:

## 2012-12-31 NOTE — Assessment & Plan Note (Signed)
He denies anginal symptoms. He will continue his current medical therapy. We spent a significant amount of time today discussing exercise and his exercise limitations. I've asked the patient start walking on a daily basis. We discussed the intensity of exercise and whether or not he could use weights for exercise. I've asked him to do all things of moderation.

## 2012-12-31 NOTE — Assessment & Plan Note (Signed)
His Medtronic ICD is working normally. We'll plan recheck in several months.

## 2013-01-04 ENCOUNTER — Encounter: Payer: Medicare Other | Admitting: Internal Medicine

## 2013-02-14 ENCOUNTER — Other Ambulatory Visit: Payer: Self-pay | Admitting: Internal Medicine

## 2013-02-15 ENCOUNTER — Encounter: Payer: Self-pay | Admitting: Emergency Medicine

## 2013-03-29 ENCOUNTER — Encounter: Payer: Self-pay | Admitting: Internal Medicine

## 2013-03-29 ENCOUNTER — Ambulatory Visit (INDEPENDENT_AMBULATORY_CARE_PROVIDER_SITE_OTHER): Payer: Medicare Other | Admitting: *Deleted

## 2013-03-29 DIAGNOSIS — Z95810 Presence of automatic (implantable) cardiac defibrillator: Secondary | ICD-10-CM

## 2013-03-29 DIAGNOSIS — I509 Heart failure, unspecified: Secondary | ICD-10-CM

## 2013-03-29 DIAGNOSIS — I2589 Other forms of chronic ischemic heart disease: Secondary | ICD-10-CM

## 2013-03-29 DIAGNOSIS — I255 Ischemic cardiomyopathy: Secondary | ICD-10-CM

## 2013-03-29 LAB — REMOTE ICD DEVICE
CHARGE TIME: 8.788 s
DEV-0020ICD: NEGATIVE
HV IMPEDENCE: 62 Ohm
RV LEAD AMPLITUDE: 7.8 mv
RV LEAD IMPEDENCE ICD: 418 Ohm
RV LEAD THRESHOLD: 0.5 V
TOT-0001: 1
TOT-0002: 0
TOT-0006: 20130111000000
TZAT-0012FASTVT: 200 ms
TZAT-0019SLOWVT: 8 V
TZAT-0020FASTVT: 1.5 ms
TZAT-0020SLOWVT: 1.5 ms
TZAT-0020SLOWVT: 1.5 ms
TZON-0003FASTVT: 270 ms
TZON-0003SLOWVT: 400 ms
TZON-0004VSLOWVT: 32
TZST-0001FASTVT: 2
TZST-0001FASTVT: 3
TZST-0001SLOWVT: 3
TZST-0001SLOWVT: 4
TZST-0002FASTVT: NEGATIVE
TZST-0002FASTVT: NEGATIVE
TZST-0002FASTVT: NEGATIVE
TZST-0002SLOWVT: NEGATIVE

## 2013-05-07 ENCOUNTER — Encounter: Payer: Self-pay | Admitting: *Deleted

## 2013-07-06 ENCOUNTER — Ambulatory Visit (INDEPENDENT_AMBULATORY_CARE_PROVIDER_SITE_OTHER): Payer: Medicare Other | Admitting: *Deleted

## 2013-07-06 ENCOUNTER — Ambulatory Visit: Payer: Medicare Other | Admitting: *Deleted

## 2013-07-06 DIAGNOSIS — I255 Ischemic cardiomyopathy: Secondary | ICD-10-CM

## 2013-07-06 DIAGNOSIS — Z95810 Presence of automatic (implantable) cardiac defibrillator: Secondary | ICD-10-CM

## 2013-07-06 DIAGNOSIS — I509 Heart failure, unspecified: Secondary | ICD-10-CM

## 2013-07-06 DIAGNOSIS — I2589 Other forms of chronic ischemic heart disease: Secondary | ICD-10-CM

## 2013-07-14 LAB — REMOTE ICD DEVICE
BATTERY VOLTAGE: 3.1824 V
BRDY-0002RV: 40 {beats}/min
PACEART VT: 0
TZAT-0001FASTVT: 1
TZAT-0001SLOWVT: 1
TZAT-0001SLOWVT: 2
TZAT-0002FASTVT: NEGATIVE
TZAT-0002SLOWVT: NEGATIVE
TZAT-0002SLOWVT: NEGATIVE
TZAT-0012SLOWVT: 200 ms
TZAT-0018FASTVT: NEGATIVE
TZAT-0018SLOWVT: NEGATIVE
TZAT-0018SLOWVT: NEGATIVE
TZAT-0019FASTVT: 8 V
TZAT-0019SLOWVT: 8 V
TZON-0003VSLOWVT: 340 ms
TZON-0005SLOWVT: 12
TZST-0001FASTVT: 3
TZST-0001FASTVT: 4
TZST-0001FASTVT: 5
TZST-0001SLOWVT: 5
TZST-0001SLOWVT: 6
TZST-0002FASTVT: NEGATIVE
TZST-0002FASTVT: NEGATIVE
TZST-0002SLOWVT: NEGATIVE
TZST-0002SLOWVT: NEGATIVE
TZST-0002SLOWVT: NEGATIVE
VENTRICULAR PACING ICD: 0.24 pct
VF: 0

## 2013-07-19 ENCOUNTER — Ambulatory Visit (INDEPENDENT_AMBULATORY_CARE_PROVIDER_SITE_OTHER): Payer: Medicare Other | Admitting: Cardiology

## 2013-07-19 ENCOUNTER — Encounter: Payer: Self-pay | Admitting: Cardiology

## 2013-07-19 VITALS — BP 108/66 | HR 69 | Ht 67.0 in | Wt 180.0 lb

## 2013-07-19 DIAGNOSIS — Z95810 Presence of automatic (implantable) cardiac defibrillator: Secondary | ICD-10-CM

## 2013-07-19 DIAGNOSIS — I10 Essential (primary) hypertension: Secondary | ICD-10-CM

## 2013-07-19 DIAGNOSIS — R0989 Other specified symptoms and signs involving the circulatory and respiratory systems: Secondary | ICD-10-CM | POA: Insufficient documentation

## 2013-07-19 DIAGNOSIS — I2510 Atherosclerotic heart disease of native coronary artery without angina pectoris: Secondary | ICD-10-CM

## 2013-07-19 DIAGNOSIS — I2589 Other forms of chronic ischemic heart disease: Secondary | ICD-10-CM

## 2013-07-19 DIAGNOSIS — I255 Ischemic cardiomyopathy: Secondary | ICD-10-CM

## 2013-07-19 NOTE — Assessment & Plan Note (Signed)
Ultrasound to exclude aneurysm.

## 2013-07-19 NOTE — Progress Notes (Signed)
HPI: Pleasant male for followup of coronary disease. The patient had his last cardiac catheterization in 2003. He had PCI of his LAD and right coronary artery. His ejection fraction was 30%. Myoview in Sept of 2012 showed EF 22, anteroapical infarct; no ischemia. Patient had ICD system revision in Jan 2013. Since I last saw him in Sept of 2013, he denies dyspnea on exertion, orthopnea, PND, pedal edema, palpitations, syncope or chest pain.   Current Outpatient Prescriptions  Medication Sig Dispense Refill  . aspirin 325 MG tablet Take 325 mg by mouth daily.        . benazepril (LOTENSIN) 10 MG tablet TAKE 1 TABLET (10 MG TOTAL) BY MOUTH DAILY.  90 tablet  3  . benazepril (LOTENSIN) 20 MG tablet TAKE 1 TABLET (20 MG TOTAL) BY MOUTH DAILY.  90 tablet  3  . carvedilol (COREG) 25 MG tablet Take 1 tablet (25 mg total) by mouth 2 (two) times daily with a meal.  180 tablet  2  . ergocalciferol (VITAMIN D2) 50000 UNITS capsule Take 50,000 Units by mouth once a week.      . finasteride (PROSCAR) 5 MG tablet Take 5 mg by mouth daily.      Marland Kitchen omeprazole (PRILOSEC OTC) 20 MG tablet Take 20 mg by mouth daily.      . simvastatin (ZOCOR) 40 MG tablet TAKE 1 TABLET (40 MG TOTAL) BY MOUTH AT BEDTIME.  90 tablet  1   No current facility-administered medications for this visit.     Past Medical History  Diagnosis Date  . Ischemic cardiomyopathy   . Renal insufficiency   . Hyperlipemia   . HTN (hypertension)   . Myocardial infarct 1988  . LV dysfunction   . Hyperkalemia   . Ventricular tachycardia   . CHF (congestive heart failure)   . Syncope and collapse   . Heart attack   . Enlarged prostate   . Coronary artery disease     Hx of cardiac caths   . GERD (gastroesophageal reflux disease)     Takes prilosec  . Anxiety     Takes Ativan as needed  . Panic attacks   . Gout   . Personal history of peptic ulcer disease   . Cardiac dysrhythmia, unspecified     Past Surgical History  Procedure  Laterality Date  . Cardiac catheterization  2003  . Coronary angioplasty    . Cardiac defibrillator placement    . Colonoscopy    . Esophagogastroduodenoscopy      with biopsy  . Insert / replace / remove pacemaker  2005    ICD; replaced in Jan 2013  . Hernia repair  01/01/2012  . Umbilical hernia repair    . Umbilical hernia repair  01/01/2012    Procedure: HERNIA REPAIR UMBILICAL ADULT;  Surgeon: Robyne Askew, MD;  Location: [facility] OR;  Service: General;  Laterality: N/A;  umbilical hernia repair with mesh    History   Social History  . Marital Status: Married    Spouse Name: N/A    Number of Children: N/A  . Years of Education: N/A   Occupational History  . Not on file.   Social History Main Topics  . Smoking status: Never Smoker   . Smokeless tobacco: Never Used  . Alcohol Use: Yes     Comment: occassionally  . Drug Use: No  . Sexual Activity: Not Currently   Other Topics Concern  . Not on file   Social  History Narrative  . No narrative on file    ROS: no fevers or chills, productive cough, hemoptysis, dysphasia, odynophagia, melena, hematochezia, dysuria, hematuria, rash, seizure activity, orthopnea, PND, pedal edema, claudication. Remaining systems are negative.  Physical Exam: Well-developed well-nourished in no acute distress.  Skin is warm and dry.  HEENT is normal.  Neck is supple.  Chest is clear to auscultation with normal expansion.  Cardiovascular exam is regular rate and rhythm.  Abdominal exam nontender or distended. No masses palpated. Positive bruit Extremities show no edema. neuro grossly intact  ECG sinus rhythm at a rate of 69. Left axis deviation. Lateral T-wave inversion. Prior septal infarct.

## 2013-07-19 NOTE — Assessment & Plan Note (Signed)
Continue aspirin and statin. 

## 2013-07-19 NOTE — Patient Instructions (Addendum)
Your physician wants you to follow-up in: ONE YEAR WITH DR CRENSHAW You will receive a reminder letter in the mail two months in advance. If you don't receive a letter, please call our office to schedule the follow-up appointment.   Your physician has requested that you have an abdominal aorta duplex. During this test, an ultrasound is used to evaluate the aorta. Allow 30 minutes for this exam. Do not eat after midnight the day before and avoid carbonated beverages  

## 2013-07-19 NOTE — Assessment & Plan Note (Signed)
Continue statin. Lipids and liver monitored by primary care. 

## 2013-07-19 NOTE — Assessment & Plan Note (Signed)
Blood pressure controlled. Continue present medications. 

## 2013-07-19 NOTE — Assessment & Plan Note (Signed)
Continue ACE inhibitor and beta blocker. 

## 2013-07-19 NOTE — Assessment & Plan Note (Signed)
Followed by electrophysiology. 

## 2013-07-23 ENCOUNTER — Encounter: Payer: Self-pay | Admitting: *Deleted

## 2013-07-31 ENCOUNTER — Encounter: Payer: Self-pay | Admitting: Internal Medicine

## 2013-08-03 ENCOUNTER — Ambulatory Visit (HOSPITAL_COMMUNITY): Payer: Medicare Other | Attending: Cardiology

## 2013-08-03 DIAGNOSIS — I2510 Atherosclerotic heart disease of native coronary artery without angina pectoris: Secondary | ICD-10-CM | POA: Insufficient documentation

## 2013-08-03 DIAGNOSIS — I10 Essential (primary) hypertension: Secondary | ICD-10-CM | POA: Insufficient documentation

## 2013-08-03 DIAGNOSIS — E785 Hyperlipidemia, unspecified: Secondary | ICD-10-CM | POA: Insufficient documentation

## 2013-08-03 DIAGNOSIS — R0989 Other specified symptoms and signs involving the circulatory and respiratory systems: Secondary | ICD-10-CM | POA: Insufficient documentation

## 2013-08-05 ENCOUNTER — Telehealth: Payer: Self-pay | Admitting: Cardiology

## 2013-08-05 NOTE — Telephone Encounter (Signed)
Follow Up  Pt returning call for Echo results.

## 2013-08-05 NOTE — Telephone Encounter (Signed)
Spoke with pt, aware of abdominal us results 

## 2013-08-19 ENCOUNTER — Other Ambulatory Visit: Payer: Self-pay | Admitting: Internal Medicine

## 2013-09-02 ENCOUNTER — Other Ambulatory Visit: Payer: Self-pay

## 2013-09-18 ENCOUNTER — Encounter: Payer: Self-pay | Admitting: Cardiology

## 2013-10-11 ENCOUNTER — Ambulatory Visit (INDEPENDENT_AMBULATORY_CARE_PROVIDER_SITE_OTHER): Payer: Medicare Other | Admitting: *Deleted

## 2013-10-11 ENCOUNTER — Encounter: Payer: Self-pay | Admitting: Internal Medicine

## 2013-10-11 DIAGNOSIS — I2589 Other forms of chronic ischemic heart disease: Secondary | ICD-10-CM

## 2013-10-11 DIAGNOSIS — I509 Heart failure, unspecified: Secondary | ICD-10-CM

## 2013-10-11 DIAGNOSIS — I255 Ischemic cardiomyopathy: Secondary | ICD-10-CM

## 2013-10-11 DIAGNOSIS — Z95810 Presence of automatic (implantable) cardiac defibrillator: Secondary | ICD-10-CM

## 2013-10-15 ENCOUNTER — Telehealth: Payer: Self-pay | Admitting: Internal Medicine

## 2013-10-15 LAB — MDC_IDC_ENUM_SESS_TYPE_REMOTE
Battery Voltage: 3.18 V
Brady Statistic RV Percent Paced: 0.2 %
HighPow Impedance: 190 Ohm
HighPow Impedance: 342 Ohm
Lead Channel Impedance Value: 456 Ohm
Lead Channel Sensing Intrinsic Amplitude: 8.75 mV
Lead Channel Setting Pacing Amplitude: 2.5 V
Lead Channel Setting Sensing Sensitivity: 0.3 mV
Zone Setting Detection Interval: 270 ms
Zone Setting Detection Interval: 340 ms

## 2013-10-15 NOTE — Telephone Encounter (Signed)
New message    Got letter stating he did not do his remote transmission but he has a wireless box and said he usually do not do anything.  The green light on the box is on.  Could something be wrong with the box? What will he need to do to transmit?

## 2013-10-18 NOTE — Telephone Encounter (Signed)
Transmission received, patient aware. 

## 2013-10-24 ENCOUNTER — Other Ambulatory Visit: Payer: Self-pay | Admitting: Cardiology

## 2013-10-25 ENCOUNTER — Encounter: Payer: Self-pay | Admitting: *Deleted

## 2014-02-08 ENCOUNTER — Encounter: Payer: Self-pay | Admitting: Internal Medicine

## 2014-02-08 ENCOUNTER — Ambulatory Visit (INDEPENDENT_AMBULATORY_CARE_PROVIDER_SITE_OTHER): Payer: Medicare Other | Admitting: Internal Medicine

## 2014-02-08 VITALS — BP 155/79 | HR 67 | Ht 68.0 in | Wt 183.2 lb

## 2014-02-08 DIAGNOSIS — I509 Heart failure, unspecified: Secondary | ICD-10-CM

## 2014-02-08 DIAGNOSIS — I2589 Other forms of chronic ischemic heart disease: Secondary | ICD-10-CM

## 2014-02-08 DIAGNOSIS — Z95810 Presence of automatic (implantable) cardiac defibrillator: Secondary | ICD-10-CM

## 2014-02-08 DIAGNOSIS — I472 Ventricular tachycardia: Secondary | ICD-10-CM

## 2014-02-08 DIAGNOSIS — I255 Ischemic cardiomyopathy: Secondary | ICD-10-CM

## 2014-02-08 DIAGNOSIS — I4729 Other ventricular tachycardia: Secondary | ICD-10-CM

## 2014-02-08 LAB — MDC_IDC_ENUM_SESS_TYPE_INCLINIC
Battery Voltage: 3.16 V
Brady Statistic RV Percent Paced: 0.15 %
Date Time Interrogation Session: 20150414125100
HIGH POWER IMPEDANCE MEASURED VALUE: 171 Ohm
HIGH POWER IMPEDANCE MEASURED VALUE: 342 Ohm
HIGH POWER IMPEDANCE MEASURED VALUE: 61 Ohm
Lead Channel Impedance Value: 418 Ohm
Lead Channel Sensing Intrinsic Amplitude: 8.875 mV
Lead Channel Sensing Intrinsic Amplitude: 9 mV
Lead Channel Setting Pacing Amplitude: 2.5 V
Lead Channel Setting Sensing Sensitivity: 0.3 mV
MDC IDC MSMT LEADCHNL RV PACING THRESHOLD AMPLITUDE: 0.5 V
MDC IDC MSMT LEADCHNL RV PACING THRESHOLD PULSEWIDTH: 0.4 ms
MDC IDC SET LEADCHNL RV PACING PULSEWIDTH: 0.4 ms
MDC IDC SET ZONE DETECTION INTERVAL: 270 ms
MDC IDC SET ZONE DETECTION INTERVAL: 400 ms
Zone Setting Detection Interval: 300 ms
Zone Setting Detection Interval: 340 ms

## 2014-02-08 NOTE — Assessment & Plan Note (Signed)
His VT has been quiet. He will continue his current meds.  

## 2014-02-08 NOTE — Assessment & Plan Note (Signed)
He denies anginal symptoms. He will continue his current medical therapy. 

## 2014-02-08 NOTE — Progress Notes (Signed)
HPI Adrian Pace returns today for followup. He is a very pleasant 80 year old man with an ischemic cardiomyopathy, chronic systolic heart failure, ventricular tachycardia, and hypertension. In the interim, he has done well. He denies chest pain, shortness of breath, or syncope. No peripheral edema. He has had no ICD shock. Allergies  Allergen Reactions  . Codeine Anxiety     Current Outpatient Prescriptions  Medication Sig Dispense Refill  . aspirin 325 MG tablet Take 325 mg by mouth daily.        . benazepril (LOTENSIN) 10 MG tablet TAKE 1 TABLET (10 MG TOTAL) BY MOUTH DAILY.  90 tablet  2  . benazepril (LOTENSIN) 20 MG tablet TAKE 1 TABLET (20 MG TOTAL) BY MOUTH DAILY.  90 tablet  2  . carvedilol (COREG) 25 MG tablet Take 1 tablet (25 mg total) by mouth 2 (two) times daily with a meal.  180 tablet  2  . finasteride (PROSCAR) 5 MG tablet Take 5 mg by mouth daily.      Marland Kitchen LORazepam (ATIVAN) 0.5 MG tablet Take 1 tablet by mouth as needed.      Marland Kitchen omeprazole (PRILOSEC OTC) 20 MG tablet Take 20 mg by mouth daily.      . simvastatin (ZOCOR) 40 MG tablet TAKE 1 TABLET (40 MG TOTAL) BY MOUTH AT BEDTIME.  90 tablet  2  . Vitamin D, Cholecalciferol, 1000 UNITS TABS Take 1 tablet by mouth daily.       No current facility-administered medications for this visit.     Past Medical History  Diagnosis Date  . Ischemic cardiomyopathy   . Renal insufficiency   . Hyperlipemia   . HTN (hypertension)   . Myocardial infarct 1988  . LV dysfunction   . Hyperkalemia   . Ventricular tachycardia   . CHF (congestive heart failure)   . Syncope and collapse   . Heart attack   . Enlarged prostate   . Coronary artery disease     Hx of cardiac caths   . GERD (gastroesophageal reflux disease)     Takes prilosec  . Anxiety     Takes Ativan as needed  . Panic attacks   . Gout   . Personal history of peptic ulcer disease   . Cardiac dysrhythmia, unspecified     ROS:   All systems reviewed and negative  except as noted in the HPI.   Past Surgical History  Procedure Laterality Date  . Cardiac catheterization  2003  . Coronary angioplasty    . Cardiac defibrillator placement    . Colonoscopy    . Esophagogastroduodenoscopy      with biopsy  . Insert / replace / remove pacemaker  2005    ICD; replaced in Jan 2013  . Hernia repair  01/01/2012  . Umbilical hernia repair    . Umbilical hernia repair  01/01/2012    Procedure: HERNIA REPAIR UMBILICAL ADULT;  Surgeon: Merrie Roof, MD;  Location: [place];  Service: General;  Laterality: N/A;  umbilical hernia repair with mesh     Family History  Problem Relation Age of Onset  . Heart attack Father     x2  . Heart disease Father   . Cancer Father     bladder and prostate  . Anesthesia problems Neg Hx   . Hypotension Neg Hx   . Malignant hyperthermia Neg Hx   . Pseudochol deficiency Neg Hx      History   Social History  . Marital  Status: Married    Spouse Name: N/A    Number of Children: N/A  . Years of Education: N/A   Occupational History  . Not on file.   Social History Main Topics  . Smoking status: Never Smoker   . Smokeless tobacco: Never Used  . Alcohol Use: Yes     Comment: occassionally  . Drug Use: No  . Sexual Activity: Not Currently   Other Topics Concern  . Not on file   Social History Narrative  . No narrative on file     BP 155/79  Pulse 67  Ht 5\' 8"  (1.727 m)  Wt 183 lb 3.2 oz (83.099 kg)  BMI 27.86 kg/m2  Physical Exam:  Well appearing 80 year old man,NAD HEENT: Unremarkable Neck:  7 cm JVD, no thyromegally Lungs:  Clear with no wheezes, rales, or rhonchi. HEART:  Regular rate rhythm, no murmurs, no rubs, no clicks Abd:  soft, positive bowel sounds, no organomegally, no rebound, no guarding Ext:  2 plus pulses, no edema, no cyanosis, no clubbing Skin:  No rashes no nodules Neuro:  CN II through XII intact, motor grossly intact  DEVICE  Normal device function.  See PaceArt for  details.   Assess/Plan:

## 2014-02-08 NOTE — Assessment & Plan Note (Signed)
His Medtronic ICD is working normally. Will recheck in several months. 

## 2014-02-08 NOTE — Patient Instructions (Signed)
Your physician wants you to follow-up in: 12 months with Dr Knox Saliva will receive a reminder letter in the mail two months in advance. If you don't receive a letter, please call our office to schedule the follow-up appointment.  Remote monitoring is used to monitor your Pacemaker or ICD from home. This monitoring reduces the number of office visits required to check your device to one time per year. It allows [place] to keep an eye on the functioning of your device to ensure it is working properly. You are scheduled for a device check from home on 05/12/14. You may send your transmission at any time that day. If you have a wireless device, the transmission will be sent automatically. After your physician reviews your transmission, you will receive a postcard with your next transmission date.

## 2014-02-09 ENCOUNTER — Other Ambulatory Visit: Payer: Self-pay | Admitting: Internal Medicine

## 2014-05-04 ENCOUNTER — Telehealth: Payer: Self-pay | Admitting: Cardiology

## 2014-05-04 NOTE — Telephone Encounter (Signed)
Closed encounter °

## 2014-05-12 ENCOUNTER — Telehealth: Payer: Self-pay | Admitting: Cardiology

## 2014-05-12 ENCOUNTER — Ambulatory Visit (INDEPENDENT_AMBULATORY_CARE_PROVIDER_SITE_OTHER): Payer: Medicare Other | Admitting: *Deleted

## 2014-05-12 DIAGNOSIS — I4729 Other ventricular tachycardia: Secondary | ICD-10-CM

## 2014-05-12 DIAGNOSIS — I2589 Other forms of chronic ischemic heart disease: Secondary | ICD-10-CM

## 2014-05-12 DIAGNOSIS — I509 Heart failure, unspecified: Secondary | ICD-10-CM

## 2014-05-12 DIAGNOSIS — I472 Ventricular tachycardia, unspecified: Secondary | ICD-10-CM

## 2014-05-12 DIAGNOSIS — I255 Ischemic cardiomyopathy: Secondary | ICD-10-CM

## 2014-05-12 NOTE — Progress Notes (Signed)
Remote ICD transmission.   

## 2014-05-12 NOTE — Telephone Encounter (Signed)
Spoke with pt and reminded pt of remote transmission that is due today. Pt verbalized understanding.   

## 2014-05-16 LAB — MDC_IDC_ENUM_SESS_TYPE_REMOTE
Date Time Interrogation Session: 20150716164500
HIGH POWER IMPEDANCE MEASURED VALUE: 171 Ohm
HIGH POWER IMPEDANCE MEASURED VALUE: 342 Ohm
HIGH POWER IMPEDANCE MEASURED VALUE: 60 Ohm
Lead Channel Impedance Value: 456 Ohm
Lead Channel Pacing Threshold Pulse Width: 0.4 ms
Lead Channel Sensing Intrinsic Amplitude: 8.375 mV
Lead Channel Sensing Intrinsic Amplitude: 8.375 mV
Lead Channel Setting Pacing Amplitude: 2.5 V
MDC IDC MSMT BATTERY VOLTAGE: 3.12 V
MDC IDC MSMT LEADCHNL RV PACING THRESHOLD AMPLITUDE: 0.5 V
MDC IDC SET LEADCHNL RV PACING PULSEWIDTH: 0.4 ms
MDC IDC SET LEADCHNL RV SENSING SENSITIVITY: 0.3 mV
MDC IDC SET ZONE DETECTION INTERVAL: 400 ms
MDC IDC STAT BRADY RV PERCENT PACED: 0.05 %
Zone Setting Detection Interval: 270 ms
Zone Setting Detection Interval: 300 ms
Zone Setting Detection Interval: 340 ms

## 2014-06-03 ENCOUNTER — Encounter: Payer: Self-pay | Admitting: Cardiology

## 2014-06-09 ENCOUNTER — Encounter: Payer: Self-pay | Admitting: Internal Medicine

## 2014-08-05 ENCOUNTER — Other Ambulatory Visit: Payer: Self-pay | Admitting: Cardiology

## 2014-08-15 ENCOUNTER — Ambulatory Visit: Payer: Medicare Other | Admitting: *Deleted

## 2014-08-15 ENCOUNTER — Ambulatory Visit (INDEPENDENT_AMBULATORY_CARE_PROVIDER_SITE_OTHER): Payer: Medicare Other | Admitting: Cardiology

## 2014-08-15 ENCOUNTER — Encounter: Payer: Self-pay | Admitting: Cardiology

## 2014-08-15 ENCOUNTER — Telehealth: Payer: Self-pay | Admitting: Cardiology

## 2014-08-15 VITALS — BP 108/64 | HR 73 | Ht 67.0 in | Wt 178.9 lb

## 2014-08-15 DIAGNOSIS — I2510 Atherosclerotic heart disease of native coronary artery without angina pectoris: Secondary | ICD-10-CM

## 2014-08-15 DIAGNOSIS — I2583 Coronary atherosclerosis due to lipid rich plaque: Secondary | ICD-10-CM

## 2014-08-15 DIAGNOSIS — I10 Essential (primary) hypertension: Secondary | ICD-10-CM

## 2014-08-15 DIAGNOSIS — I255 Ischemic cardiomyopathy: Secondary | ICD-10-CM

## 2014-08-15 DIAGNOSIS — Z95810 Presence of automatic (implantable) cardiac defibrillator: Secondary | ICD-10-CM

## 2014-08-15 NOTE — Assessment & Plan Note (Signed)
Continue ACE inhibitor and beta blocker. 

## 2014-08-15 NOTE — Assessment & Plan Note (Signed)
Followed by electrophysiology. 

## 2014-08-15 NOTE — Patient Instructions (Signed)
Your physician wants you to follow-up in: ONE YEAR WITH DR CRENSHAW You will receive a reminder letter in the mail two months in advance. If you don't receive a letter, please call our office to schedule the follow-up appointment.  

## 2014-08-15 NOTE — Assessment & Plan Note (Signed)
Continue statin. Lipids and liver monitored by primary care. 

## 2014-08-15 NOTE — Assessment & Plan Note (Signed)
Continue aspirin and statin. 

## 2014-08-15 NOTE — Assessment & Plan Note (Signed)
Blood pressure controlled. Continue present medications. Potassium and renal function monitored by primary care. 

## 2014-08-15 NOTE — Telephone Encounter (Signed)
Spoke with pt and reminded pt of remote transmission that is due today. Pt verbalized understanding.   

## 2014-08-15 NOTE — Progress Notes (Signed)
HPI: FU coronary disease. The patient had his last cardiac catheterization in 2003. He had PCI of his LAD and right coronary artery. His ejection fraction was 30%. Myoview in Sept of 2012 showed EF 22, anteroapical infarct; no ischemia. Patient had ICD system revision in Jan 2013. Abd ultrasound 10/14 showed not aneurysm. Since I last saw him, he denies dyspnea on exertion, orthopnea, PND, pedal edema, palpitations, syncope or chest pain.   Current Outpatient Prescriptions  Medication Sig Dispense Refill  . aspirin 325 MG tablet Take 325 mg by mouth daily.        . benazepril (LOTENSIN) 10 MG tablet TAKE 1 TABLET (10 MG TOTAL) BY MOUTH DAILY.  90 tablet  1  . benazepril (LOTENSIN) 20 MG tablet TAKE 1 TABLET (20 MG TOTAL) BY MOUTH DAILY.  90 tablet  1  . carvedilol (COREG) 25 MG tablet Take 1 tablet (25 mg total) by mouth 2 (two) times daily with a meal.  180 tablet  2  . finasteride (PROSCAR) 5 MG tablet Take 5 mg by mouth daily.      Marland Kitchen LORazepam (ATIVAN) 0.5 MG tablet Take 1 tablet by mouth as needed.      Marland Kitchen omeprazole (PRILOSEC OTC) 20 MG tablet Take 20 mg by mouth daily as needed.       . saccharomyces boulardii (FLORASTOR) 250 MG capsule Take 250 mg by mouth 2 (two) times daily.      . simvastatin (ZOCOR) 40 MG tablet TAKE 1 TABLET (40 MG TOTAL) BY MOUTH AT BEDTIME.  90 tablet  1   No current facility-administered medications for this visit.     Past Medical History  Diagnosis Date  . Ischemic cardiomyopathy   . Renal insufficiency   . Hyperlipemia   . HTN (hypertension)   . Myocardial infarct 1988  . LV dysfunction   . Hyperkalemia   . Ventricular tachycardia   . CHF (congestive heart failure)   . Syncope and collapse   . Heart attack   . Enlarged prostate   . Coronary artery disease     Hx of cardiac caths   . GERD (gastroesophageal reflux disease)     Takes prilosec  . Anxiety     Takes Ativan as needed  . Panic attacks   . Gout   . Personal history of peptic  ulcer disease   . Cardiac dysrhythmia, unspecified     Past Surgical History  Procedure Laterality Date  . Cardiac catheterization  2003  . Coronary angioplasty    . Cardiac defibrillator placement    . Colonoscopy    . Esophagogastroduodenoscopy      with biopsy  . Insert / replace / remove pacemaker  2005    ICD; replaced in Jan 2013  . Hernia repair  01/01/2012  . Umbilical hernia repair    . Umbilical hernia repair  01/01/2012    Procedure: HERNIA REPAIR UMBILICAL ADULT;  Surgeon: Merrie Roof, MD;  Location: [place];  Service: General;  Laterality: N/A;  umbilical hernia repair with mesh    History   Social History  . Marital Status: Married    Spouse Name: N/A    Number of Children: N/A  . Years of Education: N/A   Occupational History  . Not on file.   Social History Main Topics  . Smoking status: Never Smoker   . Smokeless tobacco: Never Used  . Alcohol Use: Yes     Comment: occassionally  .  Drug Use: No  . Sexual Activity: Not Currently   Other Topics Concern  . Not on file   Social History Narrative  . No narrative on file    ROS: no fevers or chills, productive cough, hemoptysis, dysphasia, odynophagia, melena, hematochezia, dysuria, hematuria, rash, seizure activity, orthopnea, PND, pedal edema, claudication. Remaining systems are negative.  Physical Exam: Well-developed well-nourished in no acute distress.  Skin is warm and dry.  HEENT is normal.  Neck is supple.  Chest is clear to auscultation with normal expansion.  Cardiovascular exam is regular rate and rhythm.  Abdominal exam nontender or distended. No masses palpated. Extremities show no edema. neuro grossly intact  ECG Sinus rhythm at a rate of 73. Left axis deviation. Prior septal infarct. Occasional PVC. Lateral T-wave inversion. No change compared to September 2014 to

## 2014-08-17 LAB — MDC_IDC_ENUM_SESS_TYPE_REMOTE
Battery Voltage: 3.15 V
Brady Statistic RV Percent Paced: 0.05 %
Date Time Interrogation Session: 20151019155519
HighPow Impedance: 361 Ohm
HighPow Impedance: 60 Ohm
Lead Channel Impedance Value: 456 Ohm
Lead Channel Pacing Threshold Pulse Width: 0.4 ms
Lead Channel Setting Sensing Sensitivity: 0.3 mV
MDC IDC MSMT LEADCHNL RV PACING THRESHOLD AMPLITUDE: 0.5 V
MDC IDC MSMT LEADCHNL RV SENSING INTR AMPL: 8 mV
MDC IDC MSMT LEADCHNL RV SENSING INTR AMPL: 8 mV
MDC IDC SET LEADCHNL RV PACING AMPLITUDE: 2.5 V
MDC IDC SET LEADCHNL RV PACING PULSEWIDTH: 0.4 ms
MDC IDC SET ZONE DETECTION INTERVAL: 300 ms
Zone Setting Detection Interval: 270 ms
Zone Setting Detection Interval: 340 ms
Zone Setting Detection Interval: 400 ms

## 2014-08-21 ENCOUNTER — Other Ambulatory Visit: Payer: Self-pay | Admitting: Internal Medicine

## 2014-09-13 ENCOUNTER — Encounter: Payer: Self-pay | Admitting: Cardiology

## 2014-09-27 ENCOUNTER — Telehealth: Payer: Self-pay | Admitting: Cardiology

## 2014-09-27 NOTE — Telephone Encounter (Signed)
Adrian Pace is calling because he has had a cough and now his bp is in the low 90"s over the mid 60's . He also has a little dizziness when standing up . Please call    Thanks

## 2014-09-27 NOTE — Telephone Encounter (Signed)
Change lisinopril to 2.5 mg daily and follow BP Brian Crenshaw  

## 2014-09-27 NOTE — Telephone Encounter (Signed)
Spoke with pt, for the last two days he has been tired more than usual. He checked his bp and got 83/60 and 93/68. He c/o a little dizziness and feeling unsteady on his feet. The dizziness comes and goes and does not always occur when standing. Medications confirmed. He has a follow up with his primary tomorrow. Will forward for dr Stanford Breed review

## 2014-09-28 NOTE — Telephone Encounter (Signed)
Discussed with dr Stanford Breed, patient instructed to benazepril to 10 mg once daily. He will track his bp and call if he gets concerned.

## 2014-09-30 ENCOUNTER — Encounter: Payer: Self-pay | Admitting: Internal Medicine

## 2014-09-30 ENCOUNTER — Encounter: Payer: Self-pay | Admitting: Cardiology

## 2014-10-06 ENCOUNTER — Encounter (HOSPITAL_COMMUNITY): Payer: Self-pay | Admitting: Internal Medicine

## 2014-11-05 ENCOUNTER — Other Ambulatory Visit: Payer: Self-pay | Admitting: Cardiology

## 2014-11-15 ENCOUNTER — Other Ambulatory Visit: Payer: Self-pay | Admitting: Internal Medicine

## 2014-11-17 ENCOUNTER — Ambulatory Visit (INDEPENDENT_AMBULATORY_CARE_PROVIDER_SITE_OTHER): Payer: Medicare Other | Admitting: *Deleted

## 2014-11-17 ENCOUNTER — Telehealth: Payer: Self-pay | Admitting: Cardiology

## 2014-11-17 DIAGNOSIS — I255 Ischemic cardiomyopathy: Secondary | ICD-10-CM

## 2014-11-17 DIAGNOSIS — I5022 Chronic systolic (congestive) heart failure: Secondary | ICD-10-CM

## 2014-11-17 NOTE — Telephone Encounter (Signed)
Confirmed remote transmission w/ pt wife.   

## 2014-11-21 ENCOUNTER — Encounter: Payer: Self-pay | Admitting: Cardiology

## 2014-11-25 ENCOUNTER — Telehealth: Payer: Self-pay | Admitting: Cardiology

## 2014-11-25 LAB — MDC_IDC_ENUM_SESS_TYPE_REMOTE
Battery Voltage: 3.12 V
Brady Statistic RV Percent Paced: 0.07 %
Date Time Interrogation Session: 20160129215341
HighPow Impedance: 342 Ohm
HighPow Impedance: 54 Ohm
Lead Channel Pacing Threshold Amplitude: 0.375 V
Lead Channel Pacing Threshold Pulse Width: 0.4 ms
Lead Channel Sensing Intrinsic Amplitude: 7.75 mV
Lead Channel Setting Pacing Pulse Width: 0.4 ms
MDC IDC MSMT LEADCHNL RV IMPEDANCE VALUE: 418 Ohm
MDC IDC SET LEADCHNL RV PACING AMPLITUDE: 2.5 V
MDC IDC SET LEADCHNL RV SENSING SENSITIVITY: 0.3 mV
MDC IDC SET ZONE DETECTION INTERVAL: 270 ms
Zone Setting Detection Interval: 300 ms
Zone Setting Detection Interval: 340 ms
Zone Setting Detection Interval: 400 ms

## 2014-11-25 NOTE — Telephone Encounter (Signed)
Informed pt that transmission was not received and requested that he send manual transmission. Pt verbalized understanding.

## 2014-11-25 NOTE — Telephone Encounter (Signed)
New Message    Patient is getting letters about his device is not transmitting/  1. Has your device fired? yes  2. Is you device beeping?no   3. Are you experiencing draining or swelling at device site? N/a   4. Are you calling to see if we received your device transmission? yes  5. Have you passed out?n/a  Please give patient a call back

## 2014-11-25 NOTE — Progress Notes (Signed)
Remote ICD transmission.   

## 2014-12-01 ENCOUNTER — Encounter: Payer: Self-pay | Admitting: Cardiology

## 2014-12-06 ENCOUNTER — Encounter: Payer: Self-pay | Admitting: Internal Medicine

## 2015-02-14 ENCOUNTER — Encounter: Payer: Self-pay | Admitting: Internal Medicine

## 2015-02-14 ENCOUNTER — Ambulatory Visit (INDEPENDENT_AMBULATORY_CARE_PROVIDER_SITE_OTHER): Payer: Medicare Other | Admitting: Internal Medicine

## 2015-02-14 ENCOUNTER — Other Ambulatory Visit: Payer: Self-pay

## 2015-02-14 VITALS — BP 114/70 | HR 74 | Ht 68.0 in | Wt 179.2 lb

## 2015-02-14 DIAGNOSIS — I472 Ventricular tachycardia: Secondary | ICD-10-CM

## 2015-02-14 DIAGNOSIS — I4729 Other ventricular tachycardia: Secondary | ICD-10-CM

## 2015-02-14 DIAGNOSIS — I255 Ischemic cardiomyopathy: Secondary | ICD-10-CM

## 2015-02-14 DIAGNOSIS — Z95810 Presence of automatic (implantable) cardiac defibrillator: Secondary | ICD-10-CM

## 2015-02-14 DIAGNOSIS — I10 Essential (primary) hypertension: Secondary | ICD-10-CM

## 2015-02-14 DIAGNOSIS — I519 Heart disease, unspecified: Secondary | ICD-10-CM | POA: Diagnosis not present

## 2015-02-14 LAB — MDC_IDC_ENUM_SESS_TYPE_INCLINIC
Battery Voltage: 3.13 V
Brady Statistic RV Percent Paced: 0.06 %
Date Time Interrogation Session: 20160419105859
HIGH POWER IMPEDANCE MEASURED VALUE: 171 Ohm
HighPow Impedance: 342 Ohm
HighPow Impedance: 60 Ohm
Lead Channel Impedance Value: 418 Ohm
Lead Channel Setting Pacing Amplitude: 2.5 V
Lead Channel Setting Sensing Sensitivity: 0.3 mV
MDC IDC MSMT LEADCHNL RV PACING THRESHOLD AMPLITUDE: 0.5 V
MDC IDC MSMT LEADCHNL RV PACING THRESHOLD PULSEWIDTH: 0.4 ms
MDC IDC MSMT LEADCHNL RV SENSING INTR AMPL: 8.875 mV
MDC IDC SET LEADCHNL RV PACING PULSEWIDTH: 0.4 ms
Zone Setting Detection Interval: 270 ms
Zone Setting Detection Interval: 300 ms
Zone Setting Detection Interval: 340 ms
Zone Setting Detection Interval: 400 ms

## 2015-02-14 NOTE — Assessment & Plan Note (Signed)
He has had no recurrent ventricular tachycardia. He'll continue his current medical therapy.

## 2015-02-14 NOTE — Patient Instructions (Signed)
Medication Instructions:  Your physician recommends that you continue on your current medications as directed. Please refer to the Current Medication list given to you today.   Labwork: NONE  Testing/Procedures: NONE  Follow-Up: Your physician wants you to follow-up in: 12 months with Dr. Taylor. You will receive a reminder letter in the mail two months in advance. If you don't receive a letter, please call our office to schedule the follow-up appointment.  Remote monitoring is used to monitor your Pacemaker or ICD from home. This monitoring reduces the number of office visits required to check your device to one time per year. It allows us to keep an eye on the functioning of your device to ensure it is working properly. You are scheduled for a device check from home on 05/16/2015. You may send your transmission at any time that day. If you have a wireless device, the transmission will be sent automatically. After your physician reviews your transmission, you will receive a postcard with your next transmission date.    Any Other Special Instructions Will Be Listed Below (If Applicable).   

## 2015-02-14 NOTE — Progress Notes (Signed)
HPI Adrian Pace returns today for followup. He is a very pleasant 81 year old man with an ischemic cardiomyopathy, chronic systolic heart failure, ventricular tachycardia, and hypertension. In the interim, he has done well. He denies chest pain, shortness of breath, or syncope. No peripheral edema. He has had no ICD shock.  Allergies  Allergen Reactions  . Codeine Anxiety     Current Outpatient Prescriptions  Medication Sig Dispense Refill  . aspirin 325 MG tablet Take 325 mg by mouth daily.      . benazepril (LOTENSIN) 10 MG tablet TAKE 1 TABLET (10 MG TOTAL) BY MOUTH DAILY. 30 tablet 0  . carvedilol (COREG) 25 MG tablet TAKE 1 TABLET (25 MG TOTAL) BY MOUTH 2 (TWO) TIMES DAILY WITH A MEAL. 180 tablet 2  . DUREZOL 0.05 % EMUL Place 1 drop into the left eye 4 (four) times daily.    . finasteride (PROSCAR) 5 MG tablet Take 5 mg by mouth daily.    Marland Kitchen LORazepam (ATIVAN) 0.5 MG tablet Take 1 tablet by mouth daily as needed for anxiety.     . simvastatin (ZOCOR) 40 MG tablet TAKE 1 TABLET (40 MG TOTAL) BY MOUTH AT BEDTIME. 90 tablet 2  . VIGAMOX 0.5 % ophthalmic solution Place 1 drop into the left eye 4 (four) times daily. Pt has not started yet     No current facility-administered medications for this visit.     Past Medical History  Diagnosis Date  . Ischemic cardiomyopathy   . Renal insufficiency   . Hyperlipemia   . HTN (hypertension)   . Myocardial infarct 1988  . LV dysfunction   . Hyperkalemia   . Ventricular tachycardia   . CHF (congestive heart failure)   . Syncope and collapse   . Heart attack   . Enlarged prostate   . Coronary artery disease     Hx of cardiac caths   . GERD (gastroesophageal reflux disease)     Takes prilosec  . Anxiety     Takes Ativan as needed  . Panic attacks   . Gout   . Personal history of peptic ulcer disease   . Cardiac dysrhythmia, unspecified     ROS:   All systems reviewed and negative except as noted in the HPI.   Past Surgical  History  Procedure Laterality Date  . Cardiac catheterization  2003  . Coronary angioplasty    . Cardiac defibrillator placement    . Colonoscopy    . Esophagogastroduodenoscopy      with biopsy  . Insert / replace / remove pacemaker  2005    ICD; replaced in Jan 2013  . Hernia repair  01/01/2012  . Umbilical hernia repair    . Umbilical hernia repair  01/01/2012    Procedure: HERNIA REPAIR UMBILICAL ADULT;  Surgeon: Merrie Roof, MD;  Location: [place];  Service: General;  Laterality: N/A;  umbilical hernia repair with mesh  . Lead revision N/A 11/08/2011    Procedure: LEAD REVISION;  Surgeon: Thompson Grayer, MD;  Location[facility];  Service: Cardiovascular;  Laterality: N/A;     Family History  Problem Relation Age of Onset  . Heart attack Father     x2  . Heart disease Father   . Cancer Father     bladder and prostate  . Anesthesia problems Neg Hx   . Hypotension Neg Hx   . Malignant hyperthermia Neg Hx   . Pseudochol deficiency Neg Hx  History   Social History  . Marital Status: Married    Spouse Name: N/A  . Number of Children: N/A  . Years of Education: N/A   Occupational History  . Not on file.   Social History Main Topics  . Smoking status: Never Smoker   . Smokeless tobacco: Never Used  . Alcohol Use: Yes     Comment: occassionally  . Drug Use: No  . Sexual Activity: Not Currently   Other Topics Concern  . Not on file   Social History Narrative     BP 114/70 mmHg  Pulse 74  Ht 5\' 8"  (1.727 m)  Wt 179 lb 3.2 oz (81.285 kg)  BMI 27.25 kg/m2  Physical Exam:  Well appearing 81 year old man,NAD HEENT: Unremarkable Neck:  7 cm JVD, no thyromegally Lungs:  Clear with no wheezes, rales, or rhonchi. HEART:  Regular rate rhythm, no murmurs, no rubs, no clicks Abd:  soft, positive bowel sounds, no organomegally, no rebound, no guarding Ext:  2 plus pulses, no edema, no cyanosis, no clubbing Skin:  No rashes no nodules Neuro:  CN II through  XII intact, motor grossly intact  DEVICE  Normal device function.  See PaceArt for details.   Assess/Plan:

## 2015-02-14 NOTE — Assessment & Plan Note (Signed)
His Medtronic ICD is working normally. We'll plan to recheck in several months. 

## 2015-02-14 NOTE — Assessment & Plan Note (Signed)
His blood pressure is well controlled today. He'll continue his current medications and maintain a low-sodium diet.

## 2015-03-06 ENCOUNTER — Other Ambulatory Visit: Payer: Self-pay | Admitting: Cardiology

## 2015-04-14 ENCOUNTER — Other Ambulatory Visit: Payer: Self-pay | Admitting: Cardiology

## 2015-04-14 NOTE — Telephone Encounter (Signed)
Rx(s) sent to pharmacy electronically.  

## 2015-05-16 ENCOUNTER — Ambulatory Visit (INDEPENDENT_AMBULATORY_CARE_PROVIDER_SITE_OTHER): Payer: Medicare Other | Admitting: *Deleted

## 2015-05-16 DIAGNOSIS — I5022 Chronic systolic (congestive) heart failure: Secondary | ICD-10-CM | POA: Diagnosis not present

## 2015-05-16 DIAGNOSIS — I255 Ischemic cardiomyopathy: Secondary | ICD-10-CM | POA: Diagnosis not present

## 2015-05-16 NOTE — Progress Notes (Signed)
Remote ICD transmission.   

## 2015-05-31 LAB — CUP PACEART REMOTE DEVICE CHECK
Brady Statistic RV Percent Paced: 0.16 %
HighPow Impedance: 342 Ohm
HighPow Impedance: 70 Ohm
Lead Channel Impedance Value: 418 Ohm
Lead Channel Pacing Threshold Amplitude: 0.375 V
Lead Channel Sensing Intrinsic Amplitude: 8.5 mV
Lead Channel Sensing Intrinsic Amplitude: 8.5 mV
Lead Channel Setting Pacing Amplitude: 2.5 V
Lead Channel Setting Pacing Pulse Width: 0.4 ms
MDC IDC MSMT BATTERY VOLTAGE: 3.09 V
MDC IDC MSMT LEADCHNL RV PACING THRESHOLD PULSEWIDTH: 0.4 ms
MDC IDC SESS DTM: 20160719073626
MDC IDC SET LEADCHNL RV SENSING SENSITIVITY: 0.3 mV
MDC IDC SET ZONE DETECTION INTERVAL: 270 ms
MDC IDC SET ZONE DETECTION INTERVAL: 400 ms
Zone Setting Detection Interval: 300 ms
Zone Setting Detection Interval: 340 ms

## 2015-06-01 ENCOUNTER — Other Ambulatory Visit: Payer: Self-pay | Admitting: *Deleted

## 2015-06-01 MED ORDER — CARVEDILOL 25 MG PO TABS: ORAL_TABLET | ORAL | Status: DC

## 2015-06-01 MED ORDER — SIMVASTATIN 40 MG PO TABS: ORAL_TABLET | ORAL | Status: DC

## 2015-06-29 ENCOUNTER — Encounter: Payer: Self-pay | Admitting: Cardiology

## 2015-07-12 ENCOUNTER — Encounter: Payer: Self-pay | Admitting: Internal Medicine

## 2015-08-21 ENCOUNTER — Ambulatory Visit (INDEPENDENT_AMBULATORY_CARE_PROVIDER_SITE_OTHER): Payer: Medicare Other | Admitting: *Deleted

## 2015-08-21 DIAGNOSIS — I5022 Chronic systolic (congestive) heart failure: Secondary | ICD-10-CM

## 2015-08-21 DIAGNOSIS — I255 Ischemic cardiomyopathy: Secondary | ICD-10-CM | POA: Diagnosis not present

## 2015-08-22 NOTE — Progress Notes (Signed)
LOOP RECORDER  

## 2015-08-23 NOTE — Progress Notes (Signed)
Error below --- Remote ICD transmission.   

## 2015-08-25 ENCOUNTER — Encounter: Payer: Self-pay | Admitting: Cardiology

## 2015-08-25 LAB — CUP PACEART REMOTE DEVICE CHECK
Battery Voltage: 3.12 V
Brady Statistic RV Percent Paced: 0.04 %
Date Time Interrogation Session: 20161024052208
HighPow Impedance: 361 Ohm
HighPow Impedance: 64 Ohm
Implantable Lead Implant Date: 20130111
Implantable Lead Location: 753860
Implantable Lead Model: 6935
Lead Channel Impedance Value: 418 Ohm
Lead Channel Pacing Threshold Amplitude: 0.375 V
Lead Channel Setting Pacing Amplitude: 2.5 V
Lead Channel Setting Pacing Pulse Width: 0.4 ms
Lead Channel Setting Sensing Sensitivity: 0.3 mV
MDC IDC MSMT LEADCHNL RV PACING THRESHOLD PULSEWIDTH: 0.4 ms
MDC IDC MSMT LEADCHNL RV SENSING INTR AMPL: 9.375 mV
MDC IDC MSMT LEADCHNL RV SENSING INTR AMPL: 9.375 mV

## 2015-09-20 NOTE — Progress Notes (Signed)
HPI: FU coronary disease. The patient had his last cardiac catheterization in 2003. He had PCI of his LAD and right coronary artery. His ejection fraction was 30%. Myoview in Sept of 2012 showed EF 22, anteroapical infarct; no ischemia. Patient had ICD system revision in Jan 2013. Abd ultrasound 10/14 showed not aneurysm. Since I last saw him, the patient denies any dyspnea on exertion, orthopnea, PND, pedal edema, palpitations, syncope or chest pain.   Current Outpatient Prescriptions  Medication Sig Dispense Refill  . aspirin 325 MG tablet Take 325 mg by mouth daily.      . benazepril (LOTENSIN) 10 MG tablet TAKE 1 TABLET (10 MG TOTAL) BY MOUTH DAILY. 90 tablet 2  . carvedilol (COREG) 25 MG tablet TAKE 1 TABLET (25 MG TOTAL) BY MOUTH 2 (TWO) TIMES DAILY WITH A MEAL. 180 tablet 3  . finasteride (PROSCAR) 5 MG tablet Take 5 mg by mouth daily.    Marland Kitchen LORazepam (ATIVAN) 0.5 MG tablet Take 1 tablet by mouth daily as needed for anxiety.     . simvastatin (ZOCOR) 40 MG tablet TAKE 1 TABLET (40 MG TOTAL) BY MOUTH AT BEDTIME. 90 tablet 3   No current facility-administered medications for this visit.     Past Medical History  Diagnosis Date  . Ischemic cardiomyopathy   . Renal insufficiency   . Hyperlipemia   . HTN (hypertension)   . Myocardial infarct ([place]) 1988  . LV dysfunction   . Hyperkalemia   . Ventricular tachycardia ([place])   . CHF (congestive heart failure) ([place])   . Syncope and collapse   . Heart attack ([place])   . Enlarged prostate   . Coronary artery disease     Hx of cardiac caths   . GERD (gastroesophageal reflux disease)     Takes prilosec  . Anxiety     Takes Ativan as needed  . Panic attacks   . Gout   . Personal history of peptic ulcer disease   . Cardiac dysrhythmia, unspecified     Past Surgical History  Procedure Laterality Date  . Cardiac catheterization  2003  . Coronary angioplasty    . Cardiac defibrillator placement    . Colonoscopy    .  Esophagogastroduodenoscopy      with biopsy  . Insert / replace / remove pacemaker  2005    ICD; replaced in Jan 2013  . Hernia repair  01/01/2012  . Umbilical hernia repair    . Umbilical hernia repair  01/01/2012    Procedure: HERNIA REPAIR UMBILICAL ADULT;  Surgeon: Merrie Roof, MD;  Location: [place];  Service: General;  Laterality: N/A;  umbilical hernia repair with mesh  . Lead revision N/A 11/08/2011    Procedure: LEAD REVISION;  Surgeon: Thompson Grayer, MD;  Location: [facility] CATH LAB;  Service: Cardiovascular;  Laterality: N/A;    Social History   Social History  . Marital Status: Married    Spouse Name: N/A  . Number of Children: N/A  . Years of Education: N/A   Occupational History  . Not on file.   Social History Main Topics  . Smoking status: Never Smoker   . Smokeless tobacco: Never Used  . Alcohol Use: Yes     Comment: occassionally  . Drug Use: No  . Sexual Activity: Not Currently   Other Topics Concern  . Not on file   Social History Narrative    ROS: no fevers or chills, productive cough, hemoptysis, dysphasia,  odynophagia, melena, hematochezia, dysuria, hematuria, rash, seizure activity, orthopnea, PND, pedal edema, claudication. Remaining systems are negative.  Physical Exam: Well-developed well-nourished in no acute distress.  Skin is warm and dry.  HEENT is normal.  Neck is supple.  Chest is clear to auscultation with normal expansion.  Cardiovascular exam is regular rate and rhythm.  Abdominal exam nontender or distended. No masses palpated. Extremities show no edema. neuro grossly intact  ECG Sinus rhythm with frequent PVCs. Left anterior fascicular block. Septal infarct. Anterior lateral T-wave inversion.

## 2015-09-29 ENCOUNTER — Ambulatory Visit (INDEPENDENT_AMBULATORY_CARE_PROVIDER_SITE_OTHER): Payer: Medicare Other | Admitting: Cardiology

## 2015-09-29 ENCOUNTER — Encounter: Payer: Self-pay | Admitting: Cardiology

## 2015-09-29 VITALS — BP 132/60 | HR 71 | Ht 67.0 in | Wt 178.9 lb

## 2015-09-29 DIAGNOSIS — I2510 Atherosclerotic heart disease of native coronary artery without angina pectoris: Secondary | ICD-10-CM | POA: Diagnosis not present

## 2015-09-29 DIAGNOSIS — I2583 Coronary atherosclerosis due to lipid rich plaque: Secondary | ICD-10-CM

## 2015-09-29 DIAGNOSIS — Z95810 Presence of automatic (implantable) cardiac defibrillator: Secondary | ICD-10-CM

## 2015-09-29 DIAGNOSIS — I255 Ischemic cardiomyopathy: Secondary | ICD-10-CM | POA: Diagnosis not present

## 2015-09-29 NOTE — Assessment & Plan Note (Signed)
Followed by electrophysiology. 

## 2015-09-29 NOTE — Assessment & Plan Note (Signed)
Blood pressure controlled. Continue present medications. Potassium and renal function monitored by primary care. 

## 2015-09-29 NOTE — Patient Instructions (Signed)
Your physician wants you to follow-up in: ONE YEAR WITH DR CRENSHAW You will receive a reminder letter in the mail two months in advance. If you don't receive a letter, please call our office to schedule the follow-up appointment.   If you need a refill on your cardiac medications before your next appointment, please call your pharmacy.  

## 2015-09-29 NOTE — Assessment & Plan Note (Signed)
Continue statin. Lipids and liver monitored by primary care. 

## 2015-09-29 NOTE — Assessment & Plan Note (Signed)
Continue ACE inhibitor and beta blocker. 

## 2015-09-29 NOTE — Assessment & Plan Note (Signed)
Continue aspirin and statin. 

## 2015-11-20 ENCOUNTER — Ambulatory Visit (INDEPENDENT_AMBULATORY_CARE_PROVIDER_SITE_OTHER): Payer: Medicare Other | Admitting: *Deleted

## 2015-11-20 DIAGNOSIS — I255 Ischemic cardiomyopathy: Secondary | ICD-10-CM | POA: Diagnosis not present

## 2015-11-20 DIAGNOSIS — I5022 Chronic systolic (congestive) heart failure: Secondary | ICD-10-CM

## 2015-11-21 NOTE — Progress Notes (Signed)
Remote ICD transmission.   

## 2015-11-22 LAB — CUP PACEART REMOTE DEVICE CHECK
Battery Voltage: 3.09 V
Brady Statistic RV Percent Paced: 0.11 %
Date Time Interrogation Session: 20170123094228
HighPow Impedance: 342 Ohm
HighPow Impedance: 61 Ohm
Implantable Lead Implant Date: 20130111
Implantable Lead Location: 753860
Implantable Lead Model: 6935
Lead Channel Pacing Threshold Amplitude: 0.5 V
Lead Channel Pacing Threshold Pulse Width: 0.4 ms
Lead Channel Sensing Intrinsic Amplitude: 8.375 mV
Lead Channel Setting Pacing Amplitude: 2.5 V
Lead Channel Setting Sensing Sensitivity: 0.3 mV
MDC IDC MSMT LEADCHNL RV IMPEDANCE VALUE: 418 Ohm
MDC IDC MSMT LEADCHNL RV SENSING INTR AMPL: 8.375 mV
MDC IDC SET LEADCHNL RV PACING PULSEWIDTH: 0.4 ms

## 2015-11-29 ENCOUNTER — Encounter: Payer: Self-pay | Admitting: Cardiology

## 2015-12-13 ENCOUNTER — Encounter: Payer: Self-pay | Admitting: Cardiology

## 2016-02-20 ENCOUNTER — Telehealth: Payer: Self-pay | Admitting: *Deleted

## 2016-02-20 NOTE — Telephone Encounter (Signed)
Spoke with Adrian Pace, Aware of dr Jacalyn Lefevre recommendations.  This note will be faxed to Dr Isidoro Donning @ [phone]

## 2016-02-20 NOTE — Telephone Encounter (Signed)
Pt walked into the office wioth a form requesting clearance for him to have bilateral epiphora. He is also needing the okay to hold his aspirin for the procedure. Will forward for dr Stanford Breed review

## 2016-02-20 NOTE — Telephone Encounter (Signed)
[place] for surgery and to hold asa Kirk Ruths

## 2016-03-19 ENCOUNTER — Encounter: Payer: Self-pay | Admitting: Internal Medicine

## 2016-03-19 ENCOUNTER — Ambulatory Visit (INDEPENDENT_AMBULATORY_CARE_PROVIDER_SITE_OTHER): Payer: Medicare Other | Admitting: Internal Medicine

## 2016-03-19 VITALS — BP 120/52 | HR 84 | Ht 68.0 in | Wt 181.8 lb

## 2016-03-19 DIAGNOSIS — I472 Ventricular tachycardia, unspecified: Secondary | ICD-10-CM

## 2016-03-19 DIAGNOSIS — I5022 Chronic systolic (congestive) heart failure: Secondary | ICD-10-CM

## 2016-03-19 DIAGNOSIS — Z95810 Presence of automatic (implantable) cardiac defibrillator: Secondary | ICD-10-CM

## 2016-03-19 LAB — CUP PACEART INCLINIC DEVICE CHECK
Battery Voltage: 3.11 V
Date Time Interrogation Session: 20170523095025
HIGH POWER IMPEDANCE MEASURED VALUE: 67 Ohm
HighPow Impedance: 342 Ohm
Implantable Lead Location: 753860
Implantable Lead Model: 6935
Lead Channel Impedance Value: 399 Ohm
Lead Channel Setting Pacing Amplitude: 2.5 V
Lead Channel Setting Sensing Sensitivity: 0.3 mV
MDC IDC LEAD IMPLANT DT: 20130111
MDC IDC MSMT LEADCHNL RV PACING THRESHOLD AMPLITUDE: 0.5 V
MDC IDC MSMT LEADCHNL RV PACING THRESHOLD PULSEWIDTH: 0.4 ms
MDC IDC MSMT LEADCHNL RV SENSING INTR AMPL: 9.75 mV
MDC IDC SET LEADCHNL RV PACING PULSEWIDTH: 0.4 ms
MDC IDC STAT BRADY RV PERCENT PACED: 0.1 %

## 2016-03-19 NOTE — Progress Notes (Signed)
HPI Mr. Skahan returns today for followup. He is a very pleasant 82 year old man with an ischemic cardiomyopathy, chronic systolic heart failure, ventricular tachycardia, and hypertension. In the interim, he has done well although he admits to dietary indiscretion.  He denies chest pain, shortness of breath, or syncope. No peripheral edema. He has had no ICD shock.  Allergies  Allergen Reactions  . Codeine Anxiety     Current Outpatient Prescriptions  Medication Sig Dispense Refill  . aspirin 325 MG tablet Take 325 mg by mouth daily.      . benazepril (LOTENSIN) 10 MG tablet TAKE 1 TABLET (10 MG TOTAL) BY MOUTH DAILY. 90 tablet 2  . carvedilol (COREG) 25 MG tablet TAKE 1 TABLET (25 MG TOTAL) BY MOUTH 2 (TWO) TIMES DAILY WITH A MEAL. 180 tablet 3  . finasteride (PROSCAR) 5 MG tablet Take 5 mg by mouth daily.    Marland Kitchen LORazepam (ATIVAN) 0.5 MG tablet Take 1 tablet by mouth daily as needed for anxiety.     . simvastatin (ZOCOR) 40 MG tablet TAKE 1 TABLET (40 MG TOTAL) BY MOUTH AT BEDTIME. 90 tablet 3   No current facility-administered medications for this visit.     Past Medical History  Diagnosis Date  . Ischemic cardiomyopathy   . Renal insufficiency   . Hyperlipemia   . HTN (hypertension)   . Myocardial infarct ([place]) 1988  . LV dysfunction   . Hyperkalemia   . Ventricular tachycardia ([place])   . CHF (congestive heart failure) ([place])   . Syncope and collapse   . Heart attack ([place])   . Enlarged prostate   . Coronary artery disease     Hx of cardiac caths   . GERD (gastroesophageal reflux disease)     Takes prilosec  . Anxiety     Takes Ativan as needed  . Panic attacks   . Gout   . Personal history of peptic ulcer disease   . Cardiac dysrhythmia, unspecified     ROS:   All systems reviewed and negative except as noted in the HPI.   Past Surgical History  Procedure Laterality Date  . Cardiac catheterization  2003  . Coronary angioplasty    . Cardiac defibrillator  placement    . Colonoscopy    . Esophagogastroduodenoscopy      with biopsy  . Insert / replace / remove pacemaker  2005    ICD; replaced in Jan 2013  . Hernia repair  01/01/2012  . Umbilical hernia repair    . Umbilical hernia repair  01/01/2012    Procedure: HERNIA REPAIR UMBILICAL ADULT;  Surgeon: Merrie Roof, MD;  Location: [place];  Service: General;  Laterality: N/A;  umbilical hernia repair with mesh  . Lead revision N/A 11/08/2011    Procedure: LEAD REVISION;  Surgeon: Thompson Grayer, MD;  Location: [facility] CATH LAB;  Service: Cardiovascular;  Laterality: N/A;     Family History  Problem Relation Age of Onset  . Heart attack Father     x2  . Heart disease Father   . Cancer Father     bladder and prostate  . Anesthesia problems Neg Hx   . Hypotension Neg Hx   . Malignant hyperthermia Neg Hx   . Pseudochol deficiency Neg Hx      Social History   Social History  . Marital Status: Married    Spouse Name: N/A  . Number of Children: N/A  . Years of Education: N/A   Occupational History  .  Not on file.   Social History Main Topics  . Smoking status: Never Smoker   . Smokeless tobacco: Never Used  . Alcohol Use: Yes     Comment: occassionally  . Drug Use: No  . Sexual Activity: Not Currently   Other Topics Concern  . Not on file   Social History Narrative     BP 120/52 mmHg  Pulse 84  Ht 5\' 8"  (1.727 m)  Wt 181 lb 12.8 oz (82.464 kg)  BMI 27.65 kg/m2  Physical Exam:  Well appearing 82 year old man,NAD HEENT: Unremarkable Neck:  7 cm JVD, no thyromegally Lungs:  Clear with no wheezes, rales, or rhonchi. HEART:  Regular rate rhythm, no murmurs, no rubs, no clicks Abd:  soft, positive bowel sounds, no organomegally, no rebound, no guarding Ext:  2 plus pulses, no edema, no cyanosis, no clubbing Skin:  No rashes no nodules Neuro:  CN II through XII intact, motor grossly intact  DEVICE  Normal device function.  See PaceArt for details.   Assess/Plan: 1.  VT - he has had no recurrent VT. 2. Chronic systolic heart failure - despite dietary indiscretion, he has maintained euvolemia. I have asked him to reduce the salt in his diet. 3. ICD - his Med[employer] device is working normally. Will follow. 4. HTN - his blood pressure is well controlled. Will follow.  Mikle Bosworth.D.

## 2016-03-19 NOTE — Patient Instructions (Signed)
Medication Instructions:  Your physician recommends that you continue on your current medications as directed. Please refer to the Current Medication list given to you today.   Labwork: None ordered   Testing/Procedures: None ordered   Follow-Up: Your physician wants you to follow-up in: 12 months with Dr Knox Saliva will receive a reminder letter in the mail two months in advance. If you don't receive a letter, please call our office to schedule the follow-up appointment.  Remote monitoring is used to monitor your ICD from home. This monitoring reduces the number of office visits required to check your device to one time per year. It allows [place] to keep an eye on the functioning of your device to ensure it is working properly. You are scheduled for a device check from home on 06/17/16. You may send your transmission at any time that day. If you have a wireless device, the transmission will be sent automatically. After your physician reviews your transmission, you will receive a postcard with your next transmission date.     Any Other Special Instructions Will Be Listed Below (If Applicable).     If you need a refill on your cardiac medications before your next appointment, please call your pharmacy.

## 2016-05-13 ENCOUNTER — Other Ambulatory Visit: Payer: Self-pay

## 2016-05-14 MED ORDER — BENAZEPRIL HCL 10 MG PO TABS: ORAL_TABLET | ORAL | Status: DC

## 2016-05-15 ENCOUNTER — Other Ambulatory Visit: Payer: Self-pay

## 2016-06-16 ENCOUNTER — Other Ambulatory Visit: Payer: Self-pay | Admitting: Internal Medicine

## 2016-06-17 NOTE — Telephone Encounter (Signed)
Rx request sent to pharmacy.  

## 2016-06-18 ENCOUNTER — Ambulatory Visit (INDEPENDENT_AMBULATORY_CARE_PROVIDER_SITE_OTHER): Payer: Medicare Other | Admitting: *Deleted

## 2016-06-18 DIAGNOSIS — I255 Ischemic cardiomyopathy: Secondary | ICD-10-CM

## 2016-06-18 DIAGNOSIS — Z95810 Presence of automatic (implantable) cardiac defibrillator: Secondary | ICD-10-CM

## 2016-06-18 NOTE — Progress Notes (Signed)
Remote ICD transmission.   

## 2016-06-21 ENCOUNTER — Encounter: Payer: Self-pay | Admitting: Cardiology

## 2016-06-27 LAB — CUP PACEART REMOTE DEVICE CHECK
Brady Statistic RV Percent Paced: 0.11 %
HIGH POWER IMPEDANCE MEASURED VALUE: 64 Ohm
HighPow Impedance: 342 Ohm
Implantable Lead Implant Date: 20130111
Implantable Lead Location: 753860
Lead Channel Impedance Value: 399 Ohm
Lead Channel Pacing Threshold Amplitude: 0.5 V
Lead Channel Pacing Threshold Pulse Width: 0.4 ms
Lead Channel Sensing Intrinsic Amplitude: 9 mV
Lead Channel Setting Pacing Amplitude: 2.5 V
MDC IDC LEAD MODEL: 6935
MDC IDC MSMT BATTERY VOLTAGE: 3.08 V
MDC IDC MSMT LEADCHNL RV SENSING INTR AMPL: 9 mV
MDC IDC SESS DTM: 20170822073628
MDC IDC SET LEADCHNL RV PACING PULSEWIDTH: 0.4 ms
MDC IDC SET LEADCHNL RV SENSING SENSITIVITY: 0.3 mV

## 2016-07-30 ENCOUNTER — Other Ambulatory Visit: Payer: Self-pay | Admitting: *Deleted

## 2016-07-30 ENCOUNTER — Telehealth: Payer: Self-pay | Admitting: Internal Medicine

## 2016-07-30 MED ORDER — CARVEDILOL 25 MG PO TABS: ORAL_TABLET | ORAL | 1 refills | Status: DC

## 2016-07-30 NOTE — Telephone Encounter (Signed)
New message       *STAT* If patient is at the pharmacy, call can be transferred to refill team.   1. Which medications need to be refilled? (please list name of each medication and dose if known)  Carvedilol 25mg  2. Which pharmacy/location (including street and city if local pharmacy) is medication to be sent to? [facility] [address]. Do they need a 30 day or 90 day supply? 90 day

## 2016-08-19 ENCOUNTER — Emergency Department (HOSPITAL_COMMUNITY)
Admission: EM | Admit: 2016-08-19 | Discharge: 2016-08-19 | Disposition: A | Discharge status: Home / Self Care | Payer: Medicare Other | Attending: Emergency Medicine | Admitting: Emergency Medicine

## 2016-08-19 ENCOUNTER — Encounter (HOSPITAL_COMMUNITY): Payer: Self-pay | Admitting: Emergency Medicine

## 2016-08-19 DIAGNOSIS — Z79899 Other long term (current) drug therapy: Secondary | ICD-10-CM | POA: Insufficient documentation

## 2016-08-19 DIAGNOSIS — Z7982 Long term (current) use of aspirin: Secondary | ICD-10-CM | POA: Insufficient documentation

## 2016-08-19 DIAGNOSIS — I509 Heart failure, unspecified: Secondary | ICD-10-CM | POA: Insufficient documentation

## 2016-08-19 DIAGNOSIS — K5900 Constipation, unspecified: Secondary | ICD-10-CM | POA: Insufficient documentation

## 2016-08-19 DIAGNOSIS — Z95810 Presence of automatic (implantable) cardiac defibrillator: Secondary | ICD-10-CM | POA: Insufficient documentation

## 2016-08-19 DIAGNOSIS — I110 Hypertensive heart disease with heart failure: Secondary | ICD-10-CM | POA: Insufficient documentation

## 2016-08-19 DIAGNOSIS — I2510 Atherosclerotic heart disease of native coronary artery without angina pectoris: Secondary | ICD-10-CM | POA: Insufficient documentation

## 2016-08-19 DIAGNOSIS — I252 Old myocardial infarction: Secondary | ICD-10-CM | POA: Diagnosis not present

## 2016-08-19 MED ORDER — POLYETHYLENE GLYCOL 3350 17 G PO PACK: 17.0000 g | PACK | Freq: Every day | ORAL | 0 refills | Status: DC

## 2016-08-19 NOTE — Discharge Instructions (Signed)
Please read attached information. If you experience any new or worsening signs or symptoms please return to the emergency room for evaluation. Please follow-up with your primary care provider or specialist as discussed. Please use medication prescribed only as directed and discontinue taking if you have any concerning signs or symptoms.   °

## 2016-08-19 NOTE — ED Triage Notes (Signed)
Patient states that he was having watery stools after eating last week, so took Immodium which stopped BMs.  Patient now hasnt had a BM since Thursday. Patient states that having feeling of needing to have BM and been straining.

## 2016-08-19 NOTE — ED Provider Notes (Signed)
[facility] DEPT Provider Note   CSN: [id number] Arrival date & time: 08/19/16  1259    History   Chief Complaint Chief Complaint  Patient presents with  . Constipation    HPI Adrian Pace is a 82 y.o. male.  HPI    82 year old male presents today with constipation. Patient reports approximately 2 weeks ago he had diarrhea. He notes taking Imodium for several days which stopped the diarrhea. He notes after taking Imodium when he started to become constipated. Patient notes that his last normal bowel movement was 5 days ago. He notes this morning he attempted having a bowel movement could feel the hard impacted stool but unable to pass it. He denies any blood, abdominal pain, nausea or vomiting.    Past Medical History:  Diagnosis Date  . Anxiety    Takes Ativan as needed  . Cardiac dysrhythmia, unspecified   . CHF (congestive heart failure) ([place])   . Coronary artery disease    Hx of cardiac caths   . Enlarged prostate   . GERD (gastroesophageal reflux disease)    Takes prilosec  . Gout   . Heart attack   . HTN (hypertension)   . Hyperkalemia   . Hyperlipemia   . Ischemic cardiomyopathy   . LV dysfunction   . Myocardial infarct 1988  . Panic attacks   . Personal history of peptic ulcer disease   . Renal insufficiency   . Syncope and collapse   . Ventricular tachycardia [facility])     Patient Active Problem List   Diagnosis Date Noted  . Bruit 07/19/2013  . Automatic implantable cardioverter-defibrillator in situ 11/08/2011  . Umbilical hernia [id number]  . CAD (coronary artery disease) 07/09/2011  . Ischemic cardiomyopathy   . Renal insufficiency   . Hyperlipemia   . HTN (hypertension)   . LV dysfunction   . Anxiety   . Panic attacks   . Hyperkalemia   . DYSLIPIDEMIA 04/11/2009  . Essential hypertension 04/11/2009  . VENTRICULAR TACHYCARDIA 04/11/2009  . CHF 04/11/2009  . SYNCOPE 04/11/2009    Past Surgical History:  Procedure Laterality Date    . CARDIAC CATHETERIZATION  2003  . CARDIAC DEFIBRILLATOR PLACEMENT    . COLONOSCOPY    . CORONARY ANGIOPLASTY    . ESOPHAGOGASTRODUODENOSCOPY     with biopsy  . HERNIA REPAIR  01/01/2012  . INSERT / REPLACE / REMOVE PACEMAKER  2005   ICD; replaced in Jan 2013  . LEAD REVISION N/A 11/08/2011   Procedure: LEAD REVISION;  Surgeon: Thompson Grayer, MD;  Location: [facility] CATH LAB;  Service: Cardiovascular;  Laterality: N/A;  . UMBILICAL HERNIA REPAIR    . UMBILICAL HERNIA REPAIR  01/01/2012   Procedure: HERNIA REPAIR UMBILICAL ADULT;  Surgeon: Merrie Roof, MD;  Location: [place];  Service: General;  Laterality: N/A;  umbilical hernia repair with mesh    OB History    No data available       Home Medications    Prior to Admission medications   Medication Sig Start Date End Date Taking? Authorizing Provider  aspirin 325 MG tablet Take 325 mg by mouth daily.     Yes Historical Provider, MD  benazepril (LOTENSIN) 10 MG tablet TAKE 1 TABLET (10 MG TOTAL) BY MOUTH DAILY. 05/14/16  Yes Lelon Perla, MD  carvedilol (COREG) 25 MG tablet TAKE 1 TABLET (25 MG TOTAL) BY MOUTH 2 (TWO) TIMES DAILY WITH A MEAL. 07/30/16  Yes Evans Lance, MD  finasteride (  PROSCAR) 5 MG tablet Take 5 mg by mouth daily.   Yes Historical Provider, MD  LORazepam (ATIVAN) 0.5 MG tablet Take 1 tablet by mouth daily as needed for anxiety.  12/08/13  Yes Historical Provider, MD  simvastatin (ZOCOR) 40 MG tablet Take 1 tablet (40 mg total) by mouth daily at 6 PM. 06/17/16  Yes Lelon Perla, MD  polyethylene glycol [facility]) packet Take 17 g by mouth daily. 08/19/16   Okey Regal, PA-C    Family History Family History  Problem Relation Age of Onset  . Heart attack Father     x2  . Heart disease Father   . Cancer Father     bladder and prostate  . Anesthesia problems Neg Hx   . Hypotension Neg Hx   . Malignant hyperthermia Neg Hx   . Pseudochol deficiency Neg Hx     Social History Social History  Substance Use  Topics  . Smoking status: Never Smoker  . Smokeless tobacco: Never Used  . Alcohol use Yes     Comment: occassionally     Allergies   Codeine   Review of Systems Review of Systems  All other systems reviewed and are negative.  Physical Exam Updated Vital Signs BP 154/84 (BP Location: Right Arm)   Pulse 83   Temp 98.1 F (36.7 C) (Oral)   Resp 19   SpO2 98%   Physical Exam  Constitutional: He is oriented to person, place, and time. He appears well-developed and well-nourished.  HENT:  Head: Normocephalic and atraumatic.  Eyes: Conjunctivae are normal. Pupils are equal, round, and reactive to light. Right eye exhibits no discharge. Left eye exhibits no discharge. No scleral icterus.  Neck: Normal range of motion. No JVD present. No tracheal deviation present.  Pulmonary/Chest: Effort normal. No stridor.  Abdominal: Soft. He exhibits no distension and no mass. There is no tenderness. There is no rebound and no guarding. No hernia.  Genitourinary:  Genitourinary Comments: Impacted stool in rectum - internal and external hemroids noted   Neurological: He is alert and oriented to person, place, and time. Coordination normal.  Psychiatric: He has a normal mood and affect. His behavior is normal. Judgment and thought content normal.  Nursing note and vitals reviewed.    ED Treatments / Results  Labs (all labs ordered are listed, but only abnormal results are displayed) Labs Reviewed - No data to display  EKG  EKG Interpretation None       Radiology No results found.  Procedures Procedures (including critical care time)  Medications Ordered in ED Medications - No data to display   Initial Impression / Assessment and Plan / ED Course  I have reviewed the triage vital signs and the nursing notes.  Pertinent labs & imaging results that were available during my care of the patient were reviewed by me and considered in my medical decision making (see chart for  details).  Clinical Course    Labs:  Imaging:  Consults:  Therapeutics:  Discharge Meds:   Assessment/Plan:   30 YOM presents today with Constipation. I manually disimpacted the patient, he felt significant relief, was unable to have a bowel movement here, was offered an enema but refused. Patient reports that he would like to try a trial of MiraLAX at home at this point. Patient is nontoxic, no abdominal pain no nausea or vomiting. He'll be discharged home with MiraLAX and close follow-up with primary care. Patient verbalized understanding and agreement to today's plan had  no further questions or concerns.    Final Clinical Impressions(s) / ED Diagnoses   Final diagnoses:  Constipation, unspecified constipation type    New Prescriptions New Prescriptions   POLYETHYLENE GLYCOL (MIRALAX) PACKET    Take 17 g by mouth daily.     Okey Regal, PA-C 08/19/16 [place], MD 08/19/16 [phone]

## 2016-09-17 ENCOUNTER — Ambulatory Visit (INDEPENDENT_AMBULATORY_CARE_PROVIDER_SITE_OTHER): Payer: Medicare Other | Admitting: *Deleted

## 2016-09-17 DIAGNOSIS — I255 Ischemic cardiomyopathy: Secondary | ICD-10-CM

## 2016-09-17 NOTE — Progress Notes (Signed)
Remote ICD transmission.   

## 2016-09-26 ENCOUNTER — Encounter: Payer: Self-pay | Admitting: Cardiology

## 2016-10-01 NOTE — Progress Notes (Signed)
HPI: FU coronary disease. The patient had his last cardiac catheterization in 2003. He had PCI of his LAD and right coronary artery. His ejection fraction was 30%. Myoview in Sept of 2012 showed EF 22, anteroapical infarct; no ischemia. Patient had ICD system revision in Jan 2013. Abd ultrasound 10/14 showed no aneurysm. Since I last saw him, the patient denies any dyspnea on exertion, orthopnea, PND, pedal edema, palpitations, syncope or chest pain.   Current Outpatient Prescriptions  Medication Sig Dispense Refill  . aspirin 325 MG tablet Take 325 mg by mouth daily.      . benazepril (LOTENSIN) 10 MG tablet TAKE 1 TABLET (10 MG TOTAL) BY MOUTH DAILY. 90 tablet 2  . carvedilol (COREG) 25 MG tablet TAKE 1 TABLET (25 MG TOTAL) BY MOUTH 2 (TWO) TIMES DAILY WITH A MEAL. 180 tablet 1  . finasteride (PROSCAR) 5 MG tablet Take 5 mg by mouth daily.    Marland Kitchen LORazepam (ATIVAN) 0.5 MG tablet Take 1 tablet by mouth daily as needed for anxiety.     . simvastatin (ZOCOR) 40 MG tablet Take 1 tablet (40 mg total) by mouth daily at 6 PM. 90 tablet 1   No current facility-administered medications for this visit.      Past Medical History:  Diagnosis Date  . Anxiety    Takes Ativan as needed  . Cardiac dysrhythmia, unspecified   . CHF (congestive heart failure) ([place])   . Coronary artery disease    Hx of cardiac caths   . Enlarged prostate   . GERD (gastroesophageal reflux disease)    Takes prilosec  . Gout   . Heart attack   . HTN (hypertension)   . Hyperkalemia   . Hyperlipemia   . Ischemic cardiomyopathy   . LV dysfunction   . Myocardial infarct 1988  . Panic attacks   . Personal history of peptic ulcer disease   . Renal insufficiency   . Syncope and collapse   . Ventricular tachycardia [facility])     Past Surgical History:  Procedure Laterality Date  . CARDIAC CATHETERIZATION  2003  . CARDIAC DEFIBRILLATOR PLACEMENT    . COLONOSCOPY    . CORONARY ANGIOPLASTY    .  ESOPHAGOGASTRODUODENOSCOPY     with biopsy  . HERNIA REPAIR  01/01/2012  . INSERT / REPLACE / REMOVE PACEMAKER  2005   ICD; replaced in Jan 2013  . LEAD REVISION N/A 11/08/2011   Procedure: LEAD REVISION;  Surgeon: Thompson Grayer, MD;  Location: [facility] CATH LAB;  Service: Cardiovascular;  Laterality: N/A;  . UMBILICAL HERNIA REPAIR    . UMBILICAL HERNIA REPAIR  01/01/2012   Procedure: HERNIA REPAIR UMBILICAL ADULT;  Surgeon: Merrie Roof, MD;  Location: [place];  Service: General;  Laterality: N/A;  umbilical hernia repair with mesh    Social History   Social History  . Marital status: Married    Spouse name: N/A  . Number of children: N/A  . Years of education: N/A   Occupational History  . Not on file.   Social History Main Topics  . Smoking status: Never Smoker  . Smokeless tobacco: Never Used  . Alcohol use Yes     Comment: occassionally  . Drug use: No  . Sexual activity: Not Currently   Other Topics Concern  . Not on file   Social History Narrative  . No narrative on file    Family History  Problem Relation Age of Onset  .  Heart attack Father     x2  . Heart disease Father   . Cancer Father     bladder and prostate  . Anesthesia problems Neg Hx   . Hypotension Neg Hx   . Malignant hyperthermia Neg Hx   . Pseudochol deficiency Neg Hx     ROS: no fevers or chills, productive cough, hemoptysis, dysphasia, odynophagia, melena, hematochezia, dysuria, hematuria, rash, seizure activity, orthopnea, PND, pedal edema, claudication. Remaining systems are negative.  Physical Exam: Well-developed well-nourished in no acute distress.  Skin is warm and dry.  HEENT is normal.  Neck is supple.  Chest is clear to auscultation with normal expansion.  Cardiovascular exam is regular rate and rhythm.  Abdominal exam nontender or distended. No masses palpated. Extremities show no edema. neuro grossly intact  ECG-Sinus rhythm with occasional PVC. Left anterior fascicular block.  Prior anterior infarct. Lateral T-wave inversion.  A/P  1 prior ICD-followed by electrophysiology.  2 cornea artery disease-continue aspirin and statin.  3 hyperlipidemia-continue statin.  4 hypertension-blood pressure borderline but he has not taken his medications this morning. He will follow at home and we will advance as needed.  5 ischemic cardiomyopathy-continue ACE inhibitor and beta blocker.  Kirk Ruths, MD

## 2016-10-02 LAB — CUP PACEART REMOTE DEVICE CHECK
Brady Statistic RV Percent Paced: 0.17 %
Date Time Interrogation Session: 20171121083427
HIGH POWER IMPEDANCE MEASURED VALUE: 342 Ohm
HIGH POWER IMPEDANCE MEASURED VALUE: 64 Ohm
Implantable Lead Location: 753860
Implantable Pulse Generator Implant Date: 20130111
Lead Channel Pacing Threshold Pulse Width: 0.4 ms
Lead Channel Sensing Intrinsic Amplitude: 8 mV
Lead Channel Setting Pacing Amplitude: 2.5 V
Lead Channel Setting Sensing Sensitivity: 0.3 mV
MDC IDC LEAD IMPLANT DT: 20130111
MDC IDC LEAD MODEL: 6935
MDC IDC MSMT BATTERY VOLTAGE: 3.09 V
MDC IDC MSMT LEADCHNL RV IMPEDANCE VALUE: 418 Ohm
MDC IDC MSMT LEADCHNL RV PACING THRESHOLD AMPLITUDE: 0.5 V
MDC IDC MSMT LEADCHNL RV SENSING INTR AMPL: 8 mV
MDC IDC SET LEADCHNL RV PACING PULSEWIDTH: 0.4 ms

## 2016-10-04 ENCOUNTER — Ambulatory Visit (INDEPENDENT_AMBULATORY_CARE_PROVIDER_SITE_OTHER): Payer: Medicare Other | Admitting: Cardiology

## 2016-10-04 ENCOUNTER — Encounter: Payer: Self-pay | Admitting: Cardiology

## 2016-10-04 VITALS — BP 138/80 | HR 73 | Ht 68.0 in | Wt 173.0 lb

## 2016-10-04 DIAGNOSIS — I2510 Atherosclerotic heart disease of native coronary artery without angina pectoris: Secondary | ICD-10-CM | POA: Diagnosis not present

## 2016-10-04 DIAGNOSIS — I255 Ischemic cardiomyopathy: Secondary | ICD-10-CM

## 2016-10-04 DIAGNOSIS — E7800 Pure hypercholesterolemia, unspecified: Secondary | ICD-10-CM | POA: Diagnosis not present

## 2016-10-04 DIAGNOSIS — I10 Essential (primary) hypertension: Secondary | ICD-10-CM | POA: Diagnosis not present

## 2016-10-04 DIAGNOSIS — Z95810 Presence of automatic (implantable) cardiac defibrillator: Secondary | ICD-10-CM

## 2016-10-04 NOTE — Patient Instructions (Signed)
Your physician wants you to follow-up in: ONE YEAR WITH DR CRENSHAW You will receive a reminder letter in the mail two months in advance. If you don't receive a letter, please call our office to schedule the follow-up appointment.   If you need a refill on your cardiac medications before your next appointment, please call your pharmacy.  

## 2016-12-17 ENCOUNTER — Ambulatory Visit (INDEPENDENT_AMBULATORY_CARE_PROVIDER_SITE_OTHER): Payer: Medicare Other | Admitting: *Deleted

## 2016-12-17 DIAGNOSIS — I255 Ischemic cardiomyopathy: Secondary | ICD-10-CM

## 2016-12-17 NOTE — Progress Notes (Signed)
Remote ICD transmission.   

## 2016-12-18 ENCOUNTER — Encounter: Payer: Self-pay | Admitting: Cardiology

## 2016-12-18 LAB — CUP PACEART REMOTE DEVICE CHECK
Brady Statistic RV Percent Paced: 0.06 %
Date Time Interrogation Session: 20180220083428
HIGH POWER IMPEDANCE MEASURED VALUE: 342 Ohm
HIGH POWER IMPEDANCE MEASURED VALUE: 68 Ohm
Lead Channel Impedance Value: 399 Ohm
Lead Channel Pacing Threshold Amplitude: 0.5 V
Lead Channel Sensing Intrinsic Amplitude: 7.625 mV
Lead Channel Sensing Intrinsic Amplitude: 7.625 mV
Lead Channel Setting Pacing Amplitude: 2.5 V
MDC IDC LEAD IMPLANT DT: 20130111
MDC IDC LEAD LOCATION: 753860
MDC IDC MSMT BATTERY VOLTAGE: 3.07 V
MDC IDC MSMT LEADCHNL RV PACING THRESHOLD PULSEWIDTH: 0.4 ms
MDC IDC PG IMPLANT DT: 20130111
MDC IDC SET LEADCHNL RV PACING PULSEWIDTH: 0.4 ms
MDC IDC SET LEADCHNL RV SENSING SENSITIVITY: 0.3 mV

## 2016-12-23 ENCOUNTER — Other Ambulatory Visit: Payer: Self-pay | Admitting: Cardiology

## 2017-01-28 ENCOUNTER — Other Ambulatory Visit: Payer: Self-pay | Admitting: Internal Medicine

## 2017-02-06 ENCOUNTER — Other Ambulatory Visit: Payer: Self-pay | Admitting: Cardiology

## 2017-02-06 NOTE — Telephone Encounter (Signed)
REFILL 

## 2017-03-04 ENCOUNTER — Ambulatory Visit (INDEPENDENT_AMBULATORY_CARE_PROVIDER_SITE_OTHER): Payer: Medicare Other | Admitting: Internal Medicine

## 2017-03-04 ENCOUNTER — Encounter: Payer: Self-pay | Admitting: Internal Medicine

## 2017-03-04 VITALS — BP 128/70 | HR 69 | Ht 67.0 in | Wt 178.6 lb

## 2017-03-04 DIAGNOSIS — I255 Ischemic cardiomyopathy: Secondary | ICD-10-CM

## 2017-03-04 DIAGNOSIS — I5022 Chronic systolic (congestive) heart failure: Secondary | ICD-10-CM | POA: Diagnosis not present

## 2017-03-04 DIAGNOSIS — I472 Ventricular tachycardia, unspecified: Secondary | ICD-10-CM

## 2017-03-04 NOTE — Patient Instructions (Addendum)
Medication Instructions:  Your physician recommends that you continue on your current medications as directed. Please refer to the Current Medication list given to you today.   Labwork: None ordered  Testing/Procedures: None ordered  Follow-Up: Remote monitoring is used to monitor your ICD from home. This monitoring reduces the number of office visits required to check your device to one time per year. It allows [place] to keep an eye on the functioning of your device to ensure it is working properly. You are scheduled for a device check from home on 06/03/17. You may send your transmission at any time that day. If you have a wireless device, the transmission will be sent automatically. After your physician reviews your transmission, you will receive a postcard with your next transmission date.    Your physician wants you to follow-up in: 1 year with Dr. Lovena Le. You will receive a reminder letter in the mail two months in advance. If you don't receive a letter, please call our office to schedule the follow-up appointment.   Any Other Special Instructions Will Be Listed Below (If Applicable).     If you need a refill on your cardiac medications before your next appointment, please call your pharmacy.

## 2017-03-04 NOTE — Progress Notes (Signed)
HPI Adrian Pace returns today for followup. He is a very pleasant 83 year old man with an ischemic cardiomyopathy, chronic systolic heart failure, ventricular tachycardia, and hypertension. In the interim, he has done well although he admits to dietary indiscretion.  He denies chest pain, shortness of breath, or syncope. No peripheral edema. He has had no ICD shock.  Allergies  Allergen Reactions  . Codeine Anxiety     Current Outpatient Prescriptions  Medication Sig Dispense Refill  . aspirin 325 MG tablet Take 325 mg by mouth daily.      . benazepril (LOTENSIN) 10 MG tablet Take 1 tablet (10 mg total) by mouth daily. 90 tablet 1  . carvedilol (COREG) 25 MG tablet TAKE ONE TABLET BY MOUTH TWICE A DAY WITH A MEAL 180 tablet 0  . finasteride (PROSCAR) 5 MG tablet Take 5 mg by mouth daily.    Marland Kitchen LORazepam (ATIVAN) 0.5 MG tablet Take 1 tablet by mouth daily as needed for anxiety.     . simvastatin (ZOCOR) 40 MG tablet TAKE ONE TABLET BY MOUTH DAILY AT 6PM 90 tablet 3   No current facility-administered medications for this visit.      Past Medical History:  Diagnosis Date  . Anxiety    Takes Ativan as needed  . Cardiac dysrhythmia, unspecified   . CHF (congestive heart failure) ([place])   . Coronary artery disease    Hx of cardiac caths   . Enlarged prostate   . GERD (gastroesophageal reflux disease)    Takes prilosec  . Gout   . Heart attack ([place])   . HTN (hypertension)   . Hyperkalemia   . Hyperlipemia   . Ischemic cardiomyopathy   . LV dysfunction   . Myocardial infarct ([place]) 1988  . Panic attacks   . Personal history of peptic ulcer disease   . Renal insufficiency   . Syncope and collapse   . Ventricular tachycardia ([place])     ROS:   All systems reviewed and negative except as noted in the HPI.   Past Surgical History:  Procedure Laterality Date  . CARDIAC CATHETERIZATION  2003  . CARDIAC DEFIBRILLATOR PLACEMENT    . COLONOSCOPY    . CORONARY ANGIOPLASTY    .  ESOPHAGOGASTRODUODENOSCOPY     with biopsy  . HERNIA REPAIR  01/01/2012  . INSERT / REPLACE / REMOVE PACEMAKER  2005   ICD; replaced in Jan 2013  . LEAD REVISION N/A 11/08/2011   Procedure: LEAD REVISION;  Surgeon: Thompson Grayer, MD;  Location: [facility] CATH LAB;  Service: Cardiovascular;  Laterality: N/A;  . UMBILICAL HERNIA REPAIR    . UMBILICAL HERNIA REPAIR  01/01/2012   Procedure: HERNIA REPAIR UMBILICAL ADULT;  Surgeon: Merrie Roof, MD;  Location: [place];  Service: General;  Laterality: N/A;  umbilical hernia repair with mesh     Family History  Problem Relation Age of Onset  . Heart attack Father     x2  . Heart disease Father   . Cancer Father     bladder and prostate  . Anesthesia problems Neg Hx   . Hypotension Neg Hx   . Malignant hyperthermia Neg Hx   . Pseudochol deficiency Neg Hx      Social History   Social History  . Marital status: Married    Spouse name: N/A  . Number of children: N/A  . Years of education: N/A   Occupational History  . Not on file.   Social History Main Topics  .  Smoking status: Never Smoker  . Smokeless tobacco: Never Used  . Alcohol use Yes     Comment: occassionally  . Drug use: No  . Sexual activity: Not Currently   Other Topics Concern  . Not on file   Social History Narrative  . No narrative on file     BP 128/70   Pulse 69   Ht 5\' 7"  (1.702 m)   Wt 178 lb 9.6 oz (81 kg)   SpO2 96%   BMI 27.97 kg/m   Physical Exam:  Well appearing 83 year old man,NAD HEENT: Unremarkable Neck:  7 cm JVD, no thyromegally Lungs:  Clear with no wheezes, rales, or rhonchi. HEART:  Regular rate rhythm, no murmurs, no rubs, no clicks Abd:  soft, positive bowel sounds, no organomegally, no rebound, no guarding Ext:  2 plus pulses, no edema, no cyanosis, no clubbing Skin:  No rashes no nodules Neuro:  CN II through XII intact, motor grossly intact  DEVICE  Normal device function.  See PaceArt for details.   Assess/Plan: 1. VT - he  has had no recurrent VT. 2. Chronic systolic heart failure - despite dietary indiscretion, he has maintained euvolemia. I have asked him to reduce the salt in his diet. 3. ICD - his Medtronic device is working normally. Will follow. 4. HTN - his blood pressure is well controlled. Will follow.  Adrian Pace.D.

## 2017-04-25 ENCOUNTER — Other Ambulatory Visit: Payer: Self-pay | Admitting: Internal Medicine

## 2017-04-25 ENCOUNTER — Encounter (HOSPITAL_COMMUNITY): Payer: Self-pay | Admitting: Emergency Medicine

## 2017-04-25 ENCOUNTER — Ambulatory Visit (HOSPITAL_COMMUNITY)
Admission: EM | Admit: 2017-04-25 | Discharge: 2017-04-25 | Disposition: A | Discharge status: Home / Self Care | Payer: Medicare Other | Attending: Internal Medicine | Admitting: Internal Medicine

## 2017-04-25 DIAGNOSIS — S61211A Laceration without foreign body of left index finger without damage to nail, initial encounter: Secondary | ICD-10-CM | POA: Diagnosis not present

## 2017-04-25 DIAGNOSIS — W260XXA Contact with knife, initial encounter: Secondary | ICD-10-CM | POA: Diagnosis not present

## 2017-04-25 NOTE — ED Triage Notes (Addendum)
PT cut left forefinger on a knife last night at 8:30 pm. Bleeding controlled with pressure bandage, but starts again when bandage is removed. PT saw PCP this AM and had TDAP updated this AM

## 2017-04-25 NOTE — Discharge Instructions (Signed)
Follow up in 7-10 days for suture removal.  Keep wound dry for 48 hours.  If wound becomes red and swollen or painful please follow up.

## 2017-04-25 NOTE — ED Provider Notes (Signed)
CSN: [id number]     Arrival date & time 04/25/17  1005 History   None    Chief Complaint  Patient presents with  . Extremity Laceration   (Consider location/radiation/quality/duration/timing/severity/associated sxs/prior Treatment) Patient accidentally cut his right finger last night with a knife.  He just had TD shot today prior to visit.   The history is provided by the patient.  Laceration  Location:  Finger Finger laceration location:  R index finger Length:  2 cm Depth:  Cutaneous Quality: straight   Bleeding: venous and controlled   Time since incident:  12 hours Laceration mechanism:  Knife Pain details:    Quality:  Aching   Severity:  Mild   Timing:  Constant   Progression:  Unchanged Foreign body present:  No foreign bodies Relieved by:  Nothing Worsened by:  Nothing Ineffective treatments:  None tried Tetanus status:  Up to date   Past Medical History:  Diagnosis Date  . Anxiety    Takes Ativan as needed  . Cardiac dysrhythmia, unspecified   . CHF (congestive heart failure) ([place])   . Coronary artery disease    Hx of cardiac caths   . Enlarged prostate   . GERD (gastroesophageal reflux disease)    Takes prilosec  . Gout   . Heart attack ([place])   . HTN (hypertension)   . Hyperkalemia   . Hyperlipemia   . Ischemic cardiomyopathy   . LV dysfunction   . Myocardial infarct ([place]) 1988  . Panic attacks   . Personal history of peptic ulcer disease   . Renal insufficiency   . Syncope and collapse   . Ventricular tachycardia [facility])    Past Surgical History:  Procedure Laterality Date  . CARDIAC CATHETERIZATION  2003  . CARDIAC DEFIBRILLATOR PLACEMENT    . COLONOSCOPY    . CORONARY ANGIOPLASTY    . ESOPHAGOGASTRODUODENOSCOPY     with biopsy  . HERNIA REPAIR  01/01/2012  . INSERT / REPLACE / REMOVE PACEMAKER  2005   ICD; replaced in Jan 2013  . LEAD REVISION N/A 11/08/2011   Procedure: LEAD REVISION;  Surgeon: Thompson Grayer, MD;  [facility] CATH LAB;   Service: Cardiovascular;  Laterality: N/A;  . UMBILICAL HERNIA REPAIR    . UMBILICAL HERNIA REPAIR  01/01/2012   Procedure: HERNIA REPAIR UMBILICAL ADULT;  Surgeon: Merrie Roof, MD;  Location: [place];  Service: General;  Laterality: N/A;  umbilical hernia repair with mesh   Family History  Problem Relation Age of Onset  . Heart attack Father        x2  . Heart disease Father   . Cancer Father        bladder and prostate  . Anesthesia problems Neg Hx   . Hypotension Neg Hx   . Malignant hyperthermia Neg Hx   . Pseudochol deficiency Neg Hx    Social History  Substance Use Topics  . Smoking status: Never Smoker  . Smokeless tobacco: Never Used  . Alcohol use Yes     Comment: occassionally    Review of Systems  Constitutional: Negative.   HENT: Negative.   Eyes: Negative.   Respiratory: Negative.   Cardiovascular: Negative.   Gastrointestinal: Negative.   Endocrine: Negative.   Genitourinary: Negative.   Skin: Negative.   Allergic/Immunologic: Negative.   Neurological: Negative.     Allergies  Codeine  Home Medications   Prior to Admission medications   Medication Sig Start Date End Date Taking? Authorizing Provider  aspirin 325 MG tablet Take 325 mg by mouth daily.      [provider]  benazepril (LOTENSIN) 10 MG tablet Take 1 tablet (10 mg total) by mouth daily. 02/06/17   Lelon Perla, MD  carvedilol (COREG) 25 MG tablet TAKE ONE TABLET BY MOUTH TWICE A DAY WITH A MEAL 04/25/17   Evans Lance, MD  finasteride (PROSCAR) 5 MG tablet Take 5 mg by mouth daily.    [provider]  LORazepam (ATIVAN) 0.5 MG tablet Take 1 tablet by mouth daily as needed for anxiety.  12/08/13   [provider]  simvastatin (ZOCOR) 40 MG tablet TAKE ONE TABLET BY MOUTH DAILY AT 6PM 12/23/16   Lelon Perla, MD   Meds Ordered and Administered this Visit  Medications - No data to display  BP (!) 141/71   Pulse 76   Temp 98.8 F (37.1 C) (Oral)    Resp 16   Ht 5\' 7"  (1.702 m)   Wt 175 lb (79.4 kg)   SpO2 99%   BMI 27.41 kg/m  No data found.   Physical Exam  Constitutional: He appears well-developed and well-nourished.  HENT:  Head: Normocephalic and atraumatic.  Eyes: Conjunctivae and EOM are normal. Pupils are equal, round, and reactive to light.  Neck: Normal range of motion. Neck supple.  Cardiovascular: Normal rate, regular rhythm and normal heart sounds.   Pulmonary/Chest: Effort normal and breath sounds normal.  Skin: There is erythema.  Right index finger with laceration approx 2 cm  Nursing note and vitals reviewed.   Urgent Care Course     .Marland KitchenLaceration Repair Date/Time: 04/25/2017 11:37 AM Performed by: Lysbeth Penner Authorized by: Sherlene Shams   Consent:    Consent obtained:  Verbal   Consent given by:  Patient   Risks discussed:  Infection and pain   Alternatives discussed:  No treatment Anesthesia (see MAR for exact dosages):    Anesthesia method:  Local infiltration   Local anesthetic:  Lidocaine 1% WITH epi Laceration details:    Location:  Finger   Finger location:  R index finger   Length (cm):  2   Depth (mm):  2 Repair type:    Repair type:  Simple Pre-procedure details:    Preparation:  Patient was prepped and draped in usual sterile fashion Exploration:    Hemostasis achieved with:  Direct pressure   Wound exploration: wound explored through full range of motion     Contaminated: no   Treatment:    Area cleansed with:  Betadine and saline   Amount of cleaning:  Standard   Irrigation solution:  Sterile saline   Irrigation volume:  120 ml   Irrigation method:  Syringe   Visualized foreign bodies/material removed: no   Skin repair:    Repair method:  Sutures   Suture size:  4-0   Suture material:  Nylon   Suture technique:  Simple interrupted   Number of sutures:  4 Approximation:    Approximation:  Close   [place] border: well-aligned   Post-procedure details:     Dressing:  Adhesive bandage   Patient tolerance of procedure:  Tolerated well, no immediate complications   (including critical care time)  Labs Review Labs Reviewed - No data to display  Imaging Review No results found.   Visual Acuity Review  Right Eye Distance:   Left Eye Distance:   Bilateral Distance:    Right Eye Near:   Left Eye Near:  Bilateral Near:         MDM   1. Laceration of left index finger without foreign body, nail damage status unspecified, initial encounter    #4 4.0 nylon sutures placed and follow up prn infection and in 7-10 days for suture removal.      Julien, Oscar, FNP 04/25/17 [place], FNP 04/25/17 1140

## 2017-06-03 ENCOUNTER — Ambulatory Visit (INDEPENDENT_AMBULATORY_CARE_PROVIDER_SITE_OTHER): Payer: Medicare Other | Admitting: *Deleted

## 2017-06-03 DIAGNOSIS — I255 Ischemic cardiomyopathy: Secondary | ICD-10-CM

## 2017-06-03 NOTE — Progress Notes (Signed)
Remote ICD transmission.   

## 2017-06-04 ENCOUNTER — Encounter: Payer: Self-pay | Admitting: Cardiology

## 2017-06-20 LAB — CUP PACEART REMOTE DEVICE CHECK
Battery Voltage: 3.04 V
Brady Statistic RV Percent Paced: 0.04 %
HIGH POWER IMPEDANCE MEASURED VALUE: 304 Ohm
HIGH POWER IMPEDANCE MEASURED VALUE: 58 Ohm
Lead Channel Impedance Value: 418 Ohm
Lead Channel Sensing Intrinsic Amplitude: 7.5 mV
Lead Channel Setting Pacing Pulse Width: 0.4 ms
Lead Channel Setting Sensing Sensitivity: 0.3 mV
MDC IDC LEAD IMPLANT DT: 20130111
MDC IDC LEAD LOCATION: 753860
MDC IDC MSMT LEADCHNL RV PACING THRESHOLD AMPLITUDE: 0.375 V
MDC IDC MSMT LEADCHNL RV PACING THRESHOLD PULSEWIDTH: 0.4 ms
MDC IDC MSMT LEADCHNL RV SENSING INTR AMPL: 7.5 mV
MDC IDC PG IMPLANT DT: 20130111
MDC IDC SESS DTM: 20180807041608
MDC IDC SET LEADCHNL RV PACING AMPLITUDE: 2.5 V

## 2017-06-23 ENCOUNTER — Encounter: Payer: Self-pay | Admitting: Cardiology

## 2017-08-10 ENCOUNTER — Other Ambulatory Visit: Payer: Self-pay | Admitting: Cardiology

## 2017-08-11 NOTE — Telephone Encounter (Signed)
REFILL 

## 2017-09-02 ENCOUNTER — Ambulatory Visit (INDEPENDENT_AMBULATORY_CARE_PROVIDER_SITE_OTHER): Payer: Medicare Other | Admitting: *Deleted

## 2017-09-02 DIAGNOSIS — I255 Ischemic cardiomyopathy: Secondary | ICD-10-CM

## 2017-09-02 NOTE — Progress Notes (Signed)
Remote ICD transmission.   

## 2017-09-04 ENCOUNTER — Encounter: Payer: Self-pay | Admitting: Cardiology

## 2017-09-06 LAB — CUP PACEART REMOTE DEVICE CHECK
Brady Statistic RV Percent Paced: 0.08 %
HighPow Impedance: 304 Ohm
HighPow Impedance: 70 Ohm
Implantable Lead Implant Date: 20130111
Implantable Lead Location: 753860
Implantable Lead Model: 6935
Lead Channel Impedance Value: 418 Ohm
Lead Channel Pacing Threshold Amplitude: 0.375 V
Lead Channel Setting Pacing Amplitude: 2.5 V
Lead Channel Setting Pacing Pulse Width: 0.4 ms
Lead Channel Setting Sensing Sensitivity: 0.3 mV
MDC IDC MSMT BATTERY VOLTAGE: 3.06 V
MDC IDC MSMT LEADCHNL RV PACING THRESHOLD PULSEWIDTH: 0.4 ms
MDC IDC MSMT LEADCHNL RV SENSING INTR AMPL: 7.5 mV
MDC IDC MSMT LEADCHNL RV SENSING INTR AMPL: 7.5 mV
MDC IDC PG IMPLANT DT: 20130111
MDC IDC SESS DTM: 20181106083627

## 2017-10-22 DIAGNOSIS — I214 Non-ST elevation (NSTEMI) myocardial infarction: Secondary | ICD-10-CM | POA: Insufficient documentation

## 2017-10-22 DIAGNOSIS — Z8711 Personal history of peptic ulcer disease: Secondary | ICD-10-CM | POA: Insufficient documentation

## 2017-10-22 DIAGNOSIS — I472 Ventricular tachycardia, unspecified: Secondary | ICD-10-CM | POA: Insufficient documentation

## 2017-10-22 DIAGNOSIS — K219 Gastro-esophageal reflux disease without esophagitis: Secondary | ICD-10-CM | POA: Insufficient documentation

## 2017-10-22 DIAGNOSIS — N400 Enlarged prostate without lower urinary tract symptoms: Secondary | ICD-10-CM | POA: Insufficient documentation

## 2017-10-22 DIAGNOSIS — I509 Heart failure, unspecified: Secondary | ICD-10-CM | POA: Insufficient documentation

## 2017-11-05 NOTE — Progress Notes (Signed)
HPI: FU coronary disease. The patient had his last cardiac catheterization in 2003. He had PCI of his LAD and right coronary artery. His ejection fraction was 30%. Myoview in Sept of 2012 showed EF 22, anteroapical infarct; no ischemia. Patient had ICD system revision in Jan 2013. Abd ultrasound 10/14 showed no aneurysm. Since I last saw him, the patient denies any dyspnea on exertion, orthopnea, PND, pedal edema, palpitations, syncope or chest pain.   Current Outpatient Medications  Medication Sig Dispense Refill  . aspirin 325 MG tablet Take 325 mg by mouth daily.      . benazepril (LOTENSIN) 10 MG tablet TAKE ONE TABLET BY MOUTH DAILY 90 tablet 2  . carvedilol (COREG) 25 MG tablet TAKE ONE TABLET BY MOUTH TWICE A DAY WITH A MEAL 180 tablet 3  . finasteride (PROSCAR) 5 MG tablet Take 5 mg by mouth daily.    Marland Kitchen LORazepam (ATIVAN) 0.5 MG tablet Take 1 tablet by mouth daily as needed for anxiety.     . simvastatin (ZOCOR) 40 MG tablet TAKE ONE TABLET BY MOUTH DAILY AT 6PM 90 tablet 3   No current facility-administered medications for this visit.      Past Medical History:  Diagnosis Date  . Anxiety    Takes Ativan as needed  . Cardiac dysrhythmia, unspecified   . CHF (congestive heart failure) ([place])   . Coronary artery disease    Hx of cardiac caths   . Enlarged prostate   . GERD (gastroesophageal reflux disease)    Takes prilosec  . Gout   . Heart attack ([place])   . HTN (hypertension)   . Hyperkalemia   . Hyperlipemia   . Ischemic cardiomyopathy   . LV dysfunction   . Myocardial infarct ([place]) 1988  . Panic attacks   . Personal history of peptic ulcer disease   . Renal insufficiency   . Syncope and collapse   . Ventricular tachycardia [facility])     Past Surgical History:  Procedure Laterality Date  . CARDIAC CATHETERIZATION  2003  . CARDIAC DEFIBRILLATOR PLACEMENT    . COLONOSCOPY    . CORONARY ANGIOPLASTY    . ESOPHAGOGASTRODUODENOSCOPY     with biopsy  . HERNIA  REPAIR  01/01/2012  . INSERT / REPLACE / REMOVE PACEMAKER  2005   ICD; replaced in Jan 2013  . LEAD REVISION N/A 11/08/2011   Procedure: LEAD REVISION;  Surgeon: Thompson Grayer, MD;  Location: [facility] CATH LAB;  Service: Cardiovascular;  Laterality: N/A;  . UMBILICAL HERNIA REPAIR    . UMBILICAL HERNIA REPAIR  01/01/2012   Procedure: HERNIA REPAIR UMBILICAL ADULT;  Surgeon: Merrie Roof, MD;  Location: [place];  Service: General;  Laterality: N/A;  umbilical hernia repair with mesh    Social History   Socioeconomic History  . Marital status: Married    Spouse name: Not on file  . Number of children: Not on file  . Years of education: Not on file  . Highest education level: Not on file  Social Needs  . Financial resource strain: Not on file  . Food insecurity - worry: Not on file  . Food insecurity - inability: Not on file  . Transportation needs - medical: Not on file  . Transportation needs - non-medical: Not on file  Occupational History  . Not on file  Tobacco Use  . Smoking status: Never Smoker  . Smokeless tobacco: Never Used  Substance and Sexual Activity  . Alcohol  use: Yes    Comment: occassionally  . Drug use: No  . Sexual activity: Not Currently  Other Topics Concern  . Not on file  Social History Narrative  . Not on file    Family History  Problem Relation Age of Onset  . Heart attack Father        x2  . Heart disease Father   . Cancer Father        bladder and prostate  . Anesthesia problems Neg Hx   . Hypotension Neg Hx   . Malignant hyperthermia Neg Hx   . Pseudochol deficiency Neg Hx     ROS: anxiety but no fevers or chills, productive cough, hemoptysis, dysphasia, odynophagia, melena, hematochezia, dysuria, hematuria, rash, seizure activity, orthopnea, PND, pedal edema, claudication. Remaining systems are negative.  Physical Exam: Well-developed well-nourished in no acute distress.  Skin is warm and dry.  HEENT is normal.  Neck is supple.  Chest is  clear to auscultation with normal expansion.  Cardiovascular exam is regular rate and rhythm.  Abdominal exam nontender or distended. No masses palpated. Extremities show no edema. neuro grossly intact  ECG- normal sinus rhythm at a rate of 69. Left axis deviation. Anteroseptal infarct with lateral T-wave inversion. personally reviewed  A/P  1 coronary artery disease-plan to continue aspirin and statin. Patient denies chest pain.  2 ischemic cardiomyopathy-continue ACE inhibitor and beta blocker.  3 hypertension-blood pressure is controlled. Continue present medications. Potassium and renal function monitored by primary care.  4 hyperlipidemia-continue statin. Lipids and liver monitored by primary care.  5 prior ICD-patient is followed by electrophysiology.  Kirk Ruths, MD

## 2017-11-12 ENCOUNTER — Encounter: Payer: Self-pay | Admitting: Cardiology

## 2017-11-12 ENCOUNTER — Ambulatory Visit: Payer: Medicare Other | Admitting: Cardiology

## 2017-11-12 VITALS — BP 104/62 | HR 69 | Ht 67.5 in | Wt 178.0 lb

## 2017-11-12 DIAGNOSIS — E7800 Pure hypercholesterolemia, unspecified: Secondary | ICD-10-CM

## 2017-11-12 DIAGNOSIS — I10 Essential (primary) hypertension: Secondary | ICD-10-CM | POA: Diagnosis not present

## 2017-11-12 DIAGNOSIS — I2510 Atherosclerotic heart disease of native coronary artery without angina pectoris: Secondary | ICD-10-CM

## 2017-11-12 NOTE — Patient Instructions (Signed)
Your physician wants you to follow-up in: 6 MONTHS WITH DR CRENSHAW You will receive a reminder letter in the mail two months in advance. If you don't receive a letter, please call our office to schedule the follow-up appointment.   If you need a refill on your cardiac medications before your next appointment, please call your pharmacy.  

## 2017-12-02 ENCOUNTER — Ambulatory Visit (INDEPENDENT_AMBULATORY_CARE_PROVIDER_SITE_OTHER): Payer: Medicare Other | Admitting: *Deleted

## 2017-12-02 DIAGNOSIS — I255 Ischemic cardiomyopathy: Secondary | ICD-10-CM | POA: Diagnosis not present

## 2017-12-02 NOTE — Progress Notes (Signed)
Remote ICD transmission.   

## 2017-12-03 ENCOUNTER — Encounter: Payer: Self-pay | Admitting: Cardiology

## 2017-12-24 LAB — CUP PACEART REMOTE DEVICE CHECK
Battery Voltage: 3.01 V
Brady Statistic RV Percent Paced: 0.08 %
HIGH POWER IMPEDANCE MEASURED VALUE: 62 Ohm
HighPow Impedance: 304 Ohm
Lead Channel Pacing Threshold Pulse Width: 0.4 ms
Lead Channel Sensing Intrinsic Amplitude: 7.625 mV
Lead Channel Sensing Intrinsic Amplitude: 7.625 mV
Lead Channel Setting Pacing Amplitude: 2.5 V
Lead Channel Setting Pacing Pulse Width: 0.4 ms
MDC IDC LEAD IMPLANT DT: 20130111
MDC IDC LEAD LOCATION: 753860
MDC IDC MSMT LEADCHNL RV IMPEDANCE VALUE: 418 Ohm
MDC IDC MSMT LEADCHNL RV PACING THRESHOLD AMPLITUDE: 0.375 V
MDC IDC PG IMPLANT DT: 20130111
MDC IDC SESS DTM: 20190205062307
MDC IDC SET LEADCHNL RV SENSING SENSITIVITY: 0.3 mV

## 2018-01-06 ENCOUNTER — Other Ambulatory Visit: Payer: Self-pay | Admitting: Cardiology

## 2018-03-03 ENCOUNTER — Ambulatory Visit (INDEPENDENT_AMBULATORY_CARE_PROVIDER_SITE_OTHER): Payer: Medicare Other | Admitting: *Deleted

## 2018-03-03 DIAGNOSIS — I255 Ischemic cardiomyopathy: Secondary | ICD-10-CM

## 2018-03-03 NOTE — Progress Notes (Signed)
Remote ICD transmission.   

## 2018-03-04 ENCOUNTER — Encounter: Payer: Self-pay | Admitting: Cardiology

## 2018-03-20 LAB — CUP PACEART REMOTE DEVICE CHECK
Date Time Interrogation Session: 20190507083727
HighPow Impedance: 304 Ohm
HighPow Impedance: 67 Ohm
Implantable Lead Implant Date: 20130111
Implantable Lead Location: 753860
Implantable Lead Model: 6935
Lead Channel Impedance Value: 399 Ohm
Lead Channel Pacing Threshold Amplitude: 0.375 V
Lead Channel Setting Pacing Amplitude: 2.5 V
Lead Channel Setting Sensing Sensitivity: 0.3 mV
MDC IDC MSMT BATTERY VOLTAGE: 3.03 V
MDC IDC MSMT LEADCHNL RV PACING THRESHOLD PULSEWIDTH: 0.4 ms
MDC IDC MSMT LEADCHNL RV SENSING INTR AMPL: 7.375 mV
MDC IDC MSMT LEADCHNL RV SENSING INTR AMPL: 7.375 mV
MDC IDC PG IMPLANT DT: 20130111
MDC IDC SET LEADCHNL RV PACING PULSEWIDTH: 0.4 ms
MDC IDC STAT BRADY RV PERCENT PACED: 0.14 %

## 2018-03-25 ENCOUNTER — Encounter: Payer: Self-pay | Admitting: Internal Medicine

## 2018-03-25 ENCOUNTER — Ambulatory Visit: Payer: Medicare Other | Admitting: Internal Medicine

## 2018-03-25 VITALS — BP 118/64 | HR 66 | Ht 67.0 in | Wt 180.4 lb

## 2018-03-25 DIAGNOSIS — I472 Ventricular tachycardia, unspecified: Secondary | ICD-10-CM

## 2018-03-25 DIAGNOSIS — I5022 Chronic systolic (congestive) heart failure: Secondary | ICD-10-CM | POA: Diagnosis not present

## 2018-03-25 DIAGNOSIS — Z95810 Presence of automatic (implantable) cardiac defibrillator: Secondary | ICD-10-CM | POA: Diagnosis not present

## 2018-03-25 DIAGNOSIS — I255 Ischemic cardiomyopathy: Secondary | ICD-10-CM

## 2018-03-25 NOTE — Patient Instructions (Signed)
Medication Instructions:  Your physician recommends that you continue on your current medications as directed. Please refer to the Current Medication list given to you today.  Labwork: None ordered.  Testing/Procedures: None ordered.  Follow-Up: Your physician wants you to follow-up in: one year with Dr. Lovena Le.   You will receive a reminder letter in the mail two months in advance. If you don't receive a letter, please call our office to schedule the follow-up appointment.  Remote monitoring is used to monitor your ICD from home. This monitoring reduces the number of office visits required to check your device to one time per year. It allows [place] to keep an eye on the functioning of your device to ensure it is working properly. You are scheduled for a device check from home on 06/02/2018. You may send your transmission at any time that day. If you have a wireless device, the transmission will be sent automatically. After your physician reviews your transmission, you will receive a postcard with your next transmission date.  Any Other Special Instructions Will Be Listed Below (If Applicable).  If you need a refill on your cardiac medications before your next appointment, please call your pharmacy.

## 2018-03-25 NOTE — Progress Notes (Signed)
HPI Adrian Pace returns today for followup. He is a very pleasant 84 year old man with an ischemic cardiomyopathy, chronic systolic heart failure, ventricular tachycardia, and hypertension. In the interim, he has done well and has not had any CHF exacerbations.  He denies chest pain, shortness of breath, or syncope. No peripheral edema. He has had no ICD shock.   Allergies  Allergen Reactions  . Codeine Anxiety     Current Outpatient Medications  Medication Sig Dispense Refill  . aspirin 325 MG tablet Take 325 mg by mouth daily.      . benazepril (LOTENSIN) 10 MG tablet TAKE ONE TABLET BY MOUTH DAILY 90 tablet 2  . carvedilol (COREG) 25 MG tablet TAKE ONE TABLET BY MOUTH TWICE A DAY WITH A MEAL 180 tablet 3  . finasteride (PROSCAR) 5 MG tablet Take 5 mg by mouth daily.    Marland Kitchen LORazepam (ATIVAN) 0.5 MG tablet Take 1 tablet by mouth daily as needed for anxiety.     . simvastatin (ZOCOR) 40 MG tablet TAKE ONE TABLET BY MOUTH DAILY AT 6PM 90 tablet 2   No current facility-administered medications for this visit.      Past Medical History:  Diagnosis Date  . Anxiety    Takes Ativan as needed  . Cardiac dysrhythmia, unspecified   . CHF (congestive heart failure) ([place])   . Coronary artery disease    Hx of cardiac caths   . Enlarged prostate   . GERD (gastroesophageal reflux disease)    Takes prilosec  . Gout   . Heart attack ([place])   . HTN (hypertension)   . Hyperkalemia   . Hyperlipemia   . Ischemic cardiomyopathy   . LV dysfunction   . Myocardial infarct ([place]) 1988  . Panic attacks   . Personal history of peptic ulcer disease   . Renal insufficiency   . Syncope and collapse   . Ventricular tachycardia ([place])     ROS:   All systems reviewed and negative except as noted in the HPI.   Past Surgical History:  Procedure Laterality Date  . CARDIAC CATHETERIZATION  2003  . CARDIAC DEFIBRILLATOR PLACEMENT    . COLONOSCOPY    . CORONARY ANGIOPLASTY    .  ESOPHAGOGASTRODUODENOSCOPY     with biopsy  . HERNIA REPAIR  01/01/2012  . INSERT / REPLACE / REMOVE PACEMAKER  2005   ICD; replaced in Jan 2013  . LEAD REVISION N/A 11/08/2011   Procedure: LEAD REVISION;  Surgeon: Thompson Grayer, MD;  Location: [facility] CATH LAB;  Service: Cardiovascular;  Laterality: N/A;  . UMBILICAL HERNIA REPAIR    . UMBILICAL HERNIA REPAIR  01/01/2012   Procedure: HERNIA REPAIR UMBILICAL ADULT;  Surgeon: Merrie Roof, MD;  Location: [place];  Service: General;  Laterality: N/A;  umbilical hernia repair with mesh     Family History  Problem Relation Age of Onset  . Heart attack Father        x2  . Heart disease Father   . Cancer Father        bladder and prostate  . Anesthesia problems Neg Hx   . Hypotension Neg Hx   . Malignant hyperthermia Neg Hx   . Pseudochol deficiency Neg Hx      Social History   Socioeconomic History  . Marital status: Married    Spouse name: Not on file  . Number of children: Not on file  . Years of education: Not on file  .  Highest education level: Not on file  Occupational History  . Not on file  Social Needs  . Financial resource strain: Not on file  . Food insecurity:    Worry: Not on file    Inability: Not on file  . Transportation needs:    Medical: Not on file    Non-medical: Not on file  Tobacco Use  . Smoking status: Never Smoker  . Smokeless tobacco: Never Used  Substance and Sexual Activity  . Alcohol use: Yes    Comment: occassionally  . Drug use: No  . Sexual activity: Not Currently  Lifestyle  . Physical activity:    Days per week: Not on file    Minutes per session: Not on file  . Stress: Not on file  Relationships  . Social connections:    Talks on phone: Not on file    Gets together: Not on file    Attends religious service: Not on file    Active member of club or organization: Not on file    Attends meetings of clubs or organizations: Not on file    Relationship status: Not on file  . Intimate  partner violence:    Fear of current or ex partner: Not on file    Emotionally abused: Not on file    Physically abused: Not on file    Forced sexual activity: Not on file  Other Topics Concern  . Not on file  Social History Narrative  . Not on file     BP 118/64   Pulse 66   Ht 5\' 7"  (1.702 m)   Wt 180 lb 6.4 oz (81.8 kg)   SpO2 95%   BMI 28.25 kg/m   Physical Exam:  Well appearing diskempt 84 yo man, NAD HEENT: Unremarkable Neck:  No JVD, no thyromegally Lymphatics:  No adenopathy Back:  No CVA tenderness Lungs:  Clear with no wheezes HEART:  Regular rate rhythm, no murmurs, no rubs, no clicks Abd:  soft, positive bowel sounds, no organomegally, no rebound, no guarding Ext:  2 plus pulses, no edema, no cyanosis, no clubbing Skin:  No rashes no nodules Neuro:  CN II through XII intact, motor grossly intact  EKG - NSR with PVC's  DEVICE  Normal device function.  See PaceArt for details.   Assess/Plan: 1. VT - he has had no recurrent VT. He will continue his current meds. 2. Chronic systolic heart failure - his symptoms are class 2. He will continue his current meds. 3. HTN - his blood pressure is well controlled. Will follow. 4. ICD - his Medtronic device is working normally. Will recheck in several months.  Mikle Bosworth.D.

## 2018-03-31 LAB — CUP PACEART INCLINIC DEVICE CHECK
Battery Voltage: 3.03 V
Brady Statistic RV Percent Paced: 0.08 %
Date Time Interrogation Session: 20190529131511
HIGH POWER IMPEDANCE MEASURED VALUE: 342 Ohm
HIGH POWER IMPEDANCE MEASURED VALUE: 63 Ohm
Implantable Lead Location: 753860
Implantable Pulse Generator Implant Date: 20130111
Lead Channel Impedance Value: 399 Ohm
Lead Channel Pacing Threshold Pulse Width: 0.4 ms
Lead Channel Setting Pacing Amplitude: 2.5 V
Lead Channel Setting Pacing Pulse Width: 0.4 ms
Lead Channel Setting Sensing Sensitivity: 0.3 mV
MDC IDC LEAD IMPLANT DT: 20130111
MDC IDC MSMT LEADCHNL RV PACING THRESHOLD AMPLITUDE: 0.5 V
MDC IDC MSMT LEADCHNL RV SENSING INTR AMPL: 9.5 mV

## 2018-05-16 ENCOUNTER — Other Ambulatory Visit: Payer: Self-pay | Admitting: Internal Medicine

## 2018-05-19 ENCOUNTER — Encounter: Payer: Self-pay | Admitting: Internal Medicine

## 2018-05-26 NOTE — Progress Notes (Signed)
HPI: FU coronary disease. The patient had his last cardiac catheterization in 2003. He had PCI of his LAD and right coronary artery. His ejection fraction was 30%. Myoview in Sept of 2012 showed EF 22, anteroapical infarct; no ischemia. Patient had ICD system revision in Jan 2013. Abd ultrasound 10/14 showed no aneurysm. Since I last saw him, the patient has dyspnea with more extreme activities but not with routine activities. It is relieved with rest. It is not associated with chest pain. There is no orthopnea, PND or pedal edema. There is no syncope or palpitations. There is no exertional chest pain.   Current Outpatient Medications  Medication Sig Dispense Refill  . aspirin 325 MG tablet Take 325 mg by mouth daily.      . benazepril (LOTENSIN) 10 MG tablet TAKE ONE TABLET BY MOUTH DAILY 90 tablet 2  . carvedilol (COREG) 25 MG tablet TAKE ONE TABLET BY MOUTH TWICE A DAY WITH A MEAL 180 tablet 2  . finasteride (PROSCAR) 5 MG tablet Take 5 mg by mouth daily.    Marland Kitchen LORazepam (ATIVAN) 0.5 MG tablet Take 1 tablet by mouth daily as needed for anxiety.     . simvastatin (ZOCOR) 40 MG tablet TAKE ONE TABLET BY MOUTH DAILY AT 6PM 90 tablet 2   No current facility-administered medications for this visit.      Past Medical History:  Diagnosis Date  . Anxiety    Takes Ativan as needed  . Cardiac dysrhythmia, unspecified   . CHF (congestive heart failure) ([place])   . Coronary artery disease    Hx of cardiac caths   . Enlarged prostate   . GERD (gastroesophageal reflux disease)    Takes prilosec  . Gout   . Heart attack ([place])   . HTN (hypertension)   . Hyperkalemia   . Hyperlipemia   . Ischemic cardiomyopathy   . LV dysfunction   . Myocardial infarct ([place]) 1988  . Panic attacks   . Personal history of peptic ulcer disease   . Renal insufficiency   . Syncope and collapse   . Ventricular tachycardia [facility])     Past Surgical History:  Procedure Laterality Date  . CARDIAC  CATHETERIZATION  2003  . CARDIAC DEFIBRILLATOR PLACEMENT    . COLONOSCOPY    . CORONARY ANGIOPLASTY    . ESOPHAGOGASTRODUODENOSCOPY     with biopsy  . HERNIA REPAIR  01/01/2012  . INSERT / REPLACE / REMOVE PACEMAKER  2005   ICD; replaced in Jan 2013  . LEAD REVISION N/A 11/08/2011   Procedure: LEAD REVISION;  Surgeon: Thompson Grayer, MD;  Location: [facility] CATH LAB;  Service: Cardiovascular;  Laterality: N/A;  . UMBILICAL HERNIA REPAIR    . UMBILICAL HERNIA REPAIR  01/01/2012   Procedure: HERNIA REPAIR UMBILICAL ADULT;  Surgeon: Merrie Roof, MD;  Location: [facility];  Service: General;  Laterality: N/A;  umbilical hernia repair with mesh    Social History   Socioeconomic History  . Marital status: Married    Spouse name: Not on file  . Number of children: Not on file  . Years of education: Not on file  . Highest education level: Not on file  Occupational History  . Not on file  Social Needs  . Financial resource strain: Not on file  . Food insecurity:    Worry: Not on file    Inability: Not on file  . Transportation needs:    Medical: Not on file  Non-medical: Not on file  Tobacco Use  . Smoking status: Never Smoker  . Smokeless tobacco: Never Used  Substance and Sexual Activity  . Alcohol use: Yes    Comment: occassionally  . Drug use: No  . Sexual activity: Not Currently  Lifestyle  . Physical activity:    Days per week: Not on file    Minutes per session: Not on file  . Stress: Not on file  Relationships  . Social connections:    Talks on phone: Not on file    Gets together: Not on file    Attends religious service: Not on file    Active member of club or organization: Not on file    Attends meetings of clubs or organizations: Not on file    Relationship status: Not on file  . Intimate partner violence:    Fear of current or ex partner: Not on file    Emotionally abused: Not on file    Physically abused: Not on file    Forced sexual activity: Not on file  Other  Topics Concern  . Not on file  Social History Narrative  . Not on file    Family History  Problem Relation Age of Onset  . Heart attack Father        x2  . Heart disease Father   . Cancer Father        bladder and prostate  . Anesthesia problems Neg Hx   . Hypotension Neg Hx   . Malignant hyperthermia Neg Hx   . Pseudochol deficiency Neg Hx     ROS: no fevers or chills, productive cough, hemoptysis, dysphasia, odynophagia, melena, hematochezia, dysuria, hematuria, rash, seizure activity, orthopnea, PND, pedal edema, claudication. Remaining systems are negative.  Physical Exam: Well-developed well-nourished in no acute distress.  Skin is warm and dry.  HEENT is normal.  Neck is supple.  Chest is clear to auscultation with normal expansion.  Cardiovascular exam is regular rate and rhythm.  Abdominal exam nontender or distended. No masses palpated. Extremities show no edema. neuro grossly intact   A/P  1 coronary artery disease-patient doing well with no dyspnea or chest pain.  Continue medical therapy with aspirin and Zocor.  2 ischemic cardiomyopathy-no symptoms of congestive heart failure.  Continue medical therapy with ACE inhibitor and beta-blocker.  No symptoms of congestive heart failure.  3 hypertension-blood pressure is controlled.  Continue present medications.  4 Hyperlipidemia-continue Zocor.  Lipids and liver monitored by primary care.  5 prior ICD-followed by Dr. Lovena Le.  Kirk Ruths, MD

## 2018-05-30 ENCOUNTER — Other Ambulatory Visit: Payer: Self-pay | Admitting: Cardiology

## 2018-06-01 ENCOUNTER — Ambulatory Visit: Payer: Medicare Other | Admitting: Cardiology

## 2018-06-01 ENCOUNTER — Encounter: Payer: Self-pay | Admitting: Cardiology

## 2018-06-01 VITALS — BP 126/68 | HR 76 | Ht 67.0 in | Wt 181.0 lb

## 2018-06-01 DIAGNOSIS — E7800 Pure hypercholesterolemia, unspecified: Secondary | ICD-10-CM

## 2018-06-01 DIAGNOSIS — I2510 Atherosclerotic heart disease of native coronary artery without angina pectoris: Secondary | ICD-10-CM

## 2018-06-01 DIAGNOSIS — I10 Essential (primary) hypertension: Secondary | ICD-10-CM | POA: Diagnosis not present

## 2018-06-01 NOTE — Patient Instructions (Signed)
Your physician wants you to follow-up in: ONE YEAR WITH DR CRENSHAW You will receive a reminder letter in the mail two months in advance. If you don't receive a letter, please call our office to schedule the follow-up appointment.   If you need a refill on your cardiac medications before your next appointment, please call your pharmacy.  

## 2018-06-02 ENCOUNTER — Ambulatory Visit (INDEPENDENT_AMBULATORY_CARE_PROVIDER_SITE_OTHER): Payer: Medicare Other | Admitting: *Deleted

## 2018-06-02 DIAGNOSIS — I255 Ischemic cardiomyopathy: Secondary | ICD-10-CM

## 2018-06-02 DIAGNOSIS — I5022 Chronic systolic (congestive) heart failure: Secondary | ICD-10-CM

## 2018-06-03 NOTE — Progress Notes (Signed)
Remote ICD transmission.   

## 2018-06-19 LAB — CUP PACEART REMOTE DEVICE CHECK
Battery Voltage: 2.98 V
Brady Statistic RV Percent Paced: 0.35 %
HIGH POWER IMPEDANCE MEASURED VALUE: 342 Ohm
HIGH POWER IMPEDANCE MEASURED VALUE: 66 Ohm
Lead Channel Sensing Intrinsic Amplitude: 7.25 mV
Lead Channel Sensing Intrinsic Amplitude: 7.25 mV
Lead Channel Setting Pacing Amplitude: 2.5 V
Lead Channel Setting Pacing Pulse Width: 0.4 ms
MDC IDC LEAD IMPLANT DT: 20130111
MDC IDC LEAD LOCATION: 753860
MDC IDC MSMT LEADCHNL RV IMPEDANCE VALUE: 361 Ohm
MDC IDC MSMT LEADCHNL RV PACING THRESHOLD AMPLITUDE: 0.375 V
MDC IDC MSMT LEADCHNL RV PACING THRESHOLD PULSEWIDTH: 0.4 ms
MDC IDC PG IMPLANT DT: 20130111
MDC IDC SESS DTM: 20190806073524
MDC IDC SET LEADCHNL RV SENSING SENSITIVITY: 0.3 mV

## 2018-09-01 ENCOUNTER — Ambulatory Visit (INDEPENDENT_AMBULATORY_CARE_PROVIDER_SITE_OTHER): Payer: Medicare Other | Admitting: *Deleted

## 2018-09-01 DIAGNOSIS — I255 Ischemic cardiomyopathy: Secondary | ICD-10-CM

## 2018-09-01 DIAGNOSIS — I5022 Chronic systolic (congestive) heart failure: Secondary | ICD-10-CM

## 2018-09-02 NOTE — Progress Notes (Signed)
Remote ICD transmission.   

## 2018-09-06 ENCOUNTER — Encounter: Payer: Self-pay | Admitting: Cardiology

## 2018-10-10 ENCOUNTER — Other Ambulatory Visit: Payer: Self-pay | Admitting: Cardiology

## 2018-10-12 NOTE — Telephone Encounter (Signed)
Rx request sent to pharmacy.  

## 2018-11-01 LAB — CUP PACEART REMOTE DEVICE CHECK
Brady Statistic RV Percent Paced: 0.24 %
Date Time Interrogation Session: 20191105083523
HIGH POWER IMPEDANCE MEASURED VALUE: 342 Ohm
HighPow Impedance: 71 Ohm
Implantable Lead Implant Date: 20130111
Implantable Lead Location: 753860
Implantable Pulse Generator Implant Date: 20130111
Lead Channel Impedance Value: 418 Ohm
Lead Channel Pacing Threshold Amplitude: 0.375 V
Lead Channel Sensing Intrinsic Amplitude: 7.875 mV
Lead Channel Sensing Intrinsic Amplitude: 7.875 mV
Lead Channel Setting Pacing Amplitude: 2.5 V
MDC IDC MSMT BATTERY VOLTAGE: 3 V
MDC IDC MSMT LEADCHNL RV PACING THRESHOLD PULSEWIDTH: 0.4 ms
MDC IDC SET LEADCHNL RV PACING PULSEWIDTH: 0.4 ms
MDC IDC SET LEADCHNL RV SENSING SENSITIVITY: 0.3 mV

## 2018-11-02 ENCOUNTER — Other Ambulatory Visit: Payer: Self-pay | Admitting: General Surgery

## 2018-11-02 DIAGNOSIS — R1032 Left lower quadrant pain: Secondary | ICD-10-CM

## 2018-11-06 ENCOUNTER — Ambulatory Visit
Admission: RE | Admit: 2018-11-06 | Discharge: 2018-11-06 | Disposition: A | Discharge status: Home / Self Care | Payer: Medicare Other | Source: Ambulatory Visit | Attending: General Surgery | Admitting: General Surgery

## 2018-11-06 DIAGNOSIS — R1032 Left lower quadrant pain: Secondary | ICD-10-CM

## 2018-12-01 ENCOUNTER — Ambulatory Visit (INDEPENDENT_AMBULATORY_CARE_PROVIDER_SITE_OTHER): Payer: Medicare Other

## 2018-12-01 DIAGNOSIS — I255 Ischemic cardiomyopathy: Secondary | ICD-10-CM | POA: Diagnosis not present

## 2018-12-01 DIAGNOSIS — I5022 Chronic systolic (congestive) heart failure: Secondary | ICD-10-CM

## 2018-12-03 LAB — CUP PACEART REMOTE DEVICE CHECK
Brady Statistic RV Percent Paced: 0.13 %
HIGH POWER IMPEDANCE MEASURED VALUE: 61 Ohm
HighPow Impedance: 342 Ohm
Implantable Lead Implant Date: 20130111
Implantable Lead Model: 6935
Lead Channel Impedance Value: 418 Ohm
Lead Channel Pacing Threshold Amplitude: 0.375 V
Lead Channel Sensing Intrinsic Amplitude: 7.5 mV
Lead Channel Setting Pacing Amplitude: 2.5 V
MDC IDC LEAD LOCATION: 753860
MDC IDC MSMT BATTERY VOLTAGE: 2.92 V
MDC IDC MSMT LEADCHNL RV PACING THRESHOLD PULSEWIDTH: 0.4 ms
MDC IDC MSMT LEADCHNL RV SENSING INTR AMPL: 7.5 mV
MDC IDC PG IMPLANT DT: 20130111
MDC IDC SESS DTM: 20200204062503
MDC IDC SET LEADCHNL RV PACING PULSEWIDTH: 0.4 ms
MDC IDC SET LEADCHNL RV SENSING SENSITIVITY: 0.3 mV

## 2018-12-10 NOTE — Progress Notes (Signed)
Remote ICD transmission.   

## 2019-02-11 ENCOUNTER — Other Ambulatory Visit: Payer: Self-pay | Admitting: Internal Medicine

## 2019-02-20 ENCOUNTER — Observation Stay (HOSPITAL_COMMUNITY): Payer: Medicare Other

## 2019-02-20 ENCOUNTER — Emergency Department (HOSPITAL_COMMUNITY): Payer: Medicare Other

## 2019-02-20 ENCOUNTER — Inpatient Hospital Stay (HOSPITAL_COMMUNITY)
Admission: EM | Admit: 2019-02-20 | Discharge: 2019-02-23 | DRG: 247 | Disposition: A | Discharge status: Home / Self Care | Payer: Medicare Other | Attending: Family Medicine | Admitting: Family Medicine

## 2019-02-20 ENCOUNTER — Encounter (HOSPITAL_COMMUNITY): Payer: Self-pay | Admitting: Emergency Medicine

## 2019-02-20 ENCOUNTER — Other Ambulatory Visit: Payer: Self-pay

## 2019-02-20 DIAGNOSIS — Z95810 Presence of automatic (implantable) cardiac defibrillator: Secondary | ICD-10-CM

## 2019-02-20 DIAGNOSIS — I200 Unstable angina: Secondary | ICD-10-CM | POA: Diagnosis not present

## 2019-02-20 DIAGNOSIS — I214 Non-ST elevation (NSTEMI) myocardial infarction: Secondary | ICD-10-CM | POA: Diagnosis not present

## 2019-02-20 DIAGNOSIS — I249 Acute ischemic heart disease, unspecified: Secondary | ICD-10-CM | POA: Diagnosis not present

## 2019-02-20 DIAGNOSIS — Z885 Allergy status to narcotic agent status: Secondary | ICD-10-CM

## 2019-02-20 DIAGNOSIS — Z8249 Family history of ischemic heart disease and other diseases of the circulatory system: Secondary | ICD-10-CM

## 2019-02-20 DIAGNOSIS — F419 Anxiety disorder, unspecified: Secondary | ICD-10-CM | POA: Diagnosis not present

## 2019-02-20 DIAGNOSIS — I110 Hypertensive heart disease with heart failure: Secondary | ICD-10-CM | POA: Diagnosis present

## 2019-02-20 DIAGNOSIS — I25119 Atherosclerotic heart disease of native coronary artery with unspecified angina pectoris: Secondary | ICD-10-CM | POA: Diagnosis not present

## 2019-02-20 DIAGNOSIS — N179 Acute kidney failure, unspecified: Secondary | ICD-10-CM | POA: Diagnosis present

## 2019-02-20 DIAGNOSIS — Z955 Presence of coronary angioplasty implant and graft: Secondary | ICD-10-CM

## 2019-02-20 DIAGNOSIS — R079 Chest pain, unspecified: Secondary | ICD-10-CM

## 2019-02-20 DIAGNOSIS — I472 Ventricular tachycardia: Secondary | ICD-10-CM | POA: Diagnosis not present

## 2019-02-20 DIAGNOSIS — M109 Gout, unspecified: Secondary | ICD-10-CM | POA: Diagnosis present

## 2019-02-20 DIAGNOSIS — F5105 Insomnia due to other mental disorder: Secondary | ICD-10-CM | POA: Diagnosis present

## 2019-02-20 DIAGNOSIS — R42 Dizziness and giddiness: Secondary | ICD-10-CM | POA: Diagnosis not present

## 2019-02-20 DIAGNOSIS — Z8042 Family history of malignant neoplasm of prostate: Secondary | ICD-10-CM

## 2019-02-20 DIAGNOSIS — R001 Bradycardia, unspecified: Secondary | ICD-10-CM | POA: Diagnosis present

## 2019-02-20 DIAGNOSIS — Z79899 Other long term (current) drug therapy: Secondary | ICD-10-CM

## 2019-02-20 DIAGNOSIS — E785 Hyperlipidemia, unspecified: Secondary | ICD-10-CM | POA: Diagnosis present

## 2019-02-20 DIAGNOSIS — F410 Panic disorder [episodic paroxysmal anxiety] without agoraphobia: Secondary | ICD-10-CM | POA: Diagnosis present

## 2019-02-20 DIAGNOSIS — I252 Old myocardial infarction: Secondary | ICD-10-CM

## 2019-02-20 DIAGNOSIS — Z9861 Coronary angioplasty status: Secondary | ICD-10-CM

## 2019-02-20 DIAGNOSIS — I255 Ischemic cardiomyopathy: Secondary | ICD-10-CM | POA: Diagnosis present

## 2019-02-20 DIAGNOSIS — I10 Essential (primary) hypertension: Secondary | ICD-10-CM | POA: Diagnosis present

## 2019-02-20 DIAGNOSIS — Z8711 Personal history of peptic ulcer disease: Secondary | ICD-10-CM

## 2019-02-20 DIAGNOSIS — D696 Thrombocytopenia, unspecified: Secondary | ICD-10-CM | POA: Diagnosis present

## 2019-02-20 DIAGNOSIS — Z8052 Family history of malignant neoplasm of bladder: Secondary | ICD-10-CM

## 2019-02-20 DIAGNOSIS — D329 Benign neoplasm of meninges, unspecified: Secondary | ICD-10-CM

## 2019-02-20 DIAGNOSIS — I2510 Atherosclerotic heart disease of native coronary artery without angina pectoris: Secondary | ICD-10-CM | POA: Diagnosis present

## 2019-02-20 DIAGNOSIS — K219 Gastro-esophageal reflux disease without esophagitis: Secondary | ICD-10-CM | POA: Diagnosis present

## 2019-02-20 DIAGNOSIS — Z7982 Long term (current) use of aspirin: Secondary | ICD-10-CM

## 2019-02-20 DIAGNOSIS — F40240 Claustrophobia: Secondary | ICD-10-CM | POA: Diagnosis present

## 2019-02-20 DIAGNOSIS — I5022 Chronic systolic (congestive) heart failure: Secondary | ICD-10-CM | POA: Diagnosis present

## 2019-02-20 LAB — BASIC METABOLIC PANEL
Anion gap: 10 (ref 5–15)
BUN: 15 mg/dL (ref 8–23)
CO2: 23 mmol/L (ref 22–32)
Calcium: 8.5 mg/dL — ABNORMAL LOW (ref 8.9–10.3)
Chloride: 107 mmol/L (ref 98–111)
Creatinine, Ser: 1.55 mg/dL — ABNORMAL HIGH (ref 0.61–1.24)
GFR calc Af Amer: 47 mL/min — ABNORMAL LOW (ref >60)
GFR calc non Af Amer: 41 mL/min — ABNORMAL LOW (ref >60)
Glucose, Bld: 120 mg/dL — ABNORMAL HIGH (ref 70–99)
Potassium: 4.6 mmol/L (ref 3.5–5.1)
Sodium: 140 mmol/L (ref 135–145)

## 2019-02-20 LAB — CBC
HCT: 44.9 % (ref 39.0–52.0)
Hemoglobin: 14.7 g/dL (ref 13.0–17.0)
MCH: 29.4 pg (ref 26.0–34.0)
MCHC: 32.7 g/dL (ref 30.0–36.0)
MCV: 89.8 fL (ref 80.0–100.0)
Platelets: 138 10*3/uL — ABNORMAL LOW (ref 150–400)
RBC: 5 MIL/uL (ref 4.22–5.81)
RDW: 13.2 % (ref 11.5–15.5)
WBC: 5.9 10*3/uL (ref 4.0–10.5)
nRBC: 0 % (ref 0.0–0.2)

## 2019-02-20 LAB — I-STAT TROPONIN, ED: Troponin i, poc: 0.01 ng/mL (ref 0.00–0.08)

## 2019-02-20 LAB — TROPONIN I: Troponin I: 1.97 ng/mL (ref <0.03)

## 2019-02-20 MED ORDER — BENAZEPRIL HCL 10 MG PO TABS
10.0000 mg | ORAL_TABLET | Freq: Every day | ORAL | Status: DC
  Administered 2019-02-21 – 2019-02-22 (×3): 10 mg via ORAL
  Filled 2019-02-20 (×3): qty 1

## 2019-02-20 MED ORDER — SODIUM CHLORIDE 0.9 % IV SOLN: 250.0000 mL | INTRAVENOUS | Status: DC | PRN

## 2019-02-20 MED ORDER — ACETAMINOPHEN 650 MG RE SUPP: 650.0000 mg | Freq: Four times a day (QID) | RECTAL | Status: DC | PRN

## 2019-02-20 MED ORDER — SODIUM CHLORIDE 0.9% FLUSH: 3.0000 mL | Freq: Two times a day (BID) | INTRAVENOUS | Status: DC

## 2019-02-20 MED ORDER — SODIUM CHLORIDE 0.9% FLUSH: 3.0000 mL | INTRAVENOUS | Status: DC | PRN

## 2019-02-20 MED ORDER — SODIUM CHLORIDE 0.9 % IV SOLN: INTRAVENOUS | Status: DC

## 2019-02-20 MED ORDER — ACETAMINOPHEN 325 MG PO TABS: 650.0000 mg | ORAL_TABLET | Freq: Four times a day (QID) | ORAL | Status: DC | PRN

## 2019-02-20 MED ORDER — HEPARIN BOLUS VIA INFUSION
4000.0000 [IU] | Freq: Once | INTRAVENOUS | Status: AC
  Administered 2019-02-20: 18:00:00 4000 [IU] via INTRAVENOUS
  Filled 2019-02-20: qty 4000

## 2019-02-20 MED ORDER — LORAZEPAM 0.5 MG PO TABS
0.5000 mg | ORAL_TABLET | Freq: Every day | ORAL | Status: DC | PRN
  Administered 2019-02-20: 23:00:00 0.5 mg via ORAL
  Administered 2019-02-21 – 2019-02-22 (×3): 1 mg via ORAL
  Filled 2019-02-20: qty 1
  Filled 2019-02-20 (×2): qty 2

## 2019-02-20 MED ORDER — CARVEDILOL 12.5 MG PO TABS
12.5000 mg | ORAL_TABLET | Freq: Two times a day (BID) | ORAL | Status: DC
  Administered 2019-02-21 – 2019-02-23 (×4): 12.5 mg via ORAL
  Filled 2019-02-20 (×4): qty 1

## 2019-02-20 MED ORDER — SIMVASTATIN 20 MG PO TABS
40.0000 mg | ORAL_TABLET | Freq: Every day | ORAL | Status: DC
  Administered 2019-02-21 – 2019-02-22 (×2): 40 mg via ORAL
  Filled 2019-02-20 (×3): qty 2

## 2019-02-20 MED ORDER — FUROSEMIDE 20 MG PO TABS
10.0000 mg | ORAL_TABLET | Freq: Every day | ORAL | Status: DC
  Administered 2019-02-21: 10 mg via ORAL
  Filled 2019-02-20: qty 1

## 2019-02-20 MED ORDER — HEPARIN (PORCINE) 25000 UT/250ML-% IV SOLN
950.0000 [IU]/h | INTRAVENOUS | Status: DC
  Administered 2019-02-20 – 2019-02-21 (×2): 950 [IU]/h via INTRAVENOUS
  Filled 2019-02-20 (×2): qty 250

## 2019-02-20 MED ORDER — ASPIRIN 325 MG PO TABS
325.0000 mg | ORAL_TABLET | Freq: Every day | ORAL | Status: DC
  Administered 2019-02-21 – 2019-02-22 (×2): 325 mg via ORAL
  Filled 2019-02-20 (×2): qty 1

## 2019-02-20 NOTE — Consult Note (Signed)
Cardiology Consult    Patient ID: EDAHI KROENING MRN: [id number], DOB/AGE: [id number]   Admit date: 02/20/2019 Date of Consult: 02/20/2019  Primary Physician: Kelton Pillar, MD Primary Cardiologist: Kirk Ruths, MD   Patient Profile    Adrian Pace is a 85 y.o. male with a history of CAD. The patient had his last cardiac catheterization in 2003. He had PCI of his LAD and right coronary artery. His ejection fraction was 30%. Myoview in Sept of 2012 showed EF 22, anteroapical infarct; no ischemia. Patient had ICD system revision in Jan 2013.   He presents with new onset of Chest pressure today. He was eating dinner when he developed dizziness. Went to bathroom and felt dizzy.  He had to sit down.  EMS was called.  He was found to be having a heart rate in 40s.  With blood pressure in the 80s.  When he started complaining of a chest pressure.  He has not had chest pain or chest pressure like that for years.  Last catheterization was in 2013.  He is an active gentleman.  Denies any dyspnea on exertion, leg edema.  Or PND.  No change in his meds.  He took his morning meds.  Past Medical History   Past Medical History:  Diagnosis Date  . Anxiety    Takes Ativan as needed  . Cardiac dysrhythmia, unspecified   . CHF (congestive heart failure) ([place])   . Coronary artery disease    Hx of cardiac caths   . Enlarged prostate   . GERD (gastroesophageal reflux disease)    Takes prilosec  . Gout   . Heart attack ([place])   . HTN (hypertension)   . Hyperkalemia   . Hyperlipemia   . Ischemic cardiomyopathy   . LV dysfunction   . Myocardial infarct ([place]) 1988  . Panic attacks   . Personal history of peptic ulcer disease   . Renal insufficiency   . Syncope and collapse   . Ventricular tachycardia [facility])     Past Surgical History:  Procedure Laterality Date  . CARDIAC CATHETERIZATION  2003  . CARDIAC DEFIBRILLATOR PLACEMENT    . COLONOSCOPY    . CORONARY ANGIOPLASTY    .  ESOPHAGOGASTRODUODENOSCOPY     with biopsy  . HERNIA REPAIR  01/01/2012  . INSERT / REPLACE / REMOVE PACEMAKER  2005   ICD; replaced in Jan 2013  . LEAD REVISION N/A 11/08/2011   Procedure: LEAD REVISION;  Surgeon: Thompson Grayer, MD;  Location: [facility] CATH LAB;  Service: Cardiovascular;  Laterality: N/A;  . UMBILICAL HERNIA REPAIR    . UMBILICAL HERNIA REPAIR  01/01/2012   Procedure: HERNIA REPAIR UMBILICAL ADULT;  Surgeon: Merrie Roof, MD;  Location: [place];  Service: General;  Laterality: N/A;  umbilical hernia repair with mesh     Allergies  Allergies  Allergen Reactions  . Codeine Anxiety      Family History    Family History  Problem Relation Age of Onset  . Heart attack Father        x2  . Heart disease Father   . Cancer Father        bladder and prostate  . Anesthesia problems Neg Hx   . Hypotension Neg Hx   . Malignant hyperthermia Neg Hx   . Pseudochol deficiency Neg Hx    He indicated that his mother is deceased. He indicated that his father is deceased. He indicated that his maternal grandmother is deceased.  He indicated that his maternal grandfather is deceased. He indicated that his paternal grandmother is deceased. He indicated that his paternal grandfather is deceased. He indicated that the status of his neg hx is unknown.   Social History    Social History   Socioeconomic History  . Marital status: Married    Spouse name: Not on file  . Number of children: Not on file  . Years of education: Not on file  . Highest education level: Not on file  Occupational History  . Not on file  Social Needs  . Financial resource strain: Not on file  . Food insecurity:    Worry: Not on file    Inability: Not on file  . Transportation needs:    Medical: Not on file    Non-medical: Not on file  Tobacco Use  . Smoking status: Never Smoker  . Smokeless tobacco: Never Used  Substance and Sexual Activity  . Alcohol use: Yes    Comment: occassionally  . Drug use: No   . Sexual activity: Not Currently  Lifestyle  . Physical activity:    Days per week: Not on file    Minutes per session: Not on file  . Stress: Not on file  Relationships  . Social connections:    Talks on phone: Not on file    Gets together: Not on file    Attends religious service: Not on file    Active member of club or organization: Not on file    Attends meetings of clubs or organizations: Not on file    Relationship status: Not on file  . Intimate partner violence:    Fear of current or ex partner: Not on file    Emotionally abused: Not on file    Physically abused: Not on file    Forced sexual activity: Not on file  Other Topics Concern  . Not on file  Social History Narrative  . Not on file     Review of Systems    General:  No chills, fever, night sweats or weight changes.  Cardiovascular:  No chest pain, dyspnea on exertion, edema, orthopnea, palpitations, paroxysmal nocturnal dyspnea. Dermatological: No rash, lesions/masses Respiratory: No cough, dyspnea Urologic: No hematuria, dysuria Abdominal:   No nausea, vomiting, diarrhea, bright red blood per rectum, melena, or hematemesis Neurologic:  No visual changes, wkns, changes in mental status. All other systems reviewed and are otherwise negative except as noted above.  Physical Exam    Blood pressure 114/65, pulse 75, temperature (!) 97.5 F (36.4 C), temperature source Oral, resp. rate 17, height 5\' 8"  (1.727 m), weight 78 kg, SpO2 98 %.  General: Pleasant, NAD Psych: Normal affect. Neuro: Alert and oriented X 3. Moves all extremities spontaneously. HEENT: Normal  Neck: Supple without bruits or JVD. Lungs:  Resp regular and unlabored, CTA. Heart: RRR no s3, s4, or murmurs. Abdomen: Soft, non-tender, non-distended, BS + x 4.  Extremities: No clubbing, cyanosis or edema. DP/PT/Radials 2+ and equal bilaterally.  Labs    Troponin [facility] of Care Test) Recent Labs    02/20/19 1724  TROPIPOC 0.01   No  results for input(s): CKTOTAL, CKMB, TROPONINI in the last 72 hours. Lab Results  Component Value Date   WBC 6.8 12/25/2011   HGB 15.4 12/25/2011   HCT 45.5 12/25/2011   MCV 88.3 12/25/2011   PLT 175 12/25/2011    Recent Labs  Lab 02/20/19 1721  NA 140  K 4.6  CL 107  CO2  23  BUN 15  CREATININE 1.55*  CALCIUM 8.5*  GLUCOSE 120*   Lab Results  Component Value Date   CHOL 102 07/09/2011   HDL 31.10 (L) 07/09/2011   LDLCALC 56 07/09/2011   TRIG 76.0 07/09/2011   No results found for: [facility]   Radiology Studies    Dg Chest Port 1 View  Result Date: 02/20/2019 CLINICAL DATA:  Chest pain EXAM: PORTABLE CHEST 1 VIEW COMPARISON:  11/09/2011 FINDINGS: Left subclavian AICD is stable. Normal heart size. Low lung volumes. Bibasilar atelectasis. No pneumothorax. No pleural effusion. IMPRESSION: Bibasilar atelectasis. Electronically Signed   By: Marybelle Killings M.D.   On: 02/20/2019 17:47    ECG & Cardiac Imaging    Dynamic changes on EKG in lead V1 and V2 when comparing EMS to first ED ECG.  He had negative T waves/flat T wave in the first ECG which then improved on subsequent 1.  Assessment & Plan    Unstable angina.  He is pain-free.  Initial troponin is negative.  He is euvolemic on physical exam.  Not fully sure whether that was a trigger for his hypertension which provoked the chest pain like symptoms.  His bradycardia and hypotension seems to be out of proportion with a primary coronary lesion that self resolves that quickly.  Recommendations: -Trend biomarkers -TTE in the morning -Please interrogate the defibrillator it is a [employer] device.  Typically devices is set to a slightly higher backup pacing rate.   Signed, Cristina Gong, MD 02/20/2019, 6:21 PM  For questions or updates, please contact   Please consult [email or web address] for contact info under Cardiology/STEMI.

## 2019-02-20 NOTE — ED Triage Notes (Signed)
Pt arrives to ED from home with complaints of sudden chest pain that led to a near syncopal episode while sitting down and watching tv. EMS reports pt felt nauseous and got sweaty before the episode. On EMS arrival pt was found to be brady cardiac and hypotensive. HR 48, BP 70/42. Pt received 324 ASA.

## 2019-02-20 NOTE — ED Notes (Signed)
[employer] has called and stated there was no arrhythmias found today

## 2019-02-20 NOTE — Progress Notes (Addendum)
ANTICOAGULATION CONSULT NOTE - Initial Consult  Pharmacy Consult for heparin Indication: chest pain/ACS  Allergies  Allergen Reactions  . Codeine Anxiety    Patient Measurements: Height: 5\' 8"  (172.7 cm) Weight: 172 lb (78 kg) IBW/kg (Calculated) : 68.4 Heparin Dosing Weight: 78 kg  Vital Signs: Temp: 97.5 F (36.4 C) (04/25 1718) Temp Source: Oral (04/25 1718) BP: 114/65 (04/25 1718) Pulse Rate: 75 (04/25 1718)  Labs: Recent Labs    02/20/19 1721  HGB 14.7  HCT 44.9  PLT 138*  CREATININE 1.55*    Estimated Creatinine Clearance: 34.3 mL/min (A) (by C-G formula based on SCr of 1.55 mg/dL (H)).   Medical History: Past Medical History:  Diagnosis Date  . Anxiety    Takes Ativan as needed  . Cardiac dysrhythmia, unspecified   . CHF (congestive heart failure) ([place])   . Coronary artery disease    Hx of cardiac caths   . Enlarged prostate   . GERD (gastroesophageal reflux disease)    Takes prilosec  . Gout   . Heart attack ([place])   . HTN (hypertension)   . Hyperkalemia   . Hyperlipemia   . Ischemic cardiomyopathy   . LV dysfunction   . Myocardial infarct ([place]) 1988  . Panic attacks   . Personal history of peptic ulcer disease   . Renal insufficiency   . Syncope and collapse   . Ventricular tachycardia [facility])     Assessment: 36 yoM admitted 4/25 with complaints of chest pain. Pharmacy has been consulted for heparin dosing. Patient not on anticoagulation PTA. Baseline Hgb 14.7, HCT 44.9, pltc 138. On admit patient's Scr 1.55.  Goal of Therapy:  Heparin level 0.3-0.7 units/ml Monitor platelets by anticoagulation protocol: Yes   Plan:  Give 4000 units bolus x 1 Start heparin infusion at 950 units/hr Check anti-Xa level in 8 hours and daily while on heparin Continue to monitor H&H and platelets  Thank you for allowing pharmacy to be a part of this patient's care.  Leron Croak, PharmD PGY1 Pharmacy Resident  02/20/2019,5:20 PM

## 2019-02-20 NOTE — Progress Notes (Signed)
Troponin 1.97 pt is asymptomatic. Notified on call Cardiology and will continue to monitor patient.

## 2019-02-20 NOTE — ED Provider Notes (Signed)
[facility] EMERGENCY DEPARTMENT Provider Note   CSN: [id number] Arrival date & time: 02/20/19  1706    History   Chief Complaint Chief Complaint  Patient presents with   Chest Pain   Near Syncope    HPI Adrian Pace is a 85 y.o. male.     Patient c/o acute onset chest pressure, diaphoresis, feeling faint, this afternoon approximately 1 hr ago. Was at rest, sitting at time of onset symptoms. Symptoms were acute onset, moderate, persistent, non radiating, not pleuritic. No sob. No vomiting. EMS was called, gave ASA. EMS notes initially hr in upper 40s and bp low, 70-80. In route, chest pain resolved. Pt currently denies chest discomfort, no diaphoresis, no faintness. Denies any other recent chest pain or discomfort. No unusual doe. No recent cough or uri symptoms. No fever or chills.   The history is provided by the patient and the EMS personnel.  Chest Pain  Associated symptoms: diaphoresis and near-syncope   Associated symptoms: no abdominal pain, no back pain, no cough, no fever, no headache, no palpitations, no shortness of breath and no vomiting   Near Syncope  Associated symptoms include chest pain. Pertinent negatives include no abdominal pain, no headaches and no shortness of breath.    Past Medical History:  Diagnosis Date   Anxiety    Takes Ativan as needed   Cardiac dysrhythmia, unspecified    CHF (congestive heart failure) (HCC)    Coronary artery disease    Hx of cardiac caths    Enlarged prostate    GERD (gastroesophageal reflux disease)    Takes prilosec   Gout    Heart attack ([place])    HTN (hypertension)    Hyperkalemia    Hyperlipemia    Ischemic cardiomyopathy    LV dysfunction    Myocardial infarct ([place]) 1988   Panic attacks    Personal history of peptic ulcer disease    Renal insufficiency    Syncope and collapse    Ventricular tachycardia [facility])     Patient Active Problem List   Diagnosis Date Noted    Ventricular tachycardia ([place])    Personal history of peptic ulcer disease    Heart attack ([place])    GERD (gastroesophageal reflux disease)    Enlarged prostate    Coronary artery disease    CHF (congestive heart failure) ([place])    Bruit 07/19/2013   Automatic implantable cardioverter-defibrillator in situ 13/24/4010   Umbilical hernia 27/25/3664   CAD (coronary artery disease) 07/09/2011   Ischemic cardiomyopathy    Renal insufficiency    Hyperlipemia    HTN (hypertension)    LV dysfunction    Anxiety    Panic attacks    Hyperkalemia    DYSLIPIDEMIA 04/11/2009   Essential hypertension 04/11/2009   VENTRICULAR TACHYCARDIA 04/11/2009   CHF 04/11/2009   SYNCOPE 04/11/2009   Myocardial infarct ([place]) 10/28/1986    Past Surgical History:  Procedure Laterality Date   CARDIAC CATHETERIZATION  2003   CARDIAC DEFIBRILLATOR PLACEMENT     COLONOSCOPY     CORONARY ANGIOPLASTY     ESOPHAGOGASTRODUODENOSCOPY     with biopsy   HERNIA REPAIR  01/01/2012   INSERT / REPLACE / REMOVE PACEMAKER  2005   ICD; replaced in Jan 2013   LEAD REVISION N/A 11/08/2011   Procedure: LEAD REVISION;  Surgeon: Thompson Grayer, MD;  Location: [facility] CATH LAB;  Service: Cardiovascular;  Laterality: N/A;   UMBILICAL HERNIA REPAIR     UMBILICAL  HERNIA REPAIR  01/01/2012   Procedure: HERNIA REPAIR UMBILICAL ADULT;  Surgeon: Merrie Roof, MD;  Location: [place];  Service: General;  Laterality: N/A;  umbilical hernia repair with mesh        Home Medications    Prior to Admission medications   Medication Sig Start Date End Date Taking? Authorizing Provider  aspirin 325 MG tablet Take 325 mg by mouth daily.      [provider]  benazepril (LOTENSIN) 10 MG tablet TAKE ONE TABLET BY MOUTH DAILY 06/01/18   Lelon Perla, MD  carvedilol (COREG) 25 MG tablet TAKE ONE TABLET BY MOUTH TWICE A DAY WITH A MEAL 02/11/19   Evans Lance, MD  finasteride (PROSCAR) 5 MG tablet  Take 5 mg by mouth daily.    [provider]  LORazepam (ATIVAN) 0.5 MG tablet Take 1 tablet by mouth daily as needed for anxiety.  12/08/13   [provider]  simvastatin (ZOCOR) 40 MG tablet TAKE ONE TABLET BY MOUTH DAILY AT 6PM 10/12/18   Lelon Perla, MD    Family History Family History  Problem Relation Age of Onset   Heart attack Father        x2   Heart disease Father    Cancer Father        bladder and prostate   Anesthesia problems Neg Hx    Hypotension Neg Hx    Malignant hyperthermia Neg Hx    Pseudochol deficiency Neg Hx     Social History Social History   Tobacco Use   Smoking status: Never Smoker   Smokeless tobacco: Never Used  Substance Use Topics   Alcohol use: Yes    Comment: occassionally   Drug use: No     Allergies   Codeine   Review of Systems Review of Systems  Constitutional: Positive for diaphoresis. Negative for fever.  HENT: Negative for sore throat.   Eyes: Negative for redness.  Respiratory: Negative for cough and shortness of breath.   Cardiovascular: Positive for chest pain and near-syncope. Negative for palpitations and leg swelling.  Gastrointestinal: Negative for abdominal pain and vomiting.  Endocrine: Negative for polyuria.  Genitourinary: Negative for dysuria and flank pain.  Musculoskeletal: Negative for back pain and neck pain.  Skin: Negative for rash.  Neurological: Positive for light-headedness. Negative for headaches.  Hematological: Does not bruise/bleed easily.  Psychiatric/Behavioral: Negative for confusion.     Physical Exam Updated Vital Signs BP 114/65 (BP Location: Right Arm)    Pulse 75    Temp (!) 97.5 F (36.4 C) (Oral)    Resp 17    Ht 1.727 m (5\' 8" )    Wt 78 kg    SpO2 98%    BMI 26.15 kg/m   Physical Exam Vitals signs and nursing note reviewed.  Constitutional:      Appearance: Normal appearance. He is well-developed.  HENT:     Head: Atraumatic.     Nose: Nose  normal.     Mouth/Throat:     Mouth: Mucous membranes are moist.     Pharynx: Oropharynx is clear.  Eyes:     General: No scleral icterus.    Conjunctiva/sclera: Conjunctivae normal.  Neck:     Musculoskeletal: Normal range of motion and neck supple. No neck rigidity.     Trachea: No tracheal deviation.  Cardiovascular:     Rate and Rhythm: Normal rate and regular rhythm.     Pulses: Normal pulses.  Heart sounds: Normal heart sounds. No murmur. No friction rub. No gallop.   Pulmonary:     Effort: Pulmonary effort is normal. No accessory muscle usage or respiratory distress.     Breath sounds: Normal breath sounds.  Abdominal:     General: Bowel sounds are normal. There is no distension.     Palpations: Abdomen is soft.     Tenderness: There is no abdominal tenderness. There is no guarding.  Genitourinary:    Comments: No cva tenderness. Musculoskeletal:        General: No swelling.     Right lower leg: No edema.     Left lower leg: No edema.  Skin:    General: Skin is warm and dry.     Findings: No rash.  Neurological:     Mental Status: He is alert.     Comments: Alert, speech clear.   Psychiatric:        Mood and Affect: Mood normal.      ED Treatments / Results  Labs (all labs ordered are listed, but only abnormal results are displayed) Results for orders placed or performed during the hospital encounter of 02/20/19  CBC  Result Value Ref Range   WBC 5.9 4.0 - 10.5 K/uL   RBC 5.00 4.22 - 5.81 MIL/uL   Hemoglobin 14.7 13.0 - 17.0 g/dL   HCT 44.9 39.0 - 52.0 %   MCV 89.8 80.0 - 100.0 fL   MCH 29.4 26.0 - 34.0 pg   MCHC 32.7 30.0 - 36.0 g/dL   RDW 13.2 11.5 - 15.5 %   Platelets 138 (L) 150 - 400 K/uL   nRBC 0.0 0.0 - 0.2 %  Basic metabolic panel  Result Value Ref Range   Sodium 140 135 - 145 mmol/L   Potassium 4.6 3.5 - 5.1 mmol/L   Chloride 107 98 - 111 mmol/L   CO2 23 22 - 32 mmol/L   Glucose, Bld 120 (H) 70 - 99 mg/dL   BUN 15 8 - 23 mg/dL    Creatinine, Ser 1.55 (H) 0.61 - 1.24 mg/dL   Calcium 8.5 (L) 8.9 - 10.3 mg/dL   GFR calc non Af Amer 41 (L) >60 mL/min   GFR calc Af Amer 47 (L) >60 mL/min   Anion gap 10 5 - 15  Heparin level (unfractionated)  Result Value Ref Range   Heparin Unfractionated 0.41 0.30 - 0.70 IU/mL  CBC  Result Value Ref Range   WBC 8.0 4.0 - 10.5 K/uL   RBC 4.99 4.22 - 5.81 MIL/uL   Hemoglobin 14.6 13.0 - 17.0 g/dL   HCT 43.7 39.0 - 52.0 %   MCV 87.6 80.0 - 100.0 fL   MCH 29.3 26.0 - 34.0 pg   MCHC 33.4 30.0 - 36.0 g/dL   RDW 13.3 11.5 - 15.5 %   Platelets 135 (L) 150 - 400 K/uL   nRBC 0.0 0.0 - 0.2 %  Comprehensive metabolic panel  Result Value Ref Range   Sodium 141 135 - 145 mmol/L   Potassium 4.2 3.5 - 5.1 mmol/L   Chloride 111 98 - 111 mmol/L   CO2 23 22 - 32 mmol/L   Glucose, Bld 113 (H) 70 - 99 mg/dL   BUN 15 8 - 23 mg/dL   Creatinine, Ser 1.34 (H) 0.61 - 1.24 mg/dL   Calcium 8.7 (L) 8.9 - 10.3 mg/dL   Total Protein 5.9 (L) 6.5 - 8.1 g/dL   Albumin 3.3 (L) 3.5 - 5.0 g/dL  AST 53 (H) 15 - 41 U/L   ALT 21 0 - 44 U/L   Alkaline Phosphatase 70 38 - 126 U/L   Total Bilirubin 0.6 0.3 - 1.2 mg/dL   GFR calc non Af Amer 48 (L) >60 mL/min   GFR calc Af Amer 56 (L) >60 mL/min   Anion gap 7 5 - 15  TSH  Result Value Ref Range   TSH 0.534 0.350 - 4.500 uIU/mL  Hemoglobin A1c  Result Value Ref Range   Hgb A1c MFr Bld 5.8 (H) 4.8 - 5.6 %   Mean Plasma Glucose 119.76 mg/dL  Troponin I - Now Then Q6H  Result Value Ref Range   Troponin I 1.97 (HH) <0.03 ng/mL  Troponin I - Now Then Q6H  Result Value Ref Range   Troponin I 6.79 (HH) <0.03 ng/mL  Troponin I - Now Then Q6H  Result Value Ref Range   Troponin I 9.62 (HH) <0.03 ng/mL  Lipid panel  Result Value Ref Range   Cholesterol 95 0 - 200 mg/dL   Triglycerides 83 <150 mg/dL   HDL 28 (L) >40 mg/dL   Total CHOL/HDL Ratio 3.4 RATIO   VLDL 17 0 - 40 mg/dL   LDL Cholesterol 50 0 - 99 mg/dL  Heparin level (unfractionated)  Result  Value Ref Range   Heparin Unfractionated 0.44 0.30 - 0.70 IU/mL  I-stat troponin, ED  Result Value Ref Range   Troponin i, poc 0.01 0.00 - 0.08 ng/mL   Comment 3          ECHOCARDIOGRAM LIMITED  Result Value Ref Range   Weight 2,801.6 oz   Height 68 in   BP 129/73 mmHg   Ct Head Wo Contrast  Result Date: 02/20/2019 CLINICAL DATA:  85 year old with a near syncopal episode while watching television earlier this evening. EXAM: CT HEAD WITHOUT CONTRAST TECHNIQUE: Contiguous axial images were obtained from the base of the skull through the vertex without intravenous contrast. COMPARISON:  None. FINDINGS: Brain: Mild age related cortical atrophy. Asymmetry in the size of the LATERAL ventricles, RIGHT greater than LEFT, is felt to be developmental. No mass lesion. No midline shift. No acute hemorrhage or hematoma. No extra-axial fluid collections. No evidence of acute infarction. Vascular: Moderate BILATERAL carotid siphon and BILATERAL vertebral artery atherosclerosis. No hyperdense vessel. Skull: No skull fracture or other focal osseous abnormality involving the skull. Sinuses/Orbits: Visualized paranasal sinuses, bilateral mastoid air cells and bilateral middle ear cavities well-aerated. Frontal sinuses are hypoplastic. Visualized orbits and globes normal in appearance. Other: Approximate 1.0 x 0.9 cm partially calcified meningioma arising from the floor of the RIGHT ANTERIOR cranial fossa. IMPRESSION: 1. No acute intracranial abnormality. 2. Mild age related cortical atrophy. 3. Incidental 1.0 cm partially calcified meningioma arising from the floor of the RIGHT ANTERIOR cranial fossa. Electronically Signed   By: Evangeline Dakin M.D.   On: 02/20/2019 19:29   Dg Chest Port 1 View  Result Date: 02/20/2019 CLINICAL DATA:  Chest pain EXAM: PORTABLE CHEST 1 VIEW COMPARISON:  11/09/2011 FINDINGS: Left subclavian AICD is stable. Normal heart size. Low lung volumes. Bibasilar atelectasis. No pneumothorax. No  pleural effusion. IMPRESSION: Bibasilar atelectasis. Electronically Signed   By: Marybelle Killings M.D.   On: 02/20/2019 17:47   Vas US Carotid  Result Date: 02/21/2019 Carotid Arterial Duplex Study Indications:       Syncope and Dizziness. Risk Factors:      Hypertension, hyperlipidemia, coronary artery disease. Comparison Study:  No prior study on  file for comparison Performing Technologist: Sharion Dove RVS  Examination Guidelines: A complete evaluation includes B-mode imaging, spectral Doppler, color Doppler, and power Doppler as needed of all accessible portions of each vessel. Bilateral testing is considered an integral part of a complete examination. Limited examinations for reoccurring indications may be performed as noted.  Right Carotid Findings: +----------+--------+--------+--------+--------+------------------+             PSV cm/s EDV cm/s Stenosis Describe Comments            +----------+--------+--------+--------+--------+------------------+  CCA Prox   80       18                         intimal thickening  +----------+--------+--------+--------+--------+------------------+  CCA Distal 78       17                         intimal thickening  +----------+--------+--------+--------+--------+------------------+  ICA Prox   65       14                                             +----------+--------+--------+--------+--------+------------------+  ICA Distal 51       15                                             +----------+--------+--------+--------+--------+------------------+  ECA        60       2                                              +----------+--------+--------+--------+--------+------------------+ +----------+--------+-------+--------+-------------------+             PSV cm/s EDV cms Describe Arm Pressure (mmHG)  +----------+--------+-------+--------+-------------------+  Subclavian 47                                             +----------+--------+-------+--------+-------------------+  +---------+--------+--+--------+--+  Vertebral PSV cm/s 46 EDV cm/s 16  +---------+--------+--+--------+--+  Left Carotid Findings: +----------+--------+--------+--------+-----------+--------+             PSV cm/s EDV cm/s Stenosis Describe    Comments  +----------+--------+--------+--------+-----------+--------+  CCA Prox   80       16                                      +----------+--------+--------+--------+-----------+--------+  CCA Distal 88       20                                      +----------+--------+--------+--------+-----------+--------+  ICA Prox   46       9                 homogeneous           +----------+--------+--------+--------+-----------+--------+  ICA Distal 49       14                                      +----------+--------+--------+--------+-----------+--------+  ECA        58       5                                       +----------+--------+--------+--------+-----------+--------+ +----------+--------+--------+--------+-------------------+  Subclavian PSV cm/s EDV cm/s Describe Arm Pressure (mmHG)  +----------+--------+--------+--------+-------------------+             55                                              +----------+--------+--------+--------+-------------------+ +---------+--------+--+--------+--+  Vertebral PSV cm/s 60 EDV cm/s 11  +---------+--------+--+--------+--+  Summary: Right Carotid: The extracranial vessels were near-normal with only minimal wall                thickening or plaque. Left Carotid: The extracranial vessels were near-normal with only minimal wall               thickening or plaque. Vertebrals:  Bilateral vertebral arteries demonstrate antegrade flow. Subclavians: Normal flow hemodynamics were seen in bilateral subclavian              arteries. *See table(s) above for measurements and observations.  Electronically signed by Monica Martinez MD on 02/21/2019 at 11:55:11 AM.    Final     EKG EKG Interpretation  Date/Time:  Saturday February 20 2019  17:11:00 EDT Ventricular Rate:  72 PR Interval:    QRS Duration: 124 QT Interval:  417 QTC Calculation: 457 R Axis:   -67 Text Interpretation:  Sinus rhythm RBBB and LAFB Nonspecific ST abnormality Confirmed by Lajean Saver [phone]) on 02/20/2019 5:14:34 PM   Radiology Ct Head Wo Contrast  Result Date: 02/20/2019 CLINICAL DATA:  85 year old with a near syncopal episode while watching television earlier this evening. EXAM: CT HEAD WITHOUT CONTRAST TECHNIQUE: Contiguous axial images were obtained from the base of the skull through the vertex without intravenous contrast. COMPARISON:  None. FINDINGS: Brain: Mild age related cortical atrophy. Asymmetry in the size of the LATERAL ventricles, RIGHT greater than LEFT, is felt to be developmental. No mass lesion. No midline shift. No acute hemorrhage or hematoma. No extra-axial fluid collections. No evidence of acute infarction. Vascular: Moderate BILATERAL carotid siphon and BILATERAL vertebral artery atherosclerosis. No hyperdense vessel. Skull: No skull fracture or other focal osseous abnormality involving the skull. Sinuses/Orbits: Visualized paranasal sinuses, bilateral mastoid air cells and bilateral middle ear cavities well-aerated. Frontal sinuses are hypoplastic. Visualized orbits and globes normal in appearance. Other: Approximate 1.0 x 0.9 cm partially calcified meningioma arising from the floor of the RIGHT ANTERIOR cranial fossa. IMPRESSION: 1. No acute intracranial abnormality. 2. Mild age related cortical atrophy. 3. Incidental 1.0 cm partially calcified meningioma arising from the floor of the RIGHT ANTERIOR cranial fossa. Electronically Signed   By: Evangeline Dakin M.D.   On: 02/20/2019 19:29   Dg Chest Port 1 View  Result Date: 02/20/2019 CLINICAL DATA:  Chest pain EXAM: PORTABLE CHEST 1 VIEW COMPARISON:  11/09/2011 FINDINGS: Left subclavian AICD is stable. Normal  heart size. Low lung volumes. Bibasilar atelectasis. No pneumothorax. No  pleural effusion. IMPRESSION: Bibasilar atelectasis. Electronically Signed   By: Marybelle Killings M.D.   On: 02/20/2019 17:47   Vas US Carotid  Result Date: 02/21/2019 Carotid Arterial Duplex Study Indications:       Syncope and Dizziness. Risk Factors:      Hypertension, hyperlipidemia, coronary artery disease. Comparison Study:  No prior study on file for comparison Performing Technologist: Sharion Dove RVS  Examination Guidelines: A complete evaluation includes B-mode imaging, spectral Doppler, color Doppler, and power Doppler as needed of all accessible portions of each vessel. Bilateral testing is considered an integral part of a complete examination. Limited examinations for reoccurring indications may be performed as noted.  Right Carotid Findings: +----------+--------+--------+--------+--------+------------------+             PSV cm/s EDV cm/s Stenosis Describe Comments            +----------+--------+--------+--------+--------+------------------+  CCA Prox   80       18                         intimal thickening  +----------+--------+--------+--------+--------+------------------+  CCA Distal 78       17                         intimal thickening  +----------+--------+--------+--------+--------+------------------+  ICA Prox   65       14                                             +----------+--------+--------+--------+--------+------------------+  ICA Distal 51       15                                             +----------+--------+--------+--------+--------+------------------+  ECA        60       2                                              +----------+--------+--------+--------+--------+------------------+ +----------+--------+-------+--------+-------------------+             PSV cm/s EDV cms Describe Arm Pressure (mmHG)  +----------+--------+-------+--------+-------------------+  Subclavian 47                                             +----------+--------+-------+--------+-------------------+  +---------+--------+--+--------+--+  Vertebral PSV cm/s 46 EDV cm/s 16  +---------+--------+--+--------+--+  Left Carotid Findings: +----------+--------+--------+--------+-----------+--------+             PSV cm/s EDV cm/s Stenosis Describe    Comments  +----------+--------+--------+--------+-----------+--------+  CCA Prox   80       16                                      +----------+--------+--------+--------+-----------+--------+  CCA Distal 88       20                                      +----------+--------+--------+--------+-----------+--------+  ICA Prox   46       9                 homogeneous           +----------+--------+--------+--------+-----------+--------+  ICA Distal 49       14                                      +----------+--------+--------+--------+-----------+--------+  ECA        58       5                                       +----------+--------+--------+--------+-----------+--------+ +----------+--------+--------+--------+-------------------+  Subclavian PSV cm/s EDV cm/s Describe Arm Pressure (mmHG)  +----------+--------+--------+--------+-------------------+             55                                              +----------+--------+--------+--------+-------------------+ +---------+--------+--+--------+--+  Vertebral PSV cm/s 60 EDV cm/s 11  +---------+--------+--+--------+--+  Summary: Right Carotid: The extracranial vessels were near-normal with only minimal wall                thickening or plaque. Left Carotid: The extracranial vessels were near-normal with only minimal wall               thickening or plaque. Vertebrals:  Bilateral vertebral arteries demonstrate antegrade flow. Subclavians: Normal flow hemodynamics were seen in bilateral subclavian              arteries. *See table(s) above for measurements and observations.  Electronically signed by Monica Martinez MD on 02/21/2019 at 11:55:11 AM.    Final     Procedures Procedures (including critical care  time)  Medications Ordered in ED Medications  0.9 %  sodium chloride infusion (has no administration in time range)     Initial Impression / Assessment and Plan / ED Course  I have reviewed the triage vital signs and the nursing notes.  Pertinent labs & imaging results that were available during my care of the patient were reviewed by me and considered in my medical decision making (see chart for details).  Iv ns. Continuous pulse ox and monitor.   EMS gave ASA - EMS ecg with hr 55,  t inv v2 (?possible significant prox LAD narrowing) . ED ecg with sl st elev avr. v1-v2, t inv v6. Pt notes chest discomfort has completely resolved, although earlier symptoms, ecgs concerning for ACS - cardiology consulted, heparin per pharmacy.   Reviewed nursing notes and prior charts for additional history.   1730 - have spoken with cardiology on call, including concerning ems and ED ecg and concern possible prox lad issue, concerning symptoms, possible consideration of emergent cath lab (although remains cp free currently) - cardiology coming to see.  1745, cardiology fellow has evaluated patient and ecgs - he requests admit to medicine service.   Labs reviewed by me - initial trop normal, chem normal.  CXR reviewed by me - no pna.   Recheck patient - remains chest pain free. Will consult medicine for admission per cardiology request.   CRITICAL CARE RE: acute coronary syndrome, unstable angina/nstemi,  ischemic cardiomyopathy, abnormal/changed ECG Performed by: Mirna Mires Total critical care time: 40 minutes Critical care time was exclusive of separately billable procedures and treating other patients. Critical care was necessary to treat or prevent imminent or life-threatening deterioration. Critical care was time spent personally by me on the following activities: development of treatment plan with patient and/or surrogate as well as nursing, discussions with consultants, evaluation of  patient's response to treatment, examination of patient, obtaining history from patient or surrogate, ordering and performing treatments and interventions, ordering and review of laboratory studies, ordering and review of radiographic studies, pulse oximetry and re-evaluation of patient's condition.   Final Clinical Impressions(s) / ED Diagnoses   Final diagnoses:  None    ED Discharge Orders    None       Lajean Saver, MD 02/21/19 1501

## 2019-02-20 NOTE — H&P (Addendum)
TRH H&P    Patient Demographics:    Adrian Pace, is a 85 y.o. male  MRN: [id number]  DOB - [birth year]  Admit Date - 02/20/2019  Referring MD/NP/PA: Lajean Saver  Outpatient Primary MD for the patient is Kelton Pillar, MD  Patient coming from:  home  Chief complaint- chest tightness, dizziness   HPI:    Adrian Pace  is a 85 y.o. male,w CAD s/p cath 2003 w PCI LAD/ RCA , CHF (EF 25%), s/p ICD revision 2013, presents with c/o dizziness after supper, walked upstairs to bathroom continued to be dizzy with concomitant chest tightness, substernal without radiation.  Pt denies fever, chills, palp, sob, n/v, abd pain, diarrhea, brbpr, dysuria, lower ext edema.  Pt called EMS cause the dizziness (no vertigo) was not subsiding.  Pt states EMS gave him 4 baby aspirin but no nitro.  The dizziness and chest tightness resolved en route to ER. Note patient was bradycardic en route hr 48 ?  In ED,  T 97.5  P 75  Bp 129/63  Pox 93-98% on RA Wt 78kg  CT brain IMPRESSION: 1. No acute intracranial abnormality. 2. Mild age related cortical atrophy. 3. Incidental 1.0 cm partially calcified meningioma arising from the floor of the RIGHT ANTERIOR cranial fossa.  CXR IMPRESSION: Bibasilar atelectasis.  Wbc 5.9, Hgb 14.7, Plt 138 Na 140, K 4.6,  Bun 15, Creatinine 1.55 Glucose 120 Trop 0.01  EKG nsr at 70, LAD, nl int, t inversion in 1, avl, v5,6,  q in v1,2 and hint of st elevation in v1-3  No current chest pain at this time.  Pt seen by cardiology in ED, per ED, recommended heparin gtt Pt will be admitted for chest pain and dizziness    Review of systems:    In addition to the HPI above,  No Fever-chills, No Headache, No changes with Vision or hearing, No problems swallowing food or Liquids, No Cough or Shortness of Breath, No Abdominal pain, No Nausea or Vomiting, bowel movements are regular, No  Blood in stool or Urine, No dysuria, No new skin rashes or bruises, No new joints pains-aches,  No new weakness, tingling, numbness in any extremity, No recent weight gain or loss, No polyuria, polydypsia or polyphagia, No significant Mental Stressors.  All other systems reviewed and are negative.    Past History of the following :    Past Medical History:  Diagnosis Date  . Anxiety    Takes Ativan as needed  . Cardiac dysrhythmia, unspecified   . CHF (congestive heart failure) ([place])   . Coronary artery disease    Hx of cardiac caths   . Enlarged prostate   . GERD (gastroesophageal reflux disease)    Takes prilosec  . Gout   . Heart attack ([place])   . HTN (hypertension)   . Hyperkalemia   . Hyperlipemia   . Ischemic cardiomyopathy   . LV dysfunction   . Myocardial infarct ([place]) 1988  . Panic attacks   . Personal history of peptic ulcer disease   .  Renal insufficiency   . Syncope and collapse   . Ventricular tachycardia [facility])       Past Surgical History:  Procedure Laterality Date  . CARDIAC CATHETERIZATION  2003  . CARDIAC DEFIBRILLATOR PLACEMENT    . COLONOSCOPY    . CORONARY ANGIOPLASTY    . ESOPHAGOGASTRODUODENOSCOPY     with biopsy  . HERNIA REPAIR  01/01/2012  . INSERT / REPLACE / REMOVE PACEMAKER  2005   ICD; replaced in Jan 2013  . LEAD REVISION N/A 11/08/2011   Procedure: LEAD REVISION;  Surgeon: Thompson Grayer, MD;  Location: [facility] C[facility] LAB;  Service: Cardiovascular;  Laterality: N/A;  . UMBILICAL HERNIA REPAIR    . UMBILICAL HERNIA REPAIR  01/01/2012   Procedure: HERNIA REPAIR UMBILICAL ADULT;  Surgeon: Merrie Roof, MD;  Location: [place];  Service: General;  Laterality: N/A;  umbilical hernia repair with mesh      Social History:      Social History   Tobacco Use  . Smoking status: Never Smoker  . Smokeless tobacco: Never Used  Substance Use Topics  . Alcohol use: Yes    Comment: occassionally       Family History :     Family History   Problem Relation Age of Onset  . Heart attack Father        x2  . Heart disease Father   . Cancer Father        bladder and prostate  . Anesthesia problems Neg Hx   . Hypotension Neg Hx   . Malignant hyperthermia Neg Hx   . Pseudochol deficiency Neg Hx        Home Medications:   Prior to Admission medications   Medication Sig Start Date End Date Taking? Authorizing Provider  aspirin 325 MG tablet Take 325 mg by mouth daily.     Yes [provider]  benazepril (LOTENSIN) 10 MG tablet TAKE ONE TABLET BY MOUTH DAILY Patient taking differently: Take 10 mg by mouth daily.  06/01/18  Yes Lelon Perla, MD  carvedilol (COREG) 25 MG tablet TAKE ONE TABLET BY MOUTH TWICE A DAY WITH A MEAL Patient taking differently: Take 25 mg by mouth 2 (two) times daily with a meal.  02/11/19  Yes Evans Lance, MD  LORazepam (ATIVAN) 0.5 MG tablet Take 0.5-1 mg by mouth See admin instructions. Take 1-2 tablets as needed for Anxiety 12/08/13  Yes [provider]  simvastatin (ZOCOR) 40 MG tablet TAKE ONE TABLET BY MOUTH DAILY AT 6PM Patient taking differently: Take 40 mg by mouth daily at 6 PM.  10/12/18  Yes Crenshaw, Denice Bors, MD     Allergies:     Allergies  Allergen Reactions  . Codeine Anxiety     Physical Exam:   Vitals  Blood pressure 129/63, pulse 75, temperature (!) 97.5 F (36.4 C), temperature source Oral, resp. rate 20, height 5\' 8"  (1.727 m), weight 78 kg, SpO2 93 %.  1.  General: axox3  2. Psychiatric: euthymic  3. Neurologic: cn2-12 intact, reflexes 2+ symmetric, diffuse with no clonus, motor 5/5 in all 4 ext  4. HEENMT:  Anicteric, pupils 1.38mm symmetric, direct, consensual, near intact  5. Respiratory : CTAB  6. Cardiovascular : rrr s1, s2, no m/g/r    7. Gastrointestinal:  Abd: soft, nt, nd, +bs  8. Skin:  Ext: no c/c/e, skin no rash  9.Musculoskeletal:  Good ROM  No adenopathy    Data Review:    CBC  Recent Labs  Lab 02/20/19  1721  WBC 5.9  HGB 14.7  HCT 44.9  PLT 138*  MCV 89.8  MCH 29.4  MCHC 32.7  RDW 13.2   ------------------------------------------------------------------------------------------------------------------  Results for orders placed or performed during the hospital encounter of 02/20/19 (from the past 48 hour(s))  CBC     Status: Abnormal   Collection Time: 02/20/19  5:21 PM  Result Value Ref Range   WBC 5.9 4.0 - 10.5 K/uL   RBC 5.00 4.22 - 5.81 MIL/uL   Hemoglobin 14.7 13.0 - 17.0 g/dL   HCT 44.9 39.0 - 52.0 %   MCV 89.8 80.0 - 100.0 fL   MCH 29.4 26.0 - 34.0 pg   MCHC 32.7 30.0 - 36.0 g/dL   RDW 13.2 11.5 - 15.5 %   Platelets 138 (L) 150 - 400 K/uL    Comment: REPEATED TO VERIFY PLATELET COUNT CONFIRMED BY SMEAR SPECIMEN CHECKED FOR CLOTS    nRBC 0.0 0.0 - 0.2 %    Comment: Performed at [facility], [place] [address], [place], [place] [address]  Basic metabolic panel     Status: Abnormal   Collection Time: 02/20/19  5:21 PM  Result Value Ref Range   Sodium 140 135 - 145 mmol/L   Potassium 4.6 3.5 - 5.1 mmol/L   Chloride 107 98 - 111 mmol/L   CO2 23 22 - 32 mmol/L   Glucose, Bld 120 (H) 70 - 99 mg/dL   BUN 15 8 - 23 mg/dL   Creatinine, Ser 1.55 (H) 0.61 - 1.24 mg/dL   Calcium 8.5 (L) 8.9 - 10.3 mg/dL   GFR calc non Af Amer 41 (L) >60 mL/min   GFR calc Af Amer 47 (L) >60 mL/min   Anion gap 10 5 - 15    Comment: Performed at [facility] [address], [place], [place] [address]  I-stat troponin, ED     Status: None   Collection Time: 02/20/19  5:24 PM  Result Value Ref Range   Troponin i, poc 0.01 0.00 - 0.08 ng/mL   Comment 3            Comment: Due to the release kinetics of cTnI, a negative result within the first hours of the onset of symptoms does not rule out myocardial infarction with certainty. If myocardial infarction is still suspected, repeat the test at appropriate intervals.     Chemistries  Recent Labs  Lab 02/20/19 1721  NA 140   K 4.6  CL 107  CO2 23  GLUCOSE 120*  BUN 15  CREATININE 1.55*  CALCIUM 8.5*   ------------------------------------------------------------------------------------------------------------------  ------------------------------------------------------------------------------------------------------------------ GFR: Estimated Creatinine Clearance: 34.3 mL/min (A) (by C-G formula based on SCr of 1.55 mg/dL (H)). Liver Function Tests: No results for input(s): AST, ALT, ALKPHOS, BILITOT, PROT, ALBUMIN in the last 168 hours. No results for input(s): LIPASE, AMYLASE in the last 168 hours. No results for input(s): AMMONIA in the last 168 hours. Coagulation Profile: No results for input(s): INR, PROTIME in the last 168 hours. Cardiac Enzymes: No results for input(s): CKTOTAL, CKMB, CKMBINDEX, TROPONINI in the last 168 hours. BNP (last 3 results) No results for input(s): PROBNP in the last 8760 hours. HbA1C: No results for input(s): HGBA1C in the last 72 hours. CBG: No results for input(s): GLUCAP in the last 168 hours. Lipid Profile: No results for input(s): CHOL, HDL, LDLCALC, TRIG, CHOLHDL, LDLDIRECT in the last 72 hours. Thyroid Function Tests: No results for input(s):  TSH, T4TOTAL, FREET4, T3FREE, THYROIDAB in the last 72 hours. Anemia Panel: No results for input(s): VITAMINB12, FOLATE, FERRITIN, TIBC, IRON, RETICCTPCT in the last 72 hours.  --------------------------------------------------------------------------------------------------------------- Urine analysis:    Component Value Date/Time   COLORURINE RED (A) 12/25/2011 1013   APPEARANCEUR CLOUDY (A) 12/25/2011 1013   LABSPEC 1.016 12/25/2011 1013   PHURINE 5.5 12/25/2011 1013   GLUCOSEU NEGATIVE 12/25/2011 1013   HGBUR LARGE (A) 12/25/2011 1013   BILIRUBINUR SMALL (A) 12/25/2011 1013   KETONESUR NEGATIVE 12/25/2011 1013   PROTEINUR 100 (A) 12/25/2011 1013   UROBILINOGEN 0.2 12/25/2011 1013   NITRITE NEGATIVE  12/25/2011 1013   LEUKOCYTESUR SMALL (A) 12/25/2011 1013      Imaging Results:    Ct Head Wo Contrast  Result Date: 02/20/2019 CLINICAL DATA:  85 year old with a near syncopal episode while watching television earlier this evening. EXAM: CT HEAD WITHOUT CONTRAST TECHNIQUE: Contiguous axial images were obtained from the base of the skull through the vertex without intravenous contrast. COMPARISON:  None. FINDINGS: Brain: Mild age related cortical atrophy. Asymmetry in the size of the LATERAL ventricles, RIGHT greater than LEFT, is felt to be developmental. No mass lesion. No midline shift. No acute hemorrhage or hematoma. No extra-axial fluid collections. No evidence of acute infarction. Vascular: Moderate BILATERAL carotid siphon and BILATERAL vertebral artery atherosclerosis. No hyperdense vessel. Skull: No skull fracture or other focal osseous abnormality involving the skull. Sinuses/Orbits: Visualized paranasal sinuses, bilateral mastoid air cells and bilateral middle ear cavities well-aerated. Frontal sinuses are hypoplastic. Visualized orbits and globes normal in appearance. Other: Approximate 1.0 x 0.9 cm partially calcified meningioma arising from the floor of the RIGHT ANTERIOR cranial fossa. IMPRESSION: 1. No acute intracranial abnormality. 2. Mild age related cortical atrophy. 3. Incidental 1.0 cm partially calcified meningioma arising from the floor of the RIGHT ANTERIOR cranial fossa. Electronically Signed   By: Evangeline Dakin M.D.   On: 02/20/2019 19:29   Dg Chest Port 1 View  Result Date: 02/20/2019 CLINICAL DATA:  Chest pain EXAM: PORTABLE CHEST 1 VIEW COMPARISON:  11/09/2011 FINDINGS: Left subclavian AICD is stable. Normal heart size. Low lung volumes. Bibasilar atelectasis. No pneumothorax. No pleural effusion. IMPRESSION: Bibasilar atelectasis. Electronically Signed   By: Marybelle Killings M.D.   On: 02/20/2019 17:47       Assessment & Plan:    Principal Problem:   Chest pain  Active Problems:   Essential hypertension   Anxiety   CAD (coronary artery disease)   Dizziness   Thrombocytopenia (HCC)  Chest pain, hx of CAD s/p PCI Lad/ Rca , CHF (EF 25-30%) Tele Trop I q6h x3 Check Lipid, check  Hga1c Check cardiac echo Check EKG in am Cont heparin GTT Cont Aspirin 325mg  po qday Cont Simvastatin 40mg  po qhs Cont Benazepril 10mg  po qday Decrease Carvedilol to 12.5mg  po bid (due to ? Bradycardia) Start lasix 10mg  po qday since on heparin gtt Appreciate cardiology input  Bradycardia ?? Decrease Carvedilol as above  Dizziness Tele Check carotid ultrasound  Check EEG due to meningioma Consider neurology consult  ARF, mild Cont Benazepril  Check cmp in am  Anxiety Cont lorazepam 0.5mg  po qday prn  Thrombocytopenia Check cbc in am  DVT Prophylaxis-   Heparin   AM Labs Ordered, also please review Full Orders  Family Communication: Admission, patients condition and plan of care including tests being ordered have been discussed with the patient  who indicate understanding and agree with the plan and Code Status.  Code Status:  FULL CODE  Admission status: Observation: Based on patients clinical presentation and evaluation of above clinical data, I have made determination that patient meets observation criteria at this time.   Time spent in minutes : 70    Jani Gravel M.D on 02/20/2019 at 7:42 PM

## 2019-02-20 NOTE — ED Notes (Signed)
ED TO INPATIENT HANDOFF REPORT  ED Nurse Name and Phone #: [phone]  S Name/Age/Gender Adrian Pace 85 y.o. male Room/Bed: 022C/022C  Code Status   Code Status: Prior  Home/SNF/Other Home Patient oriented to: self, place, time and situation Is this baseline? Yes   Triage Complete: Triage complete  Chief Complaint CP,Hypotensive   Triage Note Pt arrives to ED from home with complaints of sudden chest pain that led to a near syncopal episode while sitting down and watching tv. EMS reports pt felt nauseous and got sweaty before the episode. On EMS arrival pt was found to be brady cardiac and hypotensive. HR 48, BP 70/42. Pt received 324 ASA.    Allergies Allergies  Allergen Reactions  . Codeine Anxiety    Level of Care/Admitting Diagnosis ED Disposition    ED Disposition Condition Comment   Admit  Hospital Area: [facility] [place] [[id number]]  Level of Care: Progressive [102]  I expect the patient will be discharged within 24 hours: No (not a candidate for 5C-Observation unit)  Covid Evaluation: N/A  Diagnosis: Chest pain [253664]  Admitting Physician: Jani Gravel [[id number]]  Attending Physician: Jani Gravel [[id number]]  PT Class (Do Not Modify): Observation [104]  PT Acc Code (Do Not Modify): Observation [10022]       B Medical/Surgery History Past Medical History:  Diagnosis Date  . Anxiety    Takes Ativan as needed  . Cardiac dysrhythmia, unspecified   . CHF (congestive heart failure) ([place])   . Coronary artery disease    Hx of cardiac caths   . Enlarged prostate   . GERD (gastroesophageal reflux disease)    Takes prilosec  . Gout   . Heart attack ([place])   . HTN (hypertension)   . Hyperkalemia   . Hyperlipemia   . Ischemic cardiomyopathy   . LV dysfunction   . Myocardial infarct ([place]) 1988  . Panic attacks   . Personal history of peptic ulcer disease   . Renal insufficiency   . Syncope and collapse   . Ventricular tachycardia [facility])    Past  Surgical History:  Procedure Laterality Date  . CARDIAC CATHETERIZATION  2003  . CARDIAC DEFIBRILLATOR PLACEMENT    . COLONOSCOPY    . CORONARY ANGIOPLASTY    . ESOPHAGOGASTRODUODENOSCOPY     with biopsy  . HERNIA REPAIR  01/01/2012  . INSERT / REPLACE / REMOVE PACEMAKER  2005   ICD; replaced in Jan 2013  . LEAD REVISION N/A 11/08/2011   Procedure: LEAD REVISION;  Surgeon: Thompson Grayer, MD;  Location: [facility] CATH LAB;  Service: Cardiovascular;  Laterality: N/A;  . UMBILICAL HERNIA REPAIR    . UMBILICAL HERNIA REPAIR  01/01/2012   Procedure: HERNIA REPAIR UMBILICAL ADULT;  Surgeon: Merrie Roof, MD;  Location: [place];  Service: General;  Laterality: N/A;  umbilical hernia repair with mesh     A IV Location/Drains/Wounds Patient Lines/Drains/Airways Status   Active Line/Drains/Airways    Name:   Placement date:   Placement time:   Site:   Days:   Peripheral IV 02/20/19 Left Forearm   02/20/19    1721    Forearm   less than 1   Incision 40/34/74 Umbilicus Other (Comment)   01/01/12    0922     2607          Intake/Output Last 24 hours No intake or output data in the 24 hours ending 02/20/19 1902  Labs/Imaging Results for orders placed or performed  during the hospital encounter of 02/20/19 (from the past 48 hour(s))  CBC     Status: Abnormal   Collection Time: 02/20/19  5:21 PM  Result Value Ref Range   WBC 5.9 4.0 - 10.5 K/uL   RBC 5.00 4.22 - 5.81 MIL/uL   Hemoglobin 14.7 13.0 - 17.0 g/dL   HCT 44.9 39.0 - 52.0 %   MCV 89.8 80.0 - 100.0 fL   MCH 29.4 26.0 - 34.0 pg   MCHC 32.7 30.0 - 36.0 g/dL   RDW 13.2 11.5 - 15.5 %   Platelets 138 (L) 150 - 400 K/uL    Comment: REPEATED TO VERIFY PLATELET COUNT CONFIRMED BY SMEAR SPECIMEN CHECKED FOR CLOTS    nRBC 0.0 0.0 - 0.2 %    Comment: Performed at [facility], [place] [address], [place], [place] [address]  Basic metabolic panel     Status: Abnormal   Collection Time: 02/20/19  5:21 PM  Result Value Ref Range   Sodium 140  135 - 145 mmol/L   Potassium 4.6 3.5 - 5.1 mmol/L   Chloride 107 98 - 111 mmol/L   CO2 23 22 - 32 mmol/L   Glucose, Bld 120 (H) 70 - 99 mg/dL   BUN 15 8 - 23 mg/dL   Creatinine, Ser 1.55 (H) 0.61 - 1.24 mg/dL   Calcium 8.5 (L) 8.9 - 10.3 mg/dL   GFR calc non Af Amer 41 (L) >60 mL/min   GFR calc Af Amer 47 (L) >60 mL/min   Anion gap 10 5 - 15    Comment: Performed at [facility] [address] [place], [place] [address]  I-stat troponin, ED     Status: None   Collection Time: 02/20/19  5:24 PM  Result Value Ref Range   Troponin i, poc 0.01 0.00 - 0.08 ng/mL   Comment 3            Comment: Due to the release kinetics of cTnI, a negative result within the first hours of the onset of symptoms does not rule out myocardial infarction with certainty. If myocardial infarction is still suspected, repeat the test at appropriate intervals.    Dg Chest Port 1 View  Result Date: 02/20/2019 CLINICAL DATA:  Chest pain EXAM: PORTABLE CHEST 1 VIEW COMPARISON:  11/09/2011 FINDINGS: Left subclavian AICD is stable. Normal heart size. Low lung volumes. Bibasilar atelectasis. No pneumothorax. No pleural effusion. IMPRESSION: Bibasilar atelectasis. Electronically Signed   By: Marybelle Killings M.D.   On: 02/20/2019 17:47    Pending Labs Unresulted Labs (From admission, onward)    Start     Ordered   02/22/19 0500  Heparin level (unfractionated)  Daily,   R     02/20/19 1729   02/21/19 0500  CBC  Daily,   R     02/20/19 1737   02/21/19 0200  Heparin level (unfractionated)  Once-Timed,   R     02/20/19 1729          Vitals/Pain Today's Vitals   02/20/19 1730 02/20/19 1800 02/20/19 1815 02/20/19 1830  BP: 119/66 125/65 125/62 127/61  Pulse: 61 70 77 76  Resp: 12 20 (!) 21 (!) 27  Temp:      TempSrc:      SpO2: 96% 96% 95% 96%  Weight:      Height:      PainSc:        Isolation Precautions No active isolations  Medications Medications  0.9 %  sodium chloride infusion (has no  administration in time range)  heparin ADULT infusion 100 units/mL (25000 units/275mL sodium chloride 0.45%) (950 Units/hr Intravenous New Bag/Given 02/20/19 1742)  heparin bolus via infusion 4,000 Units (4,000 Units Intravenous Bolus from Bag 02/20/19 1742)    Mobility walks Low fall risk   Focused Assessments Cardiac Assessment Handoff:  Cardiac Rhythm: Normal sinus rhythm No results found for: CKTOTAL, CKMB, CKMBINDEX, TROPONINI No results found for: DDIMER Does the Patient currently have chest pain? No      R Recommendations: See Admitting Provider Note  Report given to:   Additional Notes:  [employer] showed no arrhythmias today. Trend trops possible cath tomorrow.

## 2019-02-21 ENCOUNTER — Observation Stay (HOSPITAL_COMMUNITY): Payer: Medicare Other

## 2019-02-21 DIAGNOSIS — I214 Non-ST elevation (NSTEMI) myocardial infarction: Secondary | ICD-10-CM | POA: Diagnosis present

## 2019-02-21 DIAGNOSIS — I351 Nonrheumatic aortic (valve) insufficiency: Secondary | ICD-10-CM | POA: Diagnosis not present

## 2019-02-21 DIAGNOSIS — R55 Syncope and collapse: Secondary | ICD-10-CM

## 2019-02-21 DIAGNOSIS — Z79899 Other long term (current) drug therapy: Secondary | ICD-10-CM | POA: Diagnosis not present

## 2019-02-21 DIAGNOSIS — I249 Acute ischemic heart disease, unspecified: Secondary | ICD-10-CM | POA: Diagnosis present

## 2019-02-21 DIAGNOSIS — I5022 Chronic systolic (congestive) heart failure: Secondary | ICD-10-CM | POA: Diagnosis present

## 2019-02-21 DIAGNOSIS — F410 Panic disorder [episodic paroxysmal anxiety] without agoraphobia: Secondary | ICD-10-CM | POA: Diagnosis present

## 2019-02-21 DIAGNOSIS — I252 Old myocardial infarction: Secondary | ICD-10-CM | POA: Diagnosis not present

## 2019-02-21 DIAGNOSIS — F40240 Claustrophobia: Secondary | ICD-10-CM | POA: Diagnosis present

## 2019-02-21 DIAGNOSIS — F5105 Insomnia due to other mental disorder: Secondary | ICD-10-CM | POA: Diagnosis present

## 2019-02-21 DIAGNOSIS — Z95810 Presence of automatic (implantable) cardiac defibrillator: Secondary | ICD-10-CM | POA: Diagnosis not present

## 2019-02-21 DIAGNOSIS — I472 Ventricular tachycardia: Secondary | ICD-10-CM | POA: Diagnosis not present

## 2019-02-21 DIAGNOSIS — K219 Gastro-esophageal reflux disease without esophagitis: Secondary | ICD-10-CM | POA: Diagnosis present

## 2019-02-21 DIAGNOSIS — M109 Gout, unspecified: Secondary | ICD-10-CM | POA: Diagnosis present

## 2019-02-21 DIAGNOSIS — R42 Dizziness and giddiness: Secondary | ICD-10-CM

## 2019-02-21 DIAGNOSIS — R079 Chest pain, unspecified: Secondary | ICD-10-CM | POA: Diagnosis not present

## 2019-02-21 DIAGNOSIS — D696 Thrombocytopenia, unspecified: Secondary | ICD-10-CM | POA: Diagnosis present

## 2019-02-21 DIAGNOSIS — E785 Hyperlipidemia, unspecified: Secondary | ICD-10-CM | POA: Diagnosis present

## 2019-02-21 DIAGNOSIS — D329 Benign neoplasm of meninges, unspecified: Secondary | ICD-10-CM | POA: Diagnosis present

## 2019-02-21 DIAGNOSIS — I110 Hypertensive heart disease with heart failure: Secondary | ICD-10-CM | POA: Diagnosis present

## 2019-02-21 DIAGNOSIS — R001 Bradycardia, unspecified: Secondary | ICD-10-CM | POA: Diagnosis present

## 2019-02-21 DIAGNOSIS — Z7982 Long term (current) use of aspirin: Secondary | ICD-10-CM | POA: Diagnosis not present

## 2019-02-21 DIAGNOSIS — I340 Nonrheumatic mitral (valve) insufficiency: Secondary | ICD-10-CM

## 2019-02-21 DIAGNOSIS — Z885 Allergy status to narcotic agent status: Secondary | ICD-10-CM | POA: Diagnosis not present

## 2019-02-21 DIAGNOSIS — I255 Ischemic cardiomyopathy: Secondary | ICD-10-CM | POA: Diagnosis present

## 2019-02-21 DIAGNOSIS — Z8711 Personal history of peptic ulcer disease: Secondary | ICD-10-CM | POA: Diagnosis not present

## 2019-02-21 DIAGNOSIS — N179 Acute kidney failure, unspecified: Secondary | ICD-10-CM | POA: Diagnosis present

## 2019-02-21 DIAGNOSIS — Z955 Presence of coronary angioplasty implant and graft: Secondary | ICD-10-CM | POA: Diagnosis not present

## 2019-02-21 DIAGNOSIS — I2510 Atherosclerotic heart disease of native coronary artery without angina pectoris: Secondary | ICD-10-CM | POA: Diagnosis present

## 2019-02-21 LAB — HEMOGLOBIN A1C
Hgb A1c MFr Bld: 5.8 % — ABNORMAL HIGH (ref 4.8–5.6)
Mean Plasma Glucose: 119.76 mg/dL

## 2019-02-21 LAB — COMPREHENSIVE METABOLIC PANEL
ALT: 21 U/L (ref 0–44)
AST: 53 U/L — ABNORMAL HIGH (ref 15–41)
Albumin: 3.3 g/dL — ABNORMAL LOW (ref 3.5–5.0)
Alkaline Phosphatase: 70 U/L (ref 38–126)
Anion gap: 7 (ref 5–15)
BUN: 15 mg/dL (ref 8–23)
CO2: 23 mmol/L (ref 22–32)
Calcium: 8.7 mg/dL — ABNORMAL LOW (ref 8.9–10.3)
Chloride: 111 mmol/L (ref 98–111)
Creatinine, Ser: 1.34 mg/dL — ABNORMAL HIGH (ref 0.61–1.24)
GFR calc Af Amer: 56 mL/min — ABNORMAL LOW (ref >60)
GFR calc non Af Amer: 48 mL/min — ABNORMAL LOW (ref >60)
Glucose, Bld: 113 mg/dL — ABNORMAL HIGH (ref 70–99)
Potassium: 4.2 mmol/L (ref 3.5–5.1)
Sodium: 141 mmol/L (ref 135–145)
Total Bilirubin: 0.6 mg/dL (ref 0.3–1.2)
Total Protein: 5.9 g/dL — ABNORMAL LOW (ref 6.5–8.1)

## 2019-02-21 LAB — CBC
HCT: 43.7 % (ref 39.0–52.0)
Hemoglobin: 14.6 g/dL (ref 13.0–17.0)
MCH: 29.3 pg (ref 26.0–34.0)
MCHC: 33.4 g/dL (ref 30.0–36.0)
MCV: 87.6 fL (ref 80.0–100.0)
Platelets: 135 10*3/uL — ABNORMAL LOW (ref 150–400)
RBC: 4.99 MIL/uL (ref 4.22–5.81)
RDW: 13.3 % (ref 11.5–15.5)
WBC: 8 10*3/uL (ref 4.0–10.5)
nRBC: 0 % (ref 0.0–0.2)

## 2019-02-21 LAB — ECHOCARDIOGRAM LIMITED
Height: 68 in
Weight: 2801.6 oz

## 2019-02-21 LAB — TROPONIN I
Troponin I: 6.79 ng/mL (ref <0.03)
Troponin I: 9.62 ng/mL (ref <0.03)

## 2019-02-21 LAB — LIPID PANEL
Cholesterol: 95 mg/dL (ref 0–200)
HDL: 28 mg/dL — ABNORMAL LOW (ref >40)
LDL Cholesterol: 50 mg/dL (ref 0–99)
Total CHOL/HDL Ratio: 3.4 RATIO
Triglycerides: 83 mg/dL (ref <150)
VLDL: 17 mg/dL (ref 0–40)

## 2019-02-21 LAB — TSH: TSH: 0.534 u[IU]/mL (ref 0.350–4.500)

## 2019-02-21 LAB — HEPARIN LEVEL (UNFRACTIONATED)
Heparin Unfractionated: 0.41 IU/mL (ref 0.30–0.70)
Heparin Unfractionated: 0.44 IU/mL (ref 0.30–0.70)

## 2019-02-21 MED ORDER — ZOLPIDEM TARTRATE 5 MG PO TABS
5.0000 mg | ORAL_TABLET | Freq: Every evening | ORAL | Status: DC | PRN
  Administered 2019-02-21 – 2019-02-22 (×2): 5 mg via ORAL
  Filled 2019-02-21 (×2): qty 1

## 2019-02-21 MED ORDER — SODIUM CHLORIDE 0.9 % WEIGHT BASED INFUSION
3.0000 mL/kg/h | INTRAVENOUS | Status: DC
  Administered 2019-02-22: 04:00:00 3 mL/kg/h via INTRAVENOUS

## 2019-02-21 MED ORDER — SODIUM CHLORIDE 0.9 % IV SOLN: 250.0000 mL | INTRAVENOUS | Status: DC | PRN

## 2019-02-21 MED ORDER — SODIUM CHLORIDE 0.9% FLUSH: 3.0000 mL | Freq: Two times a day (BID) | INTRAVENOUS | Status: DC

## 2019-02-21 MED ORDER — PERFLUTREN LIPID MICROSPHERE
1.0000 mL | INTRAVENOUS | Status: AC | PRN
  Administered 2019-02-21: 11:00:00 3 mL via INTRAVENOUS
  Filled 2019-02-21 (×2): qty 10

## 2019-02-21 MED ORDER — SODIUM CHLORIDE 0.9 % WEIGHT BASED INFUSION: 1.0000 mL/kg/h | INTRAVENOUS | Status: DC

## 2019-02-21 MED ORDER — SODIUM CHLORIDE 0.9% FLUSH: 3.0000 mL | INTRAVENOUS | Status: DC | PRN

## 2019-02-21 NOTE — Progress Notes (Signed)
Pt had 10 beat run V-tach- asymptomatic- will continue to monitor

## 2019-02-21 NOTE — Progress Notes (Signed)
PROGRESS NOTE    Adrian Pace  ZJI:[id number] DOB: 11/16/1932 DOA: 02/20/2019 PCP: Kelton Pillar, MD   Brief Narrative:  Adrian Pace  is Adrian Pace 85 y.o. male,w CAD s/p cath 2003 w PCI LAD/ RCA , CHF (EF 25%), s/p ICD revision 2013, presents with c/o dizziness after supper, walked upstairs to bathroom continued to be dizzy with concomitant chest tightness, substernal without radiation.  Pt denies fever, chills, palp, sob, n/v, abd pain, diarrhea, brbpr, dysuria, lower ext edema.  Pt called EMS cause the dizziness (no vertigo) was not subsiding.  Pt states EMS gave him 4 baby aspirin but no nitro.  The dizziness and chest tightness resolved en route to ER. Note patient was bradycardic en route hr 48 ?  In ED,  T 97.5  P 75  Bp 129/63  Pox 93-98% on RA Wt 78kg  CT brain IMPRESSION: 1. No acute intracranial abnormality. 2. Mild age related cortical atrophy. 3. Incidental 1.0 cm partially calcified meningioma arising from the floor of the RIGHT ANTERIOR cranial fossa.  CXR IMPRESSION: Bibasilar atelectasis.  Wbc 5.9, Hgb 14.7, Plt 138 Na 140, K 4.6,  Bun 15, Creatinine 1.55 Glucose 120 Trop 0.01  EKG nsr at 70, LAD, nl int, t inversion in 1, avl, v5,6,  q in v1,2 and hint of st elevation in v1-3  No current chest pain at this time.  Pt seen by cardiology in ED, per ED, recommended heparin gtt Pt will be admitted for chest pain and dizziness  Assessment & Plan:   Principal Problem:   Chest pain Active Problems:   Essential hypertension   Anxiety   CAD (coronary artery disease)   Dizziness   Thrombocytopenia (HCC)   Meningioma (HCC)   NSTEMI   Chest pain, hx of CAD s/p PCI Lad/ Rca , CHF (EF 25-30%) Appreciate cards assistance, planning for [facility] 4/27 NPO after midnight Follow troponins (9.62 today) LDL 50, A1c 5.8 EKG appears c/w priors Echo pending  Heparin gtt Continue ASA, statin, ACE, carvedilol (dose decreased in setting of  bradycardia)  Bradycardia Decrease Carvedilol as above  Dizziness Seems resolved today Head CT without acute abnormality Will continue to monitor   ARF, mild Improved, baseline creatinine appears to be around 1.3  Anxiety   Insomnia Cont lorazepam 0.5mg  po qday prn Will add ambien qhs prn for sleep  Thrombocytopenia Mild, follow   DVT prophylaxis: heparin gtt Code Status: (full  Family Communication: none at bedside Disposition Plan: pending  Consultants:   cardiology  Procedures:   none  Antimicrobials: Anti-infectives (From admission, onward)   None         Subjective: Denies CP or SOB. Symptoms improved after EMS.  Objective: Vitals:   02/21/19 0004 02/21/19 0434 02/21/19 0746 02/21/19 1200  BP: 131/81 130/77 129/73   Pulse: 95 86 77 78  Resp: 11 14 17 12   Temp:  99 F (37.2 C) 98.4 F (36.9 C)   TempSrc:  Oral Oral   SpO2: 92% 95% 94% 94%  Weight:  79.4 kg  79.2 kg  Height:        Intake/Output Summary (Last 24 hours) at 02/21/2019 1255 Last data filed at 02/21/2019 0600 Gross per 24 hour  Intake 485.75 ml  Output 350 ml  Net 135.75 ml   Filed Weights   02/20/19 2017 02/21/19 0434 02/21/19 1200  Weight: 81.1 kg 79.4 kg 79.2 kg    Examination:  General exam: Appears calm and comfortable  Respiratory system: Clear to auscultation.  Respiratory effort normal. Cardiovascular system: S1 & S2 heard, RRR.  Gastrointestinal system: Abdomen is nondistended, soft and nontender.  Central nervous system: Alert and oriented. No focal neurological deficits. Extremities: no lee Skin: No rashes, lesions or ulcers Psychiatry: Judgement and insight appear normal. Mood & affect appropriate.     Data Reviewed: I have personally reviewed following labs and imaging studies  CBC: Recent Labs  Lab 02/20/19 1721 02/21/19 0226  WBC 5.9 8.0  HGB 14.7 14.6  HCT 44.9 43.7  MCV 89.8 87.6  PLT 138* 371*   Basic Metabolic Panel: Recent Labs   Lab 02/20/19 1721 02/21/19 0226  NA 140 141  K 4.6 4.2  CL 107 111  CO2 23 23  GLUCOSE 120* 113*  BUN 15 15  CREATININE 1.55* 1.34*  CALCIUM 8.5* 8.7*   GFR: Estimated Creatinine Clearance: 39.7 mL/min (Giannina Bartolome) (by C-G formula based on SCr of 1.34 mg/dL (H)). Liver Function Tests: Recent Labs  Lab 02/21/19 0226  AST 53*  ALT 21  ALKPHOS 70  BILITOT 0.6  PROT 5.9*  ALBUMIN 3.3*   No results for input(s): LIPASE, AMYLASE in the last 168 hours. No results for input(s): AMMONIA in the last 168 hours. Coagulation Profile: No results for input(s): INR, PROTIME in the last 168 hours. Cardiac Enzymes: Recent Labs  Lab 02/20/19 2156 02/21/19 0226 02/21/19 0913  TROPONINI 1.97* 6.79* 9.62*   BNP (last 3 results) No results for input(s): PROBNP in the last 8760 hours. HbA1C: Recent Labs    02/21/19 0226  HGBA1C 5.8*   CBG: No results for input(s): GLUCAP in the last 168 hours. Lipid Profile: Recent Labs    02/21/19 0226  CHOL 95  HDL 28*  LDLCALC 50  TRIG 83  CHOLHDL 3.4   Thyroid Function Tests: Recent Labs    02/21/19 0226  TSH 0.534   Anemia Panel: No results for input(s): VITAMINB12, FOLATE, FERRITIN, TIBC, IRON, RETICCTPCT in the last 72 hours. Sepsis Labs: No results for input(s): PROCALCITON, LATICACIDVEN in the last 168 hours.  No results found for this or any previous visit (from the past 240 hour(s)).       Radiology Studies: Ct Head Wo Contrast  Result Date: 02/20/2019 CLINICAL DATA:  85 year old with Erol Flanagin near syncopal episode while watching television earlier this evening. EXAM: CT HEAD WITHOUT CONTRAST TECHNIQUE: Contiguous axial images were obtained from the base of the skull through the vertex without intravenous contrast. COMPARISON:  None. FINDINGS: Brain: Mild age related cortical atrophy. Asymmetry in the size of the LATERAL ventricles, RIGHT greater than LEFT, is felt to be developmental. No mass lesion. No midline shift. No acute  hemorrhage or hematoma. No extra-axial fluid collections. No evidence of acute infarction. Vascular: Moderate BILATERAL carotid siphon and BILATERAL vertebral artery atherosclerosis. No hyperdense vessel. Skull: No skull fracture or other focal osseous abnormality involving the skull. Sinuses/Orbits: Visualized paranasal sinuses, bilateral mastoid air cells and bilateral middle ear cavities well-aerated. Frontal sinuses are hypoplastic. Visualized orbits and globes normal in appearance. Other: Approximate 1.0 x 0.9 cm partially calcified meningioma arising from the floor of the RIGHT ANTERIOR cranial fossa. IMPRESSION: 1. No acute intracranial abnormality. 2. Mild age related cortical atrophy. 3. Incidental 1.0 cm partially calcified meningioma arising from the floor of the RIGHT ANTERIOR cranial fossa. Electronically Signed   By: Evangeline Dakin M.D.   On: 02/20/2019 19:29   Dg Chest Port 1 View  Result Date: 02/20/2019 CLINICAL DATA:  Chest pain EXAM: PORTABLE CHEST 1 VIEW  COMPARISON:  11/09/2011 FINDINGS: Left subclavian AICD is stable. Normal heart size. Low lung volumes. Bibasilar atelectasis. No pneumothorax. No pleural effusion. IMPRESSION: Bibasilar atelectasis. Electronically Signed   By: Marybelle Killings M.D.   On: 02/20/2019 17:47   Vas US Carotid  Result Date: 02/21/2019 Carotid Arterial Duplex Study Indications:       Syncope and Dizziness. Risk Factors:      Hypertension, hyperlipidemia, coronary artery disease. Comparison Study:  No prior study on file for comparison Performing Technologist: Sharion Dove RVS  Examination Guidelines: Abdirahman Chittum complete evaluation includes B-mode imaging, spectral Doppler, color Doppler, and power Doppler as needed of all accessible portions of each vessel. Bilateral testing is considered an integral part of Tiwanda Threats complete examination. Limited examinations for reoccurring indications may be performed as noted.  Right Carotid Findings:  +----------+--------+--------+--------+--------+------------------+             PSV cm/s EDV cm/s Stenosis Describe Comments            +----------+--------+--------+--------+--------+------------------+  CCA Prox   80       18                         intimal thickening  +----------+--------+--------+--------+--------+------------------+  CCA Distal 78       17                         intimal thickening  +----------+--------+--------+--------+--------+------------------+  ICA Prox   65       14                                             +----------+--------+--------+--------+--------+------------------+  ICA Distal 51       15                                             +----------+--------+--------+--------+--------+------------------+  ECA        60       2                                              +----------+--------+--------+--------+--------+------------------+ +----------+--------+-------+--------+-------------------+             PSV cm/s EDV cms Describe Arm Pressure (mmHG)  +----------+--------+-------+--------+-------------------+  Subclavian 47                                             +----------+--------+-------+--------+-------------------+ +---------+--------+--+--------+--+  Vertebral PSV cm/s 46 EDV cm/s 16  +---------+--------+--+--------+--+  Left Carotid Findings: +----------+--------+--------+--------+-----------+--------+             PSV cm/s EDV cm/s Stenosis Describe    Comments  +----------+--------+--------+--------+-----------+--------+  CCA Prox   80       16                                      +----------+--------+--------+--------+-----------+--------+  CCA Distal 88  20                                      +----------+--------+--------+--------+-----------+--------+  ICA Prox   46       9                 homogeneous           +----------+--------+--------+--------+-----------+--------+  ICA Distal 49       14                                       +----------+--------+--------+--------+-----------+--------+  ECA        58       5                                       +----------+--------+--------+--------+-----------+--------+ +----------+--------+--------+--------+-------------------+  Subclavian PSV cm/s EDV cm/s Describe Arm Pressure (mmHG)  +----------+--------+--------+--------+-------------------+             55                                              +----------+--------+--------+--------+-------------------+ +---------+--------+--+--------+--+  Vertebral PSV cm/s 60 EDV cm/s 11  +---------+--------+--+--------+--+  Summary: Right Carotid: The extracranial vessels were near-normal with only minimal wall                thickening or plaque. Left Carotid: The extracranial vessels were near-normal with only minimal wall               thickening or plaque. Vertebrals:  Bilateral vertebral arteries demonstrate antegrade flow. Subclavians: Normal flow hemodynamics were seen in bilateral subclavian              arteries. *See table(s) above for measurements and observations.  Electronically signed by Monica Martinez MD on 02/21/2019 at 11:55:11 AM.    Final         Scheduled Meds:  aspirin  325 mg Oral Daily   benazepril  10 mg Oral Daily   carvedilol  12.5 mg Oral BID WC   furosemide  10 mg Oral Daily   simvastatin  40 mg Oral q1800   sodium chloride flush  3 mL Intravenous Q12H   sodium chloride flush  3 mL Intravenous Q12H   Continuous Infusions:  sodium chloride     sodium chloride     sodium chloride     [START ON 02/22/2019] sodium chloride     Followed by   Derrill Memo ON 02/22/2019] sodium chloride     heparin 950 Units/hr (02/20/19 1848)     LOS: 0 days    Time spent: over 18 min    Fayrene Helper, MD [facility] Pager AMION  If 7PM-7AM, please contact night-coverage [email or web address] Password [facility] 02/21/2019, 12:55 PM

## 2019-02-21 NOTE — Progress Notes (Signed)
[place] for Heparin Indication: chest pain/ACS  Allergies  Allergen Reactions  . Codeine Anxiety    Patient Measurements: Height: 5\' 8"  (172.7 cm) Weight: 175 lb 1.6 oz (79.4 kg) IBW/kg (Calculated) : 68.4 Heparin Dosing Weight: 78 kg  Vital Signs: Temp: 98.4 F (36.9 C) (04/26 0746) Temp Source: Oral (04/26 0746) BP: 129/73 (04/26 0746) Pulse Rate: 77 (04/26 0746)  Labs: Recent Labs    02/20/19 1721 02/20/19 2156 02/21/19 0226 02/21/19 0913 02/21/19 1015  HGB 14.7  --  14.6  --   --   HCT 44.9  --  43.7  --   --   PLT 138*  --  135*  --   --   HEPARINUNFRC  --   --  0.41  --  0.44  CREATININE 1.55*  --  1.34*  --   --   TROPONINI  --  1.97* 6.79* 9.62*  --     Estimated Creatinine Clearance: 39.7 mL/min (A) (by C-G formula based on SCr of 1.34 mg/dL (H)).   Medical History: Past Medical History:  Diagnosis Date  . Anxiety    Takes Ativan as needed  . Cardiac dysrhythmia, unspecified   . CHF (congestive heart failure) ([place])   . Coronary artery disease    Hx of cardiac caths   . Enlarged prostate   . GERD (gastroesophageal reflux disease)    Takes prilosec  . Gout   . Heart attack ([place])   . HTN (hypertension)   . Hyperkalemia   . Hyperlipemia   . Ischemic cardiomyopathy   . LV dysfunction   . Myocardial infarct ([place]) 1988  . Panic attacks   . Personal history of peptic ulcer disease   . Renal insufficiency   . Syncope and collapse   . Ventricular tachycardia [facility])     Assessment: 33 yoM admitted 4/25 with complaints of chest pain. Pharmacy has been consulted for heparin dosing. Patient not on anticoagulation PTA. Baseline Hgb 14.7, HCT 44.9, pltc 138. On admit patient's Scr 1.55, now down to 1.3  Heparin level remains within goal range.  No overt bleeding or complications noted.  Goal of Therapy:  Heparin level 0.3-0.7 units/ml Monitor platelets by anticoagulation protocol: Yes   Plan:  Cont heparin  at 950 units/hr Daily heparin level and CBC. F/u plans for heparin after cath tomorrow.  Marguerite Olea, [facility] Clinical Pharmacist Phone [phone]  02/21/2019 11:39 AM

## 2019-02-21 NOTE — Progress Notes (Signed)
  Echocardiogram 2D Echocardiogram with Definity has been performed.  Adrian Pace 02/21/2019, 11:07 AM

## 2019-02-21 NOTE — H&P (View-Only) (Signed)
PROGRESS NOTE    URBAN NAVAL  TWS:[id number] DOB: 17-Oct-1933 DOA: 02/20/2019 PCP: Kelton Pillar, MD   Brief Narrative:  Adrian Pace  is Adrian Pace 85 y.o. male,w CAD s/p cath 2003 w PCI LAD/ RCA , CHF (EF 25%), s/p ICD revision 2013, presents with c/o dizziness after supper, walked upstairs to bathroom continued to be dizzy with concomitant chest tightness, substernal without radiation.  Pt denies fever, chills, palp, sob, n/v, abd pain, diarrhea, brbpr, dysuria, lower ext edema.  Pt called EMS cause the dizziness (no vertigo) was not subsiding.  Pt states EMS gave him 4 baby aspirin but no nitro.  The dizziness and chest tightness resolved en route to ER. Note patient was bradycardic en route hr 48 ?  In ED,  T 97.5  P 75  Bp 129/63  Pox 93-98% on RA Wt 78kg  CT brain IMPRESSION: 1. No acute intracranial abnormality. 2. Mild age related cortical atrophy. 3. Incidental 1.0 cm partially calcified meningioma arising from the floor of the RIGHT ANTERIOR cranial fossa.  CXR IMPRESSION: Bibasilar atelectasis.  Wbc 5.9, Hgb 14.7, Plt 138 Na 140, K 4.6,  Bun 15, Creatinine 1.55 Glucose 120 Trop 0.01  EKG nsr at 70, LAD, nl int, t inversion in 1, avl, v5,6,  q in v1,2 and hint of st elevation in v1-3  No current chest pain at this time.  Pt seen by cardiology in ED, per ED, recommended heparin gtt Pt will be admitted for chest pain and dizziness  Assessment & Plan:   Principal Problem:   Chest pain Active Problems:   Essential hypertension   Anxiety   CAD (coronary artery disease)   Dizziness   Thrombocytopenia (HCC)   Meningioma (HCC)   NSTEMI   Chest pain, hx of CAD s/p PCI Lad/ Rca , CHF (EF 25-30%) Appreciate cards assistance, planning for [facility] 4/27 NPO after midnight Follow troponins (9.62 today) LDL 50, A1c 5.8 EKG appears c/w priors Echo pending  Heparin gtt Continue ASA, statin, ACE, carvedilol (dose decreased in setting of  bradycardia)  Bradycardia Decrease Carvedilol as above  Dizziness Seems resolved today Head CT without acute abnormality Will continue to monitor   ARF, mild Improved, baseline creatinine appears to be around 1.3  Anxiety   Insomnia Cont lorazepam 0.5mg  po qday prn Will add ambien qhs prn for sleep  Thrombocytopenia Mild, follow   DVT prophylaxis: heparin gtt Code Status: (full  Family Communication: none at bedside Disposition Plan: pending  Consultants:   cardiology  Procedures:   none  Antimicrobials: Anti-infectives (From admission, onward)   None         Subjective: Denies CP or SOB. Symptoms improved after EMS.  Objective: Vitals:   02/21/19 0004 02/21/19 0434 02/21/19 0746 02/21/19 1200  BP: 131/81 130/77 129/73   Pulse: 95 86 77 78  Resp: 11 14 17 12   Temp:  99 F (37.2 C) 98.4 F (36.9 C)   TempSrc:  Oral Oral   SpO2: 92% 95% 94% 94%  Weight:  79.4 kg  79.2 kg  Height:        Intake/Output Summary (Last 24 hours) at 02/21/2019 1255 Last data filed at 02/21/2019 0600 Gross per 24 hour  Intake 485.75 ml  Output 350 ml  Net 135.75 ml   Filed Weights   02/20/19 2017 02/21/19 0434 02/21/19 1200  Weight: 81.1 kg 79.4 kg 79.2 kg    Examination:  General exam: Appears calm and comfortable  Respiratory system: Clear to auscultation.  Respiratory effort normal. Cardiovascular system: S1 & S2 heard, RRR.  Gastrointestinal system: Abdomen is nondistended, soft and nontender.  Central nervous system: Alert and oriented. No focal neurological deficits. Extremities: no lee Skin: No rashes, lesions or ulcers Psychiatry: Judgement and insight appear normal. Mood & affect appropriate.     Data Reviewed: I have personally reviewed following labs and imaging studies  CBC: Recent Labs  Lab 02/20/19 1721 02/21/19 0226  WBC 5.9 8.0  HGB 14.7 14.6  HCT 44.9 43.7  MCV 89.8 87.6  PLT 138* 193*   Basic Metabolic Panel: Recent Labs   Lab 02/20/19 1721 02/21/19 0226  NA 140 141  K 4.6 4.2  CL 107 111  CO2 23 23  GLUCOSE 120* 113*  BUN 15 15  CREATININE 1.55* 1.34*  CALCIUM 8.5* 8.7*   GFR: Estimated Creatinine Clearance: 39.7 mL/min (Adrian Pace) (by C-G formula based on SCr of 1.34 mg/dL (H)). Liver Function Tests: Recent Labs  Lab 02/21/19 0226  AST 53*  ALT 21  ALKPHOS 70  BILITOT 0.6  PROT 5.9*  ALBUMIN 3.3*   No results for input(s): LIPASE, AMYLASE in the last 168 hours. No results for input(s): AMMONIA in the last 168 hours. Coagulation Profile: No results for input(s): INR, PROTIME in the last 168 hours. Cardiac Enzymes: Recent Labs  Lab 02/20/19 2156 02/21/19 0226 02/21/19 0913  TROPONINI 1.97* 6.79* 9.62*   BNP (last 3 results) No results for input(s): PROBNP in the last 8760 hours. HbA1C: Recent Labs    02/21/19 0226  HGBA1C 5.8*   CBG: No results for input(s): GLUCAP in the last 168 hours. Lipid Profile: Recent Labs    02/21/19 0226  CHOL 95  HDL 28*  LDLCALC 50  TRIG 83  CHOLHDL 3.4   Thyroid Function Tests: Recent Labs    02/21/19 0226  TSH 0.534   Anemia Panel: No results for input(s): VITAMINB12, FOLATE, FERRITIN, TIBC, IRON, RETICCTPCT in the last 72 hours. Sepsis Labs: No results for input(s): PROCALCITON, LATICACIDVEN in the last 168 hours.  No results found for this or any previous visit (from the past 240 hour(s)).       Radiology Studies: Ct Head Wo Contrast  Result Date: 02/20/2019 CLINICAL DATA:  85 year old with Adrian Pace near syncopal episode while watching television earlier this evening. EXAM: CT HEAD WITHOUT CONTRAST TECHNIQUE: Contiguous axial images were obtained from the base of the skull through the vertex without intravenous contrast. COMPARISON:  None. FINDINGS: Brain: Mild age related cortical atrophy. Asymmetry in the size of the LATERAL ventricles, RIGHT greater than LEFT, is felt to be developmental. No mass lesion. No midline shift. No acute  hemorrhage or hematoma. No extra-axial fluid collections. No evidence of acute infarction. Vascular: Moderate BILATERAL carotid siphon and BILATERAL vertebral artery atherosclerosis. No hyperdense vessel. Skull: No skull fracture or other focal osseous abnormality involving the skull. Sinuses/Orbits: Visualized paranasal sinuses, bilateral mastoid air cells and bilateral middle ear cavities well-aerated. Frontal sinuses are hypoplastic. Visualized orbits and globes normal in appearance. Other: Approximate 1.0 x 0.9 cm partially calcified meningioma arising from the floor of the RIGHT ANTERIOR cranial fossa. IMPRESSION: 1. No acute intracranial abnormality. 2. Mild age related cortical atrophy. 3. Incidental 1.0 cm partially calcified meningioma arising from the floor of the RIGHT ANTERIOR cranial fossa. Electronically Signed   By: Evangeline Dakin M.D.   On: 02/20/2019 19:29   Dg Chest Port 1 View  Result Date: 02/20/2019 CLINICAL DATA:  Chest pain EXAM: PORTABLE CHEST 1 VIEW  COMPARISON:  11/09/2011 FINDINGS: Left subclavian AICD is stable. Normal heart size. Low lung volumes. Bibasilar atelectasis. No pneumothorax. No pleural effusion. IMPRESSION: Bibasilar atelectasis. Electronically Signed   By: Marybelle Killings M.D.   On: 02/20/2019 17:47   Vas US Carotid  Result Date: 02/21/2019 Carotid Arterial Duplex Study Indications:       Syncope and Dizziness. Risk Factors:      Hypertension, hyperlipidemia, coronary artery disease. Comparison Study:  No prior study on file for comparison Performing Technologist: Sharion Dove RVS  Examination Guidelines: Myrtie Leuthold complete evaluation includes B-mode imaging, spectral Doppler, color Doppler, and power Doppler as needed of all accessible portions of each vessel. Bilateral testing is considered an integral part of Adrian Pace complete examination. Limited examinations for reoccurring indications may be performed as noted.  Right Carotid Findings:  +----------+--------+--------+--------+--------+------------------+             PSV cm/s EDV cm/s Stenosis Describe Comments            +----------+--------+--------+--------+--------+------------------+  CCA Prox   80       18                         intimal thickening  +----------+--------+--------+--------+--------+------------------+  CCA Distal 78       17                         intimal thickening  +----------+--------+--------+--------+--------+------------------+  ICA Prox   65       14                                             +----------+--------+--------+--------+--------+------------------+  ICA Distal 51       15                                             +----------+--------+--------+--------+--------+------------------+  ECA        60       2                                              +----------+--------+--------+--------+--------+------------------+ +----------+--------+-------+--------+-------------------+             PSV cm/s EDV cms Describe Arm Pressure (mmHG)  +----------+--------+-------+--------+-------------------+  Subclavian 47                                             +----------+--------+-------+--------+-------------------+ +---------+--------+--+--------+--+  Vertebral PSV cm/s 46 EDV cm/s 16  +---------+--------+--+--------+--+  Left Carotid Findings: +----------+--------+--------+--------+-----------+--------+             PSV cm/s EDV cm/s Stenosis Describe    Comments  +----------+--------+--------+--------+-----------+--------+  CCA Prox   80       16                                      +----------+--------+--------+--------+-----------+--------+  CCA Distal 88  20                                      +----------+--------+--------+--------+-----------+--------+  ICA Prox   46       9                 homogeneous           +----------+--------+--------+--------+-----------+--------+  ICA Distal 49       14                                       +----------+--------+--------+--------+-----------+--------+  ECA        58       5                                       +----------+--------+--------+--------+-----------+--------+ +----------+--------+--------+--------+-------------------+  Subclavian PSV cm/s EDV cm/s Describe Arm Pressure (mmHG)  +----------+--------+--------+--------+-------------------+             55                                              +----------+--------+--------+--------+-------------------+ +---------+--------+--+--------+--+  Vertebral PSV cm/s 60 EDV cm/s 11  +---------+--------+--+--------+--+  Summary: Right Carotid: The extracranial vessels were near-normal with only minimal wall                thickening or plaque. Left Carotid: The extracranial vessels were near-normal with only minimal wall               thickening or plaque. Vertebrals:  Bilateral vertebral arteries demonstrate antegrade flow. Subclavians: Normal flow hemodynamics were seen in bilateral subclavian              arteries. *See table(s) above for measurements and observations.  Electronically signed by Monica Martinez MD on 02/21/2019 at 11:55:11 AM.    Final         Scheduled Meds:  aspirin  325 mg Oral Daily   benazepril  10 mg Oral Daily   carvedilol  12.5 mg Oral BID WC   furosemide  10 mg Oral Daily   simvastatin  40 mg Oral q1800   sodium chloride flush  3 mL Intravenous Q12H   sodium chloride flush  3 mL Intravenous Q12H   Continuous Infusions:  sodium chloride     sodium chloride     sodium chloride     [START ON 02/22/2019] sodium chloride     Followed by   Derrill Memo ON 02/22/2019] sodium chloride     heparin 950 Units/hr (02/20/19 1848)     LOS: 0 days    Time spent: over 77 min    Fayrene Helper, MD [facility] Pager AMION  If 7PM-7AM, please contact night-coverage [email or web address] Password [facility] 02/21/2019, 12:55 PM

## 2019-02-21 NOTE — Progress Notes (Signed)
VASCULAR LAB PRELIMINARY  PRELIMINARY  PRELIMINARY  PRELIMINARY  Carotid duplex completed.    Preliminary report:  See CV proc for results.  Jeannelle Wiens, RVT 02/21/2019, 11:06 AM

## 2019-02-21 NOTE — Progress Notes (Signed)
[name] for Heparin Indication: chest pain/ACS  Allergies  Allergen Reactions  . Codeine Anxiety    Patient Measurements: Height: 5\' 8"  (172.7 cm) Weight: 178 lb 11.2 oz (81.1 kg) IBW/kg (Calculated) : 68.4 Heparin Dosing Weight: 78 kg  Vital Signs: Temp: 98.5 F (36.9 C) (04/25 2021) Temp Source: Oral (04/25 2021) BP: 131/81 (04/26 0004) Pulse Rate: 95 (04/26 0004)  Labs: Recent Labs    02/20/19 1721 02/20/19 2156 02/21/19 0226  HGB 14.7  --  14.6  HCT 44.9  --  43.7  PLT 138*  --  135*  HEPARINUNFRC  --   --  0.41  CREATININE 1.55*  --   --   TROPONINI  --  1.97*  --     Estimated Creatinine Clearance: 34.3 mL/min (A) (by C-G formula based on SCr of 1.55 mg/dL (H)).   Medical History: Past Medical History:  Diagnosis Date  . Anxiety    Takes Ativan as needed  . Cardiac dysrhythmia, unspecified   . CHF (congestive heart failure) ([place])   . Coronary artery disease    Hx of cardiac caths   . Enlarged prostate   . GERD (gastroesophageal reflux disease)    Takes prilosec  . Gout   . Heart attack ([place])   . HTN (hypertension)   . Hyperkalemia   . Hyperlipemia   . Ischemic cardiomyopathy   . LV dysfunction   . Myocardial infarct ([place]) 1988  . Panic attacks   . Personal history of peptic ulcer disease   . Renal insufficiency   . Syncope and collapse   . Ventricular tachycardia [facility])     Assessment: Adrian Pace admitted 4/25 with complaints of chest pain. Pharmacy has been consulted for heparin dosing. Patient not on anticoagulation PTA. Baseline Hgb 14.7, HCT 44.9, pltc 138. On admit patient's Scr 1.55.  4/26 AM update: initial heparin level therapeutic, CBC stable  Goal of Therapy:  Heparin level 0.3-0.7 units/ml Monitor platelets by anticoagulation protocol: Yes   Plan:  Cont heparin at 950 units/hr Confirmatory heparin level in 8 hours  Narda Bonds, PharmD, [place] Pharmacist Phone: [phone]

## 2019-02-21 NOTE — Progress Notes (Signed)
Progress Note  Patient Name: Adrian Pace Date of Encounter: 02/21/2019  Primary Cardiologist: Kirk Ruths, MD   Subjective   He is having no chest pain or shortness of breath.  His main complaint is that he did not sleep last night.  He usually takes Ativan at night to sleep as well as Ativan throughout the day due to anxiety and claustrophobia.  He is feeling quite claustrophobic at the moment.  Inpatient Medications    Scheduled Meds:  aspirin  325 mg Oral Daily   benazepril  10 mg Oral Daily   carvedilol  12.5 mg Oral BID WC   furosemide  10 mg Oral Daily   simvastatin  40 mg Oral q1800   sodium chloride flush  3 mL Intravenous Q12H   Continuous Infusions:  sodium chloride     sodium chloride     heparin 950 Units/hr (02/20/19 1848)   PRN Meds: sodium chloride, acetaminophen **OR** acetaminophen, LORazepam, sodium chloride flush   Vital Signs    Vitals:   02/20/19 2021 02/21/19 0004 02/21/19 0434 02/21/19 0746  BP: 135/87 131/81 130/77 129/73  Pulse: 97 95 86 77  Resp:  11 14 17   Temp: 98.5 F (36.9 C)  99 F (37.2 C) 98.4 F (36.9 C)  TempSrc: Oral  Oral Oral  SpO2: 94% 92% 95% 94%  Weight:   79.4 kg   Height:        Intake/Output Summary (Last 24 hours) at 02/21/2019 0843 Last data filed at 02/21/2019 0600 Gross per 24 hour  Intake 485.75 ml  Output 350 ml  Net 135.75 ml   Last 3 Weights 02/21/2019 02/20/2019 02/20/2019  Weight (lbs) 175 lb 1.6 oz 178 lb 11.2 oz 172 lb  Weight (kg) 79.425 kg 81.058 kg 78.019 kg      Telemetry    Sinus rhythm- Personally Reviewed  ECG    Sinus rhythm- Personally Reviewed  Physical Exam   GEN: No acute distress.   Neck: No JVD Cardiac: RRR, no murmurs, rubs, or gallops.  Respiratory: Clear to auscultation bilaterally. GI: Soft, nontender, non-distended  MS: No edema; No deformity. Neuro:  Nonfocal  Psych: Normal affect   Labs    Chemistry Recent Labs  Lab 02/20/19 1721 02/21/19 0226    NA 140 141  K 4.6 4.2  CL 107 111  CO2 23 23  GLUCOSE 120* 113*  BUN 15 15  CREATININE 1.55* 1.34*  CALCIUM 8.5* 8.7*  PROT  --  5.9*  ALBUMIN  --  3.3*  AST  --  53*  ALT  --  21  ALKPHOS  --  70  BILITOT  --  0.6  GFRNONAA 41* 48*  GFRAA 47* 56*  ANIONGAP 10 7     Hematology Recent Labs  Lab 02/20/19 1721 02/21/19 0226  WBC 5.9 8.0  RBC 5.00 4.99  HGB 14.7 14.6  HCT 44.9 43.7  MCV 89.8 87.6  MCH 29.4 29.3  MCHC 32.7 33.4  RDW 13.2 13.3  PLT 138* 135*    Cardiac Enzymes Recent Labs  Lab 02/20/19 2156 02/21/19 0226  TROPONINI 1.97* 6.79*    Recent Labs  Lab 02/20/19 1724  TROPIPOC 0.01     BNPNo results for input(s): BNP, PROBNP in the last 168 hours.   DDimer No results for input(s): DDIMER in the last 168 hours.   Radiology    Ct Head Wo Contrast  Result Date: 02/20/2019 CLINICAL DATA:  85 year old with a near syncopal episode  while watching television earlier this evening. EXAM: CT HEAD WITHOUT CONTRAST TECHNIQUE: Contiguous axial images were obtained from the base of the skull through the vertex without intravenous contrast. COMPARISON:  None. FINDINGS: Brain: Mild age related cortical atrophy. Asymmetry in the size of the LATERAL ventricles, RIGHT greater than LEFT, is felt to be developmental. No mass lesion. No midline shift. No acute hemorrhage or hematoma. No extra-axial fluid collections. No evidence of acute infarction. Vascular: Moderate BILATERAL carotid siphon and BILATERAL vertebral artery atherosclerosis. No hyperdense vessel. Skull: No skull fracture or other focal osseous abnormality involving the skull. Sinuses/Orbits: Visualized paranasal sinuses, bilateral mastoid air cells and bilateral middle ear cavities well-aerated. Frontal sinuses are hypoplastic. Visualized orbits and globes normal in appearance. Other: Approximate 1.0 x 0.9 cm partially calcified meningioma arising from the floor of the RIGHT ANTERIOR cranial fossa. IMPRESSION: 1.  No acute intracranial abnormality. 2. Mild age related cortical atrophy. 3. Incidental 1.0 cm partially calcified meningioma arising from the floor of the RIGHT ANTERIOR cranial fossa. Electronically Signed   By: Evangeline Dakin M.D.   On: 02/20/2019 19:29   Dg Chest Port 1 View  Result Date: 02/20/2019 CLINICAL DATA:  Chest pain EXAM: PORTABLE CHEST 1 VIEW COMPARISON:  11/09/2011 FINDINGS: Left subclavian AICD is stable. Normal heart size. Low lung volumes. Bibasilar atelectasis. No pneumothorax. No pleural effusion. IMPRESSION: Bibasilar atelectasis. Electronically Signed   By: Marybelle Killings M.D.   On: 02/20/2019 17:47    Cardiac Studies   Echo pending  Patient Profile     85 y.o. male who presented to the hospital with chest pain, shortness of breath, nausea, diaphoresis found to be bradycardic and hypotensive now with non-STEMI.  Assessment & Plan    1.  Non-STEMI: Certainly has an ischemic cardiomyopathy.  Has had PCI of his LAD in 2003.  Troponin has risen to 6 this admission.  Due to that, we Dennise Raabe plan for left heart catheterization.  We Ralphie Lovelady keep him n.p.o. after midnight.  We Aedon Deason continue heparin.  The patient understands that risks include but are not limited to stroke (1 in 1000), death (1 in 45), kidney failure [usually temporary] (1 in 500), bleeding (1 in 200), allergic reaction [possibly serious] (1 in 200), and agrees to proceed.  2.  Ischemic cardiomyopathy: At this point there is no obvious sign of volume overload.  Continue with current home medications.  3.  Hyperlipidemia: Continue Zocor.  Goal LDL less than 70.  4.  Hypertension: New home medications.  5.  Anxiety: Has PRN Ativan ordered.  He may need something more for sleep tonight.  Plan per primary team.     For questions or updates, please contact [place] Please consult [email or web address] for contact info under        Signed, Faith Branan Meredith Leeds, MD  02/21/2019, 8:43 AM

## 2019-02-22 ENCOUNTER — Encounter (HOSPITAL_COMMUNITY)
Admission: EM | Disposition: A | Discharge status: Home / Self Care | Payer: Self-pay | Source: Home / Self Care | Attending: Family Medicine

## 2019-02-22 DIAGNOSIS — I2510 Atherosclerotic heart disease of native coronary artery without angina pectoris: Secondary | ICD-10-CM

## 2019-02-22 HISTORY — PX: LEFT HEART CATH AND CORONARY ANGIOGRAPHY: CATH118249

## 2019-02-22 HISTORY — PX: CORONARY STENT INTERVENTION: CATH118234

## 2019-02-22 LAB — CBC
HCT: 46.8 % (ref 39.0–52.0)
Hemoglobin: 15.5 g/dL (ref 13.0–17.0)
MCH: 29 pg (ref 26.0–34.0)
MCHC: 33.1 g/dL (ref 30.0–36.0)
MCV: 87.6 fL (ref 80.0–100.0)
Platelets: 127 10*3/uL — ABNORMAL LOW (ref 150–400)
RBC: 5.34 MIL/uL (ref 4.22–5.81)
RDW: 13.4 % (ref 11.5–15.5)
WBC: 7.1 10*3/uL (ref 4.0–10.5)
nRBC: 0 % (ref 0.0–0.2)

## 2019-02-22 LAB — MAGNESIUM: Magnesium: 2 mg/dL (ref 1.7–2.4)

## 2019-02-22 LAB — COMPREHENSIVE METABOLIC PANEL
ALT: 19 U/L (ref 0–44)
ALT: 17 U/L (ref 0–44)
AST: 44 U/L — ABNORMAL HIGH (ref 15–41)
AST: 34 U/L (ref 15–41)
Albumin: 3.4 g/dL — ABNORMAL LOW (ref 3.5–5.0)
Albumin: 3.7 g/dL (ref 3.5–5.0)
Alkaline Phosphatase: 73 U/L (ref 38–126)
Alkaline Phosphatase: 73 U/L (ref 38–126)
Anion gap: 9 (ref 5–15)
Anion gap: 4 — ABNORMAL LOW (ref 5–15)
BUN: 13 mg/dL (ref 8–23)
BUN: 15 mg/dL (ref 8–23)
CO2: 22 mmol/L (ref 22–32)
CO2: 30 mmol/L (ref 22–32)
Calcium: 8.8 mg/dL — ABNORMAL LOW (ref 8.9–10.3)
Calcium: 9.2 mg/dL (ref 8.9–10.3)
Chloride: 108 mmol/L (ref 98–111)
Chloride: 106 mmol/L (ref 98–111)
Creatinine, Ser: 1.27 mg/dL — ABNORMAL HIGH (ref 0.61–1.24)
Creatinine, Ser: 1.36 mg/dL — ABNORMAL HIGH (ref 0.61–1.24)
GFR calc Af Amer: 60 mL/min — ABNORMAL LOW (ref >60)
GFR calc Af Amer: 55 mL/min — ABNORMAL LOW (ref >60)
GFR calc non Af Amer: 52 mL/min — ABNORMAL LOW (ref >60)
GFR calc non Af Amer: 47 mL/min — ABNORMAL LOW (ref >60)
Glucose, Bld: 99 mg/dL (ref 70–99)
Glucose, Bld: 106 mg/dL — ABNORMAL HIGH (ref 70–99)
Potassium: 4.2 mmol/L (ref 3.5–5.1)
Potassium: 4.3 mmol/L (ref 3.5–5.1)
Sodium: 139 mmol/L (ref 135–145)
Sodium: 140 mmol/L (ref 135–145)
Total Bilirubin: 0.9 mg/dL (ref 0.3–1.2)
Total Bilirubin: 1 mg/dL (ref 0.3–1.2)
Total Protein: 6.2 g/dL — ABNORMAL LOW (ref 6.5–8.1)
Total Protein: 6.8 g/dL (ref 6.5–8.1)

## 2019-02-22 LAB — HEPARIN LEVEL (UNFRACTIONATED): Heparin Unfractionated: 0.48 IU/mL (ref 0.30–0.70)

## 2019-02-22 LAB — POCT ACTIVATED CLOTTING TIME: Activated Clotting Time: 345 seconds

## 2019-02-22 SURGERY — LEFT HEART CATH AND CORONARY ANGIOGRAPHY
Anesthesia: LOCAL

## 2019-02-22 MED ORDER — LORAZEPAM 2 MG/ML IJ SOLN
1.0000 mg | Freq: Once | INTRAMUSCULAR | Status: AC
  Administered 2019-02-22: 13:00:00 1 mg via INTRAVENOUS

## 2019-02-22 MED ORDER — MIDAZOLAM HCL 2 MG/2ML IJ SOLN
INTRAMUSCULAR | Status: AC
  Filled 2019-02-22: qty 2

## 2019-02-22 MED ORDER — IOHEXOL 350 MG/ML SOLN
INTRAVENOUS | Status: DC | PRN
  Administered 2019-02-22: 09:00:00 110 mL via INTRA_ARTERIAL

## 2019-02-22 MED ORDER — HEPARIN (PORCINE) IN NACL 1000-0.9 UT/500ML-% IV SOLN
INTRAVENOUS | Status: AC
  Filled 2019-02-22: qty 500

## 2019-02-22 MED ORDER — TICAGRELOR 90 MG PO TABS
ORAL_TABLET | ORAL | Status: AC
  Filled 2019-02-22: qty 1

## 2019-02-22 MED ORDER — HEPARIN (PORCINE) IN NACL 1000-0.9 UT/500ML-% IV SOLN
INTRAVENOUS | Status: DC | PRN
  Administered 2019-02-22 (×2): 500 mL

## 2019-02-22 MED ORDER — TICAGRELOR 90 MG PO TABS
ORAL_TABLET | ORAL | Status: DC | PRN
  Administered 2019-02-22: 180 mg via ORAL

## 2019-02-22 MED ORDER — VERAPAMIL HCL 2.5 MG/ML IV SOLN
INTRAVENOUS | Status: AC
  Filled 2019-02-22: qty 2

## 2019-02-22 MED ORDER — BIVALIRUDIN TRIFLUOROACETATE 250 MG IV SOLR
INTRAVENOUS | Status: AC
  Filled 2019-02-22: qty 250

## 2019-02-22 MED ORDER — SODIUM CHLORIDE 0.9 % IV SOLN: 250.0000 mL | INTRAVENOUS | Status: DC | PRN

## 2019-02-22 MED ORDER — SODIUM CHLORIDE 0.9 % IV SOLN
INTRAVENOUS | Status: AC | PRN
  Administered 2019-02-22: 08:00:00 1.75 mg/kg/h via INTRAVENOUS

## 2019-02-22 MED ORDER — MIDAZOLAM HCL 2 MG/2ML IJ SOLN
INTRAMUSCULAR | Status: DC | PRN
  Administered 2019-02-22 (×2): 1 mg via INTRAVENOUS

## 2019-02-22 MED ORDER — LIDOCAINE HCL (PF) 1 % IJ SOLN
INTRAMUSCULAR | Status: DC | PRN
  Administered 2019-02-22: 2 mL
  Administered 2019-02-22: 18 mL

## 2019-02-22 MED ORDER — SODIUM CHLORIDE 0.9% FLUSH: 3.0000 mL | INTRAVENOUS | Status: DC | PRN

## 2019-02-22 MED ORDER — TICAGRELOR 90 MG PO TABS
90.0000 mg | ORAL_TABLET | Freq: Two times a day (BID) | ORAL | Status: DC
  Administered 2019-02-22 – 2019-02-23 (×2): 90 mg via ORAL
  Filled 2019-02-22 (×2): qty 1

## 2019-02-22 MED ORDER — LABETALOL HCL 5 MG/ML IV SOLN: 10.0000 mg | INTRAVENOUS | Status: AC | PRN

## 2019-02-22 MED ORDER — ACETAMINOPHEN 325 MG PO TABS: 650.0000 mg | ORAL_TABLET | ORAL | Status: DC | PRN

## 2019-02-22 MED ORDER — FENTANYL CITRATE (PF) 100 MCG/2ML IJ SOLN
INTRAMUSCULAR | Status: DC | PRN
  Administered 2019-02-22: 25 ug via INTRAVENOUS

## 2019-02-22 MED ORDER — BIVALIRUDIN BOLUS VIA INFUSION - CUPID
INTRAVENOUS | Status: DC | PRN
  Administered 2019-02-22: 08:00:00 59.025 mg via INTRAVENOUS

## 2019-02-22 MED ORDER — LORAZEPAM 2 MG/ML IJ SOLN
INTRAMUSCULAR | Status: AC
  Filled 2019-02-22: qty 1

## 2019-02-22 MED ORDER — ONDANSETRON HCL 4 MG/2ML IJ SOLN: 4.0000 mg | Freq: Four times a day (QID) | INTRAMUSCULAR | Status: DC | PRN

## 2019-02-22 MED ORDER — LIDOCAINE HCL (PF) 1 % IJ SOLN
INTRAMUSCULAR | Status: AC
  Filled 2019-02-22: qty 30

## 2019-02-22 MED ORDER — VERAPAMIL HCL 2.5 MG/ML IV SOLN
INTRAVENOUS | Status: DC | PRN
  Administered 2019-02-22: 08:00:00 10 mL via INTRA_ARTERIAL

## 2019-02-22 MED ORDER — HYDRALAZINE HCL 20 MG/ML IJ SOLN: 10.0000 mg | INTRAMUSCULAR | Status: AC | PRN

## 2019-02-22 MED ORDER — SODIUM CHLORIDE 0.9 % IV SOLN: INTRAVENOUS | Status: AC

## 2019-02-22 MED ORDER — ASPIRIN 81 MG PO CHEW
81.0000 mg | CHEWABLE_TABLET | Freq: Every day | ORAL | Status: DC
  Administered 2019-02-23: 81 mg via ORAL
  Filled 2019-02-22: qty 1

## 2019-02-22 MED ORDER — LORAZEPAM 0.5 MG PO TABS
ORAL_TABLET | ORAL | Status: AC
  Filled 2019-02-22: qty 1

## 2019-02-22 MED ORDER — SODIUM CHLORIDE 0.9% FLUSH
3.0000 mL | Freq: Two times a day (BID) | INTRAVENOUS | Status: DC
  Administered 2019-02-22 – 2019-02-23 (×2): 3 mL via INTRAVENOUS

## 2019-02-22 MED ORDER — FENTANYL CITRATE (PF) 100 MCG/2ML IJ SOLN
INTRAMUSCULAR | Status: AC
  Filled 2019-02-22: qty 2

## 2019-02-22 MED FILL — Perflutren Lipid Microsphere IV Susp 1.1 MG/ML: INTRAVENOUS | Qty: 10 | Status: AC

## 2019-02-22 SURGICAL SUPPLY — 25 items
BALLN EMERGE MR 2.5X15 (BALLOONS) ×2
BALLN SAPPHIRE ~~LOC~~ 3.5X12 (BALLOONS) ×2 IMPLANT
BALLOON EMERGE MR 2.5X15 (BALLOONS) ×1 IMPLANT
CATH 5FR JL3.5 JR4 ANG PIG MP (CATHETERS) ×2 IMPLANT
CATH INFINITI 5 FR 3DRC (CATHETERS) ×2 IMPLANT
CATH INFINITI 5FR AL1 (CATHETERS) ×2 IMPLANT
CATH INFINITI 6F FL5 (CATHETERS) ×2 IMPLANT
CATH LAUNCHER 6FR JR4 (CATHETERS) ×2 IMPLANT
COVER DOME SNAP 22 D (MISCELLANEOUS) ×2 IMPLANT
DEVICE RAD COMP TR BAND LRG (VASCULAR PRODUCTS) ×2 IMPLANT
GLIDESHEATH SLEND SS 6F .021 (SHEATH) ×2 IMPLANT
GUIDEWIRE INQWIRE 1.5J.035X260 (WIRE) ×1 IMPLANT
INQWIRE 1.5J .035X260CM (WIRE) ×2
KIT ENCORE 26 ADVANTAGE (KITS) ×2 IMPLANT
KIT HEART LEFT (KITS) ×2 IMPLANT
KIT HEMO VALVE WATCHDOG (MISCELLANEOUS) ×2 IMPLANT
PACK CARDIAC CATHETERIZATION (CUSTOM PROCEDURE TRAY) ×2 IMPLANT
SHEATH PINNACLE 5F 10CM (SHEATH) ×2 IMPLANT
SHEATH PINNACLE 6F 10CM (SHEATH) ×2 IMPLANT
SHEATH PROBE COVER 6X72 (BAG) ×2 IMPLANT
STENT SYNERGY DES 3X20 (Permanent Stent) ×2 IMPLANT
TRANSDUCER W/STOPCOCK (MISCELLANEOUS) ×2 IMPLANT
TUBING CIL FLEX 10 FLL-RA (TUBING) ×2 IMPLANT
WIRE ASAHI PROWATER 180CM (WIRE) ×2 IMPLANT
WIRE EMERALD 3MM-J .035X150CM (WIRE) ×2 IMPLANT

## 2019-02-22 NOTE — Interval H&P Note (Signed)
Cath Lab Visit (complete for each Cath Lab visit)  Clinical Evaluation Leading to the Procedure:   ACS: Yes.    Non-ACS:    Anginal Classification: CCS IV  Anti-ischemic medical therapy: Minimal Therapy (1 class of medications)  Non-Invasive Test Results: No non-invasive testing performed  Prior CABG: No previous CABG      History and Physical Interval Note:  02/22/2019 7:39 AM  Adrian Pace  has presented today for surgery, with the diagnosis of NSTEMI.  The various methods of treatment have been discussed with the patient and family. After consideration of risks, benefits and other options for treatment, the patient has consented to  Procedure(s): LEFT HEART CATH AND CORONARY ANGIOGRAPHY (N/A) as a surgical intervention.  The patient's history has been reviewed, patient examined, no change in status, stable for surgery.  I have reviewed the patient's chart and labs.  Questions were answered to the patient's satisfaction.     Larae Grooms

## 2019-02-22 NOTE — Progress Notes (Signed)
Progress Note  Patient Name: Adrian Pace Date of Encounter: 02/22/2019  Primary Cardiologist: Kirk Ruths, MD   Subjective   No recurrent chest pain or shortness of breath.  The patient is feeling anxious.  Inpatient Medications    Scheduled Meds: . aspirin  81 mg Oral Daily  . benazepril  10 mg Oral Daily  . carvedilol  12.5 mg Oral BID WC  . simvastatin  40 mg Oral q1800  . sodium chloride flush  3 mL Intravenous Q12H  . sodium chloride flush  3 mL Intravenous Q12H  . ticagrelor  90 mg Oral BID   Continuous Infusions: . sodium chloride    . sodium chloride    . sodium chloride 75 mL/hr at 02/22/19 0942  . sodium chloride     PRN Meds: sodium chloride, sodium chloride, acetaminophen **OR** acetaminophen, acetaminophen, hydrALAZINE, labetalol, LORazepam, ondansetron (ZOFRAN) IV, sodium chloride flush, sodium chloride flush, zolpidem   Vital Signs    Vitals:   02/22/19 0840 02/22/19 0845 02/22/19 0908 02/22/19 1133  BP: 114/64 (!) 122/57 (!) 126/54 125/66  Pulse: 77 74 76 70  Resp: 16 (!) 9 20 15   Temp:      TempSrc:      SpO2: 97% 98%    Weight:      Height:        Intake/Output Summary (Last 24 hours) at 02/22/2019 1309 Last data filed at 02/22/2019 0519 Gross per 24 hour  Intake 791 ml  Output 200 ml  Net 591 ml   Last 3 Weights 02/22/2019 02/21/2019 02/21/2019  Weight (lbs) 173 lb 6.4 oz 174 lb 9.6 oz 175 lb 1.6 oz  Weight (kg) 78.654 kg 79.198 kg 79.425 kg      Telemetry    Normal sinus rhythm- Personally Reviewed   Physical Exam  Alert, oriented, elderly male resting comfortably in bed GEN: No acute distress.   Neck: No JVD Cardiac: RRR, no murmurs, rubs, or gallops.  Respiratory: Clear to auscultation bilaterally. GI: Soft, nontender, non-distended  MS: No edema; No deformity.  Right radial site with TR band in place.  Right groin site clear after sheath removal. Neuro:  Nonfocal  Psych: Normal affect   Labs    Chemistry Recent  Labs  Lab 02/20/19 1721 02/21/19 0226 02/22/19 0447  NA 140 141 139  K 4.6 4.2 4.2  CL 107 111 108  CO2 23 23 22   GLUCOSE 120* 113* 99  BUN 15 15 13   CREATININE 1.55* 1.34* 1.27*  CALCIUM 8.5* 8.7* 8.8*  PROT  --  5.9* 6.2*  ALBUMIN  --  3.3* 3.4*  AST  --  53* 44*  ALT  --  21 19  ALKPHOS  --  70 73  BILITOT  --  0.6 0.9  GFRNONAA 41* 48* 52*  GFRAA 47* 56* 60*  ANIONGAP 10 7 9      Hematology Recent Labs  Lab 02/20/19 1721 02/21/19 0226 02/22/19 0447  WBC 5.9 8.0 7.1  RBC 5.00 4.99 5.34  HGB 14.7 14.6 15.5  HCT 44.9 43.7 46.8  MCV 89.8 87.6 87.6  MCH 29.4 29.3 29.0  MCHC 32.7 33.4 33.1  RDW 13.2 13.3 13.4  PLT 138* 135* 127*    Cardiac Enzymes Recent Labs  Lab 02/20/19 2156 02/21/19 0226 02/21/19 0913  TROPONINI 1.97* 6.79* 9.62*    Recent Labs  Lab 02/20/19 1724  TROPIPOC 0.01     BNPNo results for input(s): BNP, PROBNP in the last 168 hours.  DDimer No results for input(s): DDIMER in the last 168 hours.   Radiology    Ct Head Wo Contrast  Result Date: 02/20/2019 CLINICAL DATA:  85 year old with a near syncopal episode while watching television earlier this evening. EXAM: CT HEAD WITHOUT CONTRAST TECHNIQUE: Contiguous axial images were obtained from the base of the skull through the vertex without intravenous contrast. COMPARISON:  None. FINDINGS: Brain: Mild age related cortical atrophy. Asymmetry in the size of the LATERAL ventricles, RIGHT greater than LEFT, is felt to be developmental. No mass lesion. No midline shift. No acute hemorrhage or hematoma. No extra-axial fluid collections. No evidence of acute infarction. Vascular: Moderate BILATERAL carotid siphon and BILATERAL vertebral artery atherosclerosis. No hyperdense vessel. Skull: No skull fracture or other focal osseous abnormality involving the skull. Sinuses/Orbits: Visualized paranasal sinuses, bilateral mastoid air cells and bilateral middle ear cavities well-aerated. Frontal sinuses are  hypoplastic. Visualized orbits and globes normal in appearance. Other: Approximate 1.0 x 0.9 cm partially calcified meningioma arising from the floor of the RIGHT ANTERIOR cranial fossa. IMPRESSION: 1. No acute intracranial abnormality. 2. Mild age related cortical atrophy. 3. Incidental 1.0 cm partially calcified meningioma arising from the floor of the RIGHT ANTERIOR cranial fossa. Electronically Signed   By: Evangeline Dakin M.D.   On: 02/20/2019 19:29   Dg Chest Port 1 View  Result Date: 02/20/2019 CLINICAL DATA:  Chest pain EXAM: PORTABLE CHEST 1 VIEW COMPARISON:  11/09/2011 FINDINGS: Left subclavian AICD is stable. Normal heart size. Low lung volumes. Bibasilar atelectasis. No pneumothorax. No pleural effusion. IMPRESSION: Bibasilar atelectasis. Electronically Signed   By: Marybelle Killings M.D.   On: 02/20/2019 17:47   Vas US Carotid  Result Date: 02/21/2019 Carotid Arterial Duplex Study Indications:       Syncope and Dizziness. Risk Factors:      Hypertension, hyperlipidemia, coronary artery disease. Comparison Study:  No prior study on file for comparison Performing Technologist: Sharion Dove RVS  Examination Guidelines: A complete evaluation includes B-mode imaging, spectral Doppler, color Doppler, and power Doppler as needed of all accessible portions of each vessel. Bilateral testing is considered an integral part of a complete examination. Limited examinations for reoccurring indications may be performed as noted.  Right Carotid Findings: +----------+--------+--------+--------+--------+------------------+           PSV cm/sEDV cm/sStenosisDescribeComments           +----------+--------+--------+--------+--------+------------------+ CCA Prox  80      18                      intimal thickening +----------+--------+--------+--------+--------+------------------+ CCA Distal78      17                      intimal thickening  +----------+--------+--------+--------+--------+------------------+ ICA Prox  65      14                                         +----------+--------+--------+--------+--------+------------------+ ICA Distal51      15                                         +----------+--------+--------+--------+--------+------------------+ ECA       60      2                                          +----------+--------+--------+--------+--------+------------------+ +----------+--------+-------+--------+-------------------+  PSV cm/sEDV cmsDescribeArm Pressure (mmHG) +----------+--------+-------+--------+-------------------+ [id number]                                         +----------+--------+-------+--------+-------------------+ +---------+--------+--+--------+--+ VertebralPSV cm/s46EDV cm/s16 +---------+--------+--+--------+--+  Left Carotid Findings: +----------+--------+--------+--------+-----------+--------+           PSV cm/sEDV cm/sStenosisDescribe   Comments +----------+--------+--------+--------+-----------+--------+ CCA Prox  80      16                                  +----------+--------+--------+--------+-----------+--------+ CCA Distal88      20                                  +----------+--------+--------+--------+-----------+--------+ ICA Prox  46      9               homogeneous         +----------+--------+--------+--------+-----------+--------+ ICA Distal49      14                                  +----------+--------+--------+--------+-----------+--------+ ECA       58      5                                   +----------+--------+--------+--------+-----------+--------+ +----------+--------+--------+--------+-------------------+ SubclavianPSV cm/sEDV cm/sDescribeArm Pressure (mmHG) +----------+--------+--------+--------+-------------------+           55                                           +----------+--------+--------+--------+-------------------+ +---------+--------+--+--------+--+ VertebralPSV cm/s60EDV cm/s11 +---------+--------+--+--------+--+  Summary: Right Carotid: The extracranial vessels were near-normal with only minimal wall                thickening or plaque. Left Carotid: The extracranial vessels were near-normal with only minimal wall               thickening or plaque. Vertebrals:  Bilateral vertebral arteries demonstrate antegrade flow. Subclavians: Normal flow hemodynamics were seen in bilateral subclavian              arteries. *See table(s) above for measurements and observations.  Electronically signed by Monica Martinez MD on 02/21/2019 at 11:55:11 AM.    Final     Cardiac Studies   Echocardiogram 02/21/2019: IMPRESSIONS    1. Severe akinesis of the left ventricular, apical inferior wall.  2. The left ventricle has severely reduced systolic function, with an ejection fraction of 20-25%. Left ventricular diastolic Doppler parameters are consistent with impaired relaxation. Left ventricular diffuse hypokinesis.  3. Left atrial size was mildly dilated.  4. Mitral valve regurgitation is moderate by color flow Doppler.  5. Aortic valve regurgitation is mild by color flow Doppler.  6. The aortic root is normal in size and structure.  7. No intracardiac thrombi or masses were visualized.  Cardiac catheterization 02/22/2019: Conclusion     Patent LAD stent.  Mid LAD-2 lesion is 30% stenosed.  Dist LAD lesion is 50% stenosed.  Mid RCA lesion is  30% stenosed.  Dist RCA-1 lesion is 95% stenosed.  A drug-eluting stent was successfully placed using a STENT SYNERGY DES 3X20.  Post intervention, there is a 0% residual stenosis.  LV end diastolic pressure is normal.  There is no aortic valve stenosis.  Would not use right radial approach in teh future given subclavian tortuosity.   Continue aggressive secondary prevention.     Continue  angiomax until current bag runs out.   Coronary Findings   Diagnostic  Dominance: Right  Left Anterior Descending  The vessel exhibits minimal luminal irregularities.  Mid LAD-1 lesion 25% stenosed  Mid LAD-1 lesion is 25% stenosed. The lesion was previously treated.  Mid LAD-2 lesion 30% stenosed  Mid LAD-2 lesion is 30% stenosed.  Dist LAD lesion 50% stenosed  Dist LAD lesion is 50% stenosed.  Right Coronary Artery  Vessel is large.  Mid RCA lesion 30% stenosed  Mid RCA lesion is 30% stenosed. The lesion is calcified.  Dist RCA-1 lesion 95% stenosed  Dist RCA-1 lesion is 95% stenosed. Vessel is the culprit lesion. The lesion is irregular and ulcerative.  Dist RCA-2 lesion 10% stenosed  Dist RCA-2 lesion is 10% stenosed. The lesion was previously treated.  Intervention   Dist RCA-1 lesion  Stent  CATHETER LAUNCHER 6FR JR4 guide catheter was inserted. Lesion crossed with guidewire using a WIRE ASAHI PROWATER 180CM. Pre-stent angioplasty was performed using a BALLOON EMERGE MR 2.5X15. A drug-eluting stent was successfully placed using a STENT SYNERGY DES 3X20. Stent strut is well apposed. Stent overlaps previously placed stent. Post-stent angioplasty was performed using a BALLOON SAPPHIRE Webster 3.5X12.  Post-Intervention Lesion Assessment  The intervention was successful. Pre-interventional TIMI flow is 3. Post-intervention TIMI flow is 3. No complications occurred at this lesion.  There is a 0% residual stenosis post intervention.  Left Heart   Left Ventricle LV end diastolic pressure is normal.  Aortic Valve There is no aortic valve stenosis.  Coronary Diagrams   Diagnostic  Dominance: Right    Intervention      Patient Profile     85 y.o. male with known CAD and ischemic cardiomyopathy presenting with non-STEMI  Assessment & Plan    1.  Non-STEMI: Now status post PCI of critical stenosis in the RCA.  The patient's medical program is reviewed and includes dual  antiplatelet therapy with aspirin and ticagrelor, and ACE inhibitor, beta-blocker, and a statin drug.  2.  Chronic systolic heart failure secondary to severe underlying ischemic cardiomyopathy: Medical program reviewed as above.  No changes recommended at this time.  He appears well compensated.  Notable that LVEDP by catheterization is within normal limits.  Disposition: Anticipate discharge home tomorrow morning if agreeable by primary team.  The patient is feeling anxious today and I will give him another dose of Ativan.  For questions or updates, please contact [place] Please consult [email or web address] for contact info under        Signed, Sherren Mocha, MD  02/22/2019, 1:09 PM

## 2019-02-22 NOTE — Progress Notes (Signed)
PROGRESS NOTE    Adrian Pace  EHU:[id number] DOB: 1933/05/17 DOA: 02/20/2019 PCP: Kelton Pillar, MD   Brief Narrative:  Adrian Pace  is Adrian Pace 85 y.o. male,w CAD s/p cath 2003 w PCI LAD/ RCA , CHF (EF 25%), s/p ICD revision 2013, presents with c/o dizziness after supper, walked upstairs to bathroom continued to be dizzy with concomitant chest tightness, substernal without radiation.  Pt denies fever, chills, palp, sob, n/v, abd pain, diarrhea, brbpr, dysuria, lower ext edema.  Pt called EMS cause the dizziness (no vertigo) was not subsiding.  Pt states EMS gave him 4 baby aspirin but no nitro.  The dizziness and chest tightness resolved en route to ER. Note patient was bradycardic en route hr 48 ?  In ED,  T 97.5  P 75  Bp 129/63  Pox 93-98% on RA Wt 78kg  CT brain IMPRESSION: 1. No acute intracranial abnormality. 2. Mild age related cortical atrophy. 3. Incidental 1.0 cm partially calcified meningioma arising from the floor of the RIGHT ANTERIOR cranial fossa.  CXR IMPRESSION: Bibasilar atelectasis.  Wbc 5.9, Hgb 14.7, Plt 138 Na 140, K 4.6,  Bun 15, Creatinine 1.55 Glucose 120 Trop 0.01  EKG nsr at 70, LAD, nl int, t inversion in 1, avl, v5,6,  q in v1,2 and hint of st elevation in v1-3  No current chest pain at this time.  Pt seen by cardiology in ED, per ED, recommended heparin gtt Pt will be admitted for chest pain and dizziness  Assessment & Plan:   Principal Problem:   Chest pain Active Problems:   Essential hypertension   Anxiety   CAD (coronary artery disease)   Non-ST elevation (NSTEMI) myocardial infarction (HCC)   Dizziness   Thrombocytopenia (HCC)   Meningioma (HCC)   NSTEMI   Chest pain, hx of CAD s/p PCI Lad/ Rca , CHF (EF 25-30%) Appreciate cards assistance, s/p LHC Distal RCA-1 lesion 95% stenosed, DES placed with 0% residual stenosis (see report) Follow troponins (9.62 peak, further per cards) LDL 50, A1c 5.8 EKG appears c/w  priors Echo with EF 20-25% (see report) Heparin gtt Continue ASA, brilinta, statin, ACE, carvedilol (dose decreased in setting of bradycardia)  Bradycardia Decrease Carvedilol as above  Dizziness Seems resolved today Head CT without acute abnormality Will continue to monitor   ARF, mild Improved, baseline creatinine appears to be around 1.3  Anxiety   Insomnia Cont lorazepam 0.5mg  po qday prn Will add ambien qhs prn for sleep  Thrombocytopenia Mild, follow   DVT prophylaxis: heparin gtt Code Status: (full  Family Communication: none at bedside Disposition Plan: pending  Consultants:   cardiology  Procedures:  Echo IMPRESSIONS    1. Severe akinesis of the left ventricular, apical inferior wall.  2. The left ventricle has severely reduced systolic function, with an ejection fraction of 20-25%. Left ventricular diastolic Doppler parameters are consistent with impaired relaxation. Left ventricular diffuse hypokinesis.  3. Left atrial size was mildly dilated.  4. Mitral valve regurgitation is moderate by color flow Doppler.  5. Aortic valve regurgitation is mild by color flow Doppler.  6. The aortic root is normal in size and structure.  7. No intracardiac thrombi or masses were visualized.  LHC  Patent LAD stent.  Mid LAD-2 lesion is 30% stenosed.  Dist LAD lesion is 50% stenosed.  Mid RCA lesion is 30% stenosed.  Dist RCA-1 lesion is 95% stenosed.  [name] drug-eluting stent was successfully placed using [name] STENT SYNERGY DES 3X20.  Post  intervention, there is [name] 0% residual stenosis.  LV end diastolic pressure is normal.  There is no aortic valve stenosis.  Would not use right radial approach in teh future given subclavian tortuosity.   Continue aggressive secondary prevention.     Continue angiomax until current bag runs out.  Antimicrobials: Anti-infectives (From admission, onward)   None         Subjective: Doing well post cath No  complaints.  Objective: Vitals:   02/22/19 1432 02/22/19 1446 02/22/19 1517 02/22/19 1547  BP: 96/85 122/63 (!) 115/55 121/60  Pulse: 65 (!) 148 68 77  Resp:      Temp:      TempSrc:      SpO2: 96% 96% 97% 98%  Weight:      Height:        Intake/Output Summary (Last 24 hours) at 02/22/2019 2043 Last data filed at 02/22/2019 1500 Gross per 24 hour  Intake 1086.5 ml  Output 450 ml  Net 636.5 ml   Filed Weights   02/21/19 0434 02/21/19 1200 02/22/19 0454  Weight: 79.4 kg 79.2 kg 78.7 kg    Examination:  General: No acute distress. Lungs: unlabored Neurological: Alert and oriented 3. Moves all extremities 4 . Cranial nerves II through XII grossly intact. Skin: Warm and dry. No rashes or lesions. Extremities: No clubbing or cyanosis.  Psychiatric: Mood and affect are normal. Insight and judgment are appropriate.    Data Reviewed: I have personally reviewed following labs and imaging studies  CBC: Recent Labs  Lab 02/20/19 1721 02/21/19 0226 02/22/19 0447  WBC 5.9 8.0 7.1  HGB 14.7 14.6 15.5  HCT 44.9 43.7 46.8  MCV 89.8 87.6 87.6  PLT 138* 135* 256*   Basic Metabolic Panel: Recent Labs  Lab 02/20/19 1721 02/21/19 0226 02/22/19 0447  NA 140 141 139  K 4.6 4.2 4.2  CL 107 111 108  CO2 23 23 22   GLUCOSE 120* 113* 99  BUN 15 15 13   CREATININE 1.55* 1.34* 1.27*  CALCIUM 8.5* 8.7* 8.8*  MG  --   --  2.0   GFR: Estimated Creatinine Clearance: 41.9 mL/min ([name]) (by C-G formula based on SCr of 1.27 mg/dL (H)). Liver Function Tests: Recent Labs  Lab 02/21/19 0226 02/22/19 0447  AST 53* 44*  ALT 21 19  ALKPHOS 70 73  BILITOT 0.6 0.9  PROT 5.9* 6.2*  ALBUMIN 3.3* 3.4*   No results for input(s): LIPASE, AMYLASE in the last 168 hours. No results for input(s): AMMONIA in the last 168 hours. Coagulation Profile: No results for input(s): INR, PROTIME in the last 168 hours. Cardiac Enzymes: Recent Labs  Lab 02/20/19 2156 02/21/19 0226 02/21/19 0913   TROPONINI 1.97* 6.79* 9.62*   BNP (last 3 results) No results for input(s): PROBNP in the last 8760 hours. HbA1C: Recent Labs    02/21/19 0226  HGBA1C 5.8*   CBG: No results for input(s): GLUCAP in the last 168 hours. Lipid Profile: Recent Labs    02/21/19 0226  CHOL 95  HDL 28*  LDLCALC 50  TRIG 83  CHOLHDL 3.4   Thyroid Function Tests: Recent Labs    02/21/19 0226  TSH 0.534   Anemia Panel: No results for input(s): VITAMINB12, FOLATE, FERRITIN, TIBC, IRON, RETICCTPCT in the last 72 hours. Sepsis Labs: No results for input(s): PROCALCITON, LATICACIDVEN in the last 168 hours.  No results found for this or any previous visit (from the past 240 hour(s)).  Radiology Studies: Vas US Carotid  Result Date: 02/21/2019 Carotid Arterial Duplex Study Indications:       Syncope and Dizziness. Risk Factors:      Hypertension, hyperlipidemia, coronary artery disease. Comparison Study:  No prior study on file for comparison Performing Technologist: Sharion Dove RVS  Examination Guidelines: Jermari Tamargo complete evaluation includes B-mode imaging, spectral Doppler, color Doppler, and power Doppler as needed of all accessible portions of each vessel. Bilateral testing is considered an integral part of Charnita Trudel complete examination. Limited examinations for reoccurring indications may be performed as noted.  Right Carotid Findings: +----------+--------+--------+--------+--------+------------------+             PSV cm/s EDV cm/s Stenosis Describe Comments            +----------+--------+--------+--------+--------+------------------+  CCA Prox   80       18                         intimal thickening  +----------+--------+--------+--------+--------+------------------+  CCA Distal 78       17                         intimal thickening  +----------+--------+--------+--------+--------+------------------+  ICA Prox   65       14                                              +----------+--------+--------+--------+--------+------------------+  ICA Distal 51       15                                             +----------+--------+--------+--------+--------+------------------+  ECA        60       2                                              +----------+--------+--------+--------+--------+------------------+ +----------+--------+-------+--------+-------------------+             PSV cm/s EDV cms Describe Arm Pressure (mmHG)  +----------+--------+-------+--------+-------------------+  Subclavian 47                                             +----------+--------+-------+--------+-------------------+ +---------+--------+--+--------+--+  Vertebral PSV cm/s 46 EDV cm/s 16  +---------+--------+--+--------+--+  Left Carotid Findings: +----------+--------+--------+--------+-----------+--------+             PSV cm/s EDV cm/s Stenosis Describe    Comments  +----------+--------+--------+--------+-----------+--------+  CCA Prox   80       16                                      +----------+--------+--------+--------+-----------+--------+  CCA Distal 88       20                                      +----------+--------+--------+--------+-----------+--------+  ICA Prox   46       9                 homogeneous           +----------+--------+--------+--------+-----------+--------+  ICA Distal 49       14                                      +----------+--------+--------+--------+-----------+--------+  ECA        58       5                                       +----------+--------+--------+--------+-----------+--------+ +----------+--------+--------+--------+-------------------+  Subclavian PSV cm/s EDV cm/s Describe Arm Pressure (mmHG)  +----------+--------+--------+--------+-------------------+             55                                              +----------+--------+--------+--------+-------------------+ +---------+--------+--+--------+--+  Vertebral PSV cm/s 60 EDV cm/s 11   +---------+--------+--+--------+--+  Summary: Right Carotid: The extracranial vessels were near-normal with only minimal wall                thickening or plaque. Left Carotid: The extracranial vessels were near-normal with only minimal wall               thickening or plaque. Vertebrals:  Bilateral vertebral arteries demonstrate antegrade flow. Subclavians: Normal flow hemodynamics were seen in bilateral subclavian              arteries. *See table(s) above for measurements and observations.  Electronically signed by Monica Martinez MD on 02/21/2019 at 11:55:11 AM.    Final         Scheduled Meds:  [START ON 02/23/2019] aspirin  81 mg Oral Daily   benazepril  10 mg Oral Daily   carvedilol  12.5 mg Oral BID WC   simvastatin  40 mg Oral q1800   sodium chloride flush  3 mL Intravenous Q12H   sodium chloride flush  3 mL Intravenous Q12H   ticagrelor  90 mg Oral BID   Continuous Infusions:  sodium chloride     sodium chloride     sodium chloride       LOS: 1 day    Time spent: over 30 min    Fayrene Helper, MD [facility] Pager AMION  If 7PM-7AM, please contact night-coverage [email or web address] Password [facility] 02/22/2019, 8:43 PM

## 2019-02-22 NOTE — Progress Notes (Signed)
Site area:Right Femoral Artery   Site Prior to Removal: level 0  Pressure Applied For:  69mins  Minutes Beginning at: 1200  Manual:Yes by Sandy Salaam, RN  Patient Status During Pull: without complaints, A/O, resting comfortably  Post Pull Groin Site: level 0  Post Pull Instructions Given:  yes, verbalized and demonstrated understanding  Post Pull Pulses Present:  Yes   Dressing Applied:  Gauze, tegaderm  Bedrest started at: 1225

## 2019-02-23 ENCOUNTER — Encounter (HOSPITAL_COMMUNITY): Payer: Self-pay | Admitting: Interventional Cardiology

## 2019-02-23 LAB — MAGNESIUM: Magnesium: 1.9 mg/dL (ref 1.7–2.4)

## 2019-02-23 LAB — CBC
HCT: 41.9 % (ref 39.0–52.0)
Hemoglobin: 14.3 g/dL (ref 13.0–17.0)
MCH: 29.7 pg (ref 26.0–34.0)
MCHC: 34.1 g/dL (ref 30.0–36.0)
MCV: 87.1 fL (ref 80.0–100.0)
Platelets: 122 10*3/uL — ABNORMAL LOW (ref 150–400)
RBC: 4.81 MIL/uL (ref 4.22–5.81)
RDW: 13.3 % (ref 11.5–15.5)
WBC: 8.5 10*3/uL (ref 4.0–10.5)
nRBC: 0 % (ref 0.0–0.2)

## 2019-02-23 LAB — BASIC METABOLIC PANEL
Anion gap: 12 (ref 5–15)
BUN: 12 mg/dL (ref 8–23)
CO2: 21 mmol/L — ABNORMAL LOW (ref 22–32)
Calcium: 8.9 mg/dL (ref 8.9–10.3)
Chloride: 107 mmol/L (ref 98–111)
Creatinine, Ser: 1.33 mg/dL — ABNORMAL HIGH (ref 0.61–1.24)
GFR calc Af Amer: 56 mL/min — ABNORMAL LOW (ref >60)
GFR calc non Af Amer: 49 mL/min — ABNORMAL LOW (ref >60)
Glucose, Bld: 92 mg/dL (ref 70–99)
Potassium: 3.7 mmol/L (ref 3.5–5.1)
Sodium: 140 mmol/L (ref 135–145)

## 2019-02-23 MED ORDER — CARVEDILOL 12.5 MG PO TABS: 12.5000 mg | ORAL_TABLET | Freq: Two times a day (BID) | ORAL | 0 refills | Status: DC

## 2019-02-23 MED ORDER — ASPIRIN 81 MG PO CHEW: 81.0000 mg | CHEWABLE_TABLET | Freq: Every day | ORAL | 0 refills | Status: DC

## 2019-02-23 MED ORDER — TICAGRELOR 90 MG PO TABS: 90.0000 mg | ORAL_TABLET | Freq: Two times a day (BID) | ORAL | 0 refills | Status: DC

## 2019-02-23 NOTE — Care Management (Signed)
1046 02-23-19 Patient will transition home on Brilinta. 30 day free card provided to patient. Pharmacy will provide patient with co pay cost. No further needs from CM at this time. Bethena Roys, RN,BSN Case Manager [phone]

## 2019-02-23 NOTE — Discharge Summary (Signed)
Physician Discharge Summary  Adrian Pace KZS:[id number] DOB: August 03, 1933 DOA: 02/20/2019  PCP: Kelton Pillar, MD  Admit date: 02/20/2019 Discharge date: 02/23/2019  Time spent: 40 minutes  Recommendations for Outpatient Follow-up:  1. Follow up outpatient CBC/CMP 2. Follow up with cardiology outpatient   3. Follow up meningioma as an outpatient  Discharge Diagnoses:  Principal Problem:   Chest pain Active Problems:   Essential hypertension   Anxiety   CAD (coronary artery disease)   Non-ST elevation (NSTEMI) myocardial infarction ([facility])   Dizziness   Thrombocytopenia ([facility])   Meningioma ([facility])   Discharge Condition: stable  Diet recommendation: heart healthy  Filed Weights   02/21/19 1200 02/22/19 0454 02/23/19 0616  Weight: 79.2 kg 78.7 kg 78 kg    History of present illness:  WilliamColsonis a86 y.o.male,w CAD s/p cath 2003 w PCI LAD/ RCA , CHF (EF 25%), s/p ICD revision 2013, presents with c/o dizziness after supper, walked upstairs to bathroom continued to be dizzy with concomitant chest tightness, substernal without radiation. Pt denies fever, chills, palp, sob, n/v, abd pain, diarrhea, brbpr, dysuria, lower ext edema. Pt called EMS cause the dizziness (no vertigo) was not subsiding. Pt states EMS gave him 4 baby aspirin but no nitro. The dizziness and chest tightness resolved en route to ER. Note patient was bradycardic en route hr 48 ?  In ED,  T 97.5 P 75 Bp 129/63 Pox 93-98% on RA Wt 78kg  CT brain IMPRESSION: 1. No acute intracranial abnormality. 2. Mild age related cortical atrophy. 3. Incidental 1.0 cm partially calcified meningioma arising from the floor of the RIGHT ANTERIOR cranial fossa.  CXR IMPRESSION: Bibasilar atelectasis.  Wbc 5.9, Hgb 14.7, Plt 138 Na 140, K 4.6, Bun 15, Creatinine 1.55 Glucose 120 Trop 0.01  EKG nsr at 70, LAD, nl int, t inversion in 1, avl, v5,6, q in v1,2 and hint of st elevation in v1-3  No  current chest pain at this time.  Pt seen by cardiology in ED, per ED, recommended heparin gtt Pt will be admitted for chest pain and dizziness  Pt admitted with NSTEMI.  S/p cath with DES placed to distal RCA lesion.  D/c on 4/28 with aspirin/brilinta.  Hospital Course:  NSTEMI  Chest pain, hx of CAD s/p PCI Lad/ Rca , CHF (EF 25-30%) Appreciate cards assistance, s/p LHC Distal RCA-1 lesion 95% stenosed, DES placed with 0% residual stenosis (see report) Follow troponins (9.62 peak, further per cards) LDL 50, A1c 5.8 EKG appears c/w priors Echo with EF 20-25% (see report) Heparin gtt complete Continue ASA, brilinta, statin, ACE, carvedilol (dose decreased in setting of bradycardia)  Bradycardia Decrease Carvedilol as above  Dizziness Seems resolved today Head CT without acute abnormality Will continue to monitor  Likely due to nstemi  ARF, mild Improved, baseline creatinine appears to be around 1.3  Anxiety  Insomnia Cont lorazepam 0.5mg  po qday prn Will add ambien qhs prn for sleep  Thrombocytopenia Mild, follow   Procedures: Echo IMPRESSIONS   1. Severe akinesis of the left ventricular, apical inferior wall. 2. The left ventricle has severely reduced systolic function, with an ejection fraction of 20-25%. Left ventricular diastolic Doppler parameters are consistent with impaired relaxation. Left ventricular diffuse hypokinesis. 3. Left atrial size was mildly dilated. 4. Mitral valve regurgitation is moderate by color flow Doppler. 5. Aortic valve regurgitation is mild by color flow Doppler. 6. The aortic root is normal in size and structure. 7. No intracardiac thrombi or masses were visualized. **Note De-Identified vi Obfusction** LHC  Ptent LD stent.  Mid LD-2 lesion is 30% stenosed.  Dist LD lesion is 50% stenosed.  Mid RC lesion is 30% stenosed.  Dist RC-1 lesion is 95% stenosed.   drug-eluting stent ws successfully plced using  STENT SYNERGY DES 3X20.  Post  intervention, there is  0% residul stenosis.  LV end distolic pressure is norml.  There is no ortic vlve stenosis.  Would not use right rdil pproch in teh future given subclvin tortuosity.  Continue ggressive secondry prevention.    Continue ngiomx until current bg runs out.   Consulttions:  crdiology  Dischrge Exm: Vitls:   02/23/19 0558 02/23/19 0818  BP: (!) 131/58 (!) 145/82  Pulse: 74   Resp: 18   Temp: 98.4 F (36.9 C)   SpO2: 95%    Feels well [place] for home  Generl: No cute distress. Crdiovsculr: Hert sounds show  regulr rte, nd rhythm Lungs: Cler to usculttion bilterlly bdomen: Soft, nontender, nondistended Neurologicl: lert nd oriented 3. Moves ll extremities 4  Skin: Wrm nd dry. No rshes or lesions. Extremities: No clubbing or cynosis. No edem.    Dischrge Instructions   Dischrge Instructions    mb Referrl to Crdic Rehbilittion   Complete by:  s directed    Dignosis:   Coronry Stents NSTEMI     Cll MD for:  difficulty brething, hedche or visul disturbnces   Complete by:  s directed    Cll MD for:  extreme ftigue   Complete by:  s directed    Cll MD for:  hives   Complete by:  s directed    Cll MD for:  persistnt dizziness or light-hededness   Complete by:  s directed    Cll MD for:  persistnt nuse nd vomiting   Complete by:  s directed    Cll MD for:  redness, tenderness, or signs of infection (pin, swelling, redness, odor or green/yellow dischrge round incision site)   Complete by:  s directed    Cll MD for:  severe uncontrolled pin   Complete by:  s directed    Cll MD for:  temperture >100.4   Complete by:  s directed    Diet - low sodium hert helthy   Complete by:  s directed    Dischrge instructions   Complete by:  s directed    You were seen for  hert ttck.  You hd  crdic ctheteriztion with stent plcement.  You  should tke spirin nd brilint, these re extremely importnt to tke fter hving  stent plced.  Plese follow up with crdiology s n outptient.  Continue your coreg (we reduced the dose here) nd your lisinopril.  Return for new, recurrent, or worsening symptoms.  Plese sk your PCP to request records from this hospitliztion so they know wht ws done nd wht the next steps will be.   Increse ctivity slowly   Complete by:  s directed      llergies s of 02/23/2019      Rections   Codeine nxiety      Mediction List    STOP tking these medictions   spirin 325 MG tblet Replced by:  spirin 81 MG chewble tblet     TKE these medictions   spirin 81 MG chewble tblet Chew 1 tblet (81 mg totl) by mouth dily for 30 dys. Strt tking on:  pril 29, 2020 Replces:  spirin 325 MG tblet   benzepril 10 MG tblet **Note De-Identified vi Obfusction** Commonly known s:  LOTENSIN TKE ONE TBLET BY MOUTH DILY   crvedilol 12.5 MG tblet Commonly known s:  COREG Tke 1 tblet (12.5 mg totl) by mouth 2 (two) times dily with  mel for 30 dys. Wht chnged:    mediction strength  See the new instructions.   LORzepm 0.5 MG tblet Commonly known s:  TIVN Tke 0.5-1 mg by mouth See dmin instructions. Tke 1-2 tblets s needed for nxiety   simvsttin 40 MG tblet Commonly known s:  ZOCOR TKE ONE TBLET BY MOUTH DILY T 6PM Wht chnged:  See the new instructions.   ticgrelor 90 MG Tbs tblet Commonly known s:  BRILINT Tke 1 tblet (90 mg totl) by mouth 2 (two) times dily for 30 dys.      llergies  llergen Rections  . Codeine nxiety   Follow-up Informtion    Kelton Pillr, MD Follow up.   Specilty:  Fm[facility] Medicine Contct informtion: [address] [address] [phone]        Lelon Perl, MD .   Specilty:  Crdiology Contct informtion: [address] [place]  [id number] [phone]            The results of significnt dignostics from this hospitliztion (including imging, microbiology, ncillry nd lbortory) re listed below for reference.    Significnt Dignostic Studies: Ct Hed Wo Contrst  Result Dte: 02/20/2019 CLINICL DT:  81 yer old with  ner syncopl episode while wtching television erlier this evening. EXM: CT HED WITHOUT CONTRST TECHNIQUE: Contiguous xil imges were obtined from the bse of the skull through the vertex without intrvenous contrst. COMPRISON:  None. FINDINGS: Brin: Mild ge relted corticl trophy. symmetry in the size of the LTERL ventricles, RIGHT greter thn LEFT, is felt to be developmentl. No mss lesion. No midline shift. No cute hemorrhge or hemtom. No extr-xil fluid collections. No evidence of cute infrction. Vsculr: Moderte BILTERL crotid siphon nd BILTERL vertebrl rtery therosclerosis. No hyperdense vessel. Skull: No skull frcture or other focl osseous bnormlity involving the skull. Sinuses/Orbits: Visulized prnsl sinuses, bilterl mstoid ir cells nd bilterl middle er cvities well-erted. Frontl sinuses re hypoplstic. Visulized orbits nd globes norml in ppernce. Other: pproximte 1.0 x 0.9 cm prtilly clcified meningiom rising from the floor of the RIGHT NTERIOR crnil foss. IMPRESSION: 1. No cute intrcrnil bnormlity. 2. Mild ge relted corticl trophy. 3. Incidentl 1.0 cm prtilly clcified meningiom rising from the floor of the RIGHT NTERIOR crnil foss. Electroniclly Signed   By: Evngeline Dkin M.D.   On: 02/20/2019 19:29   Dg Chest Port 1 View  Result Dte: 02/20/2019 CLINICL DT:  Chest pin EXM: PORTBLE CHEST 1 VIEW COMPRISON:  11/09/2011 FINDINGS: Left subclvin ICD is stble. Norml hert size. Low lung volumes. Bibsilr telectsis. No pneumothorx. No pleurl effusion. IMPRESSION: Bibsilr  telectsis. Electroniclly Signed   By: Mrybelle Killings M.D.   On: 02/20/2019 17:47   Vs Kore Crotid  Result Dte: 02/21/2019 Crotid rteril Duplex Study Indictions:       Syncope nd Dizziness. Risk Fctors:      Hypertension, hyperlipidemi, coronry rtery disese. Comprison Study:  No prior study on file for comprison Performing Technologist: Shrion Dove RVS  Exmintion Guidelines:  complete evlution includes B-mode imging, spectrl Doppler, color Doppler, nd power Doppler s needed of ll ccessible portions of ech vessel. Bilterl testing is considered n integrl prt of  complete exmintion. Limited exmintions for reoccurring indictions my be performed s  noted.  Right Carotid Findings: +----------+--------+--------+--------+--------+------------------+           PSV cm/sEDV cm/sStenosisDescribeComments           +----------+--------+--------+--------+--------+------------------+ CCA Prox  80      18                      intimal thickening +----------+--------+--------+--------+--------+------------------+ CCA Distal78      17                      intimal thickening +----------+--------+--------+--------+--------+------------------+ ICA Prox  65      14                                         +----------+--------+--------+--------+--------+------------------+ ICA Distal51      15                                         +----------+--------+--------+--------+--------+------------------+ ECA       60      2                                          +----------+--------+--------+--------+--------+------------------+ +----------+--------+-------+--------+-------------------+           PSV cm/sEDV cmsDescribeArm Pressure (mmHG) +----------+--------+-------+--------+-------------------+ [id number]                                         +----------+--------+-------+--------+-------------------+ +---------+--------+--+--------+--+  VertebralPSV cm/s46EDV cm/s16 +---------+--------+--+--------+--+  Left Carotid Findings: +----------+--------+--------+--------+-----------+--------+           PSV cm/sEDV cm/sStenosisDescribe   Comments +----------+--------+--------+--------+-----------+--------+ CCA Prox  80      16                                  +----------+--------+--------+--------+-----------+--------+ CCA Distal88      20                                  +----------+--------+--------+--------+-----------+--------+ ICA Prox  46      9               homogeneous         +----------+--------+--------+--------+-----------+--------+ ICA Distal49      14                                  +----------+--------+--------+--------+-----------+--------+ ECA       58      5                                   +----------+--------+--------+--------+-----------+--------+ +----------+--------+--------+--------+-------------------+ SubclavianPSV cm/sEDV cm/sDescribeArm Pressure (mmHG) +----------+--------+--------+--------+-------------------+           55                                          +----------+--------+--------+--------+-------------------+ +---------+--------+--+--------+--+  VertebralPSV cm/s60EDV cm/s11 +---------+--------+--+--------+--+  Summary: Right Carotid: The extracranial vessels were near-normal with only minimal wall                thickening or plaque. Left Carotid: The extracranial vessels were near-normal with only minimal wall               thickening or plaque. Vertebrals:  Bilateral vertebral arteries demonstrate antegrade flow. Subclavians: Normal flow hemodynamics were seen in bilateral subclavian              arteries. *See table(s) above for measurements and observations.  Electronically signed by Monica Martinez MD on 02/21/2019 at 11:55:11 AM.    Final     Microbiology: No results found for this or any previous visit (from the past 240 hour(s)).    Labs: Basic Metabolic Panel: Recent Labs  Lab 02/20/19 1721 02/21/19 0226 02/22/19 0447 02/22/19 2119 02/23/19 0342  NA 140 141 139 140 140  K 4.6 4.2 4.2 4.3 3.7  CL 107 111 108 106 107  CO2 23 23 22 30  21*  GLUCOSE 120* 113* 99 106* 92  BUN 15 15 13 15 12   CREATININE 1.55* 1.34* 1.27* 1.36* 1.33*  CALCIUM 8.5* 8.7* 8.8* 9.2 8.9  MG  --   --  2.0  --  1.9   Liver Function Tests: Recent Labs  Lab 02/21/19 0226 02/22/19 0447 02/22/19 2119  AST 53* 44* 34  ALT 21 19 17   ALKPHOS 70 73 73  BILITOT 0.6 0.9 1.0  PROT 5.9* 6.2* 6.8  ALBUMIN 3.3* 3.4* 3.7   No results for input(s): LIPASE, AMYLASE in the last 168 hours. No results for input(s): AMMONIA in the last 168 hours. CBC: Recent Labs  Lab 02/20/19 1721 02/21/19 0226 02/22/19 0447 02/23/19 0342  WBC 5.9 8.0 7.1 8.5  HGB 14.7 14.6 15.5 14.3  HCT 44.9 43.7 46.8 41.9  MCV 89.8 87.6 87.6 87.1  PLT 138* 135* 127* 122*   Cardiac Enzymes: Recent Labs  Lab 02/20/19 2156 02/21/19 0226 02/21/19 0913  TROPONINI 1.97* 6.79* 9.62*   BNP: BNP (last 3 results) No results for input(s): BNP in the last 8760 hours.  ProBNP (last 3 results) No results for input(s): PROBNP in the last 8760 hours.  CBG: No results for input(s): GLUCAP in the last 168 hours.     Signed:  Fayrene Helper MD.  [facility] 02/23/2019, 10:39 AM

## 2019-02-23 NOTE — Progress Notes (Signed)
Cardiac Rehab Advisory Cardiac Rehab Phase I is not seeing pts face to face at this time due to Covid 19 restrictions. Ambulation is occurring through nursing, PT, and mobility teams. We will help facilitate that process as needed. We are calling pts in their rooms and discussing education. We will then deliver education materials to pts RN for delivery to pt.   [phone] Called pt and completed MI education and he voiced understanding. Stressed importance of brilinta with stent. Reviewed NTG use, MI restrictions, ex ed walking guidelines, weighing daily and signs of CHF with low EF. Encouraged low sodium and heart healthy food choices and diets given on both. Discussed CRP 2 and referred to [facility]. Pt attended program in late 1980's. Will also provide CHF booklet with low EF. Pt voiced understanding. Encouraged pt to walk with staff in hall prior to discharge to make sure not dizzy. Graylon Good RN BSN 02/23/2019 8:56 AM

## 2019-02-23 NOTE — Progress Notes (Signed)
Progress Note  Patient Name: Adrian Pace Date of Encounter: 02/23/2019  Primary Cardiologist: Kirk Ruths, MD   Subjective   Feeling well today.  No chest pain or shortness of breath.  Inpatient Medications    Scheduled Meds:  aspirin  81 mg Oral Daily   benazepril  10 mg Oral Daily   carvedilol  12.5 mg Oral BID WC   simvastatin  40 mg Oral q1800   sodium chloride flush  3 mL Intravenous Q12H   sodium chloride flush  3 mL Intravenous Q12H   ticagrelor  90 mg Oral BID   Continuous Infusions:  sodium chloride     sodium chloride     sodium chloride     PRN Meds: sodium chloride, sodium chloride, acetaminophen, LORazepam, ondansetron (ZOFRAN) IV, sodium chloride flush, sodium chloride flush, zolpidem   Vital Signs    Vitals:   02/22/19 2050 02/23/19 0558 02/23/19 0616 02/23/19 0818  BP: 121/76 (!) 131/58  (!) 145/82  Pulse: 79 74    Resp: 18 18    Temp: 97.6 F (36.4 C) 98.4 F (36.9 C)    TempSrc: Oral Oral    SpO2: 99% 95%    Weight:   78 kg   Height:        Intake/Output Summary (Last 24 hours) at 02/23/2019 0840 Last data filed at 02/23/2019 0600 Gross per 24 hour  Intake 517.5 ml  Output 900 ml  Net -382.5 ml   Last 3 Weights 02/23/2019 02/22/2019 02/21/2019  Weight (lbs) 171 lb 15.3 oz 173 lb 6.4 oz 174 lb 9.6 oz  Weight (kg) 78 kg 78.654 kg 79.198 kg      Telemetry    Normal sinus rhythm without significant arrhythmia- Personally Reviewed  ECG    SR with PVC - Personally Reviewed  Physical Exam  Alert, oriented male in no distress. GEN: No acute distress.   Neck: No JVD Cardiac: RRR, no murmurs, rubs, or gallops.  Respiratory: Clear to auscultation bilaterally. GI: Soft, nontender, non-distended  MS: No edema; No deformity.  Right radial and right femoral sites are clear with no hematoma or ecchymosis. Neuro:  Nonfocal  Psych: Normal affect   Labs    Chemistry Recent Labs  Lab 02/21/19 0226 02/22/19 0447  02/22/19 2119 02/23/19 0342  NA 141 139 140 140  K 4.2 4.2 4.3 3.7  CL 111 108 106 107  CO2 23 22 30  21*  GLUCOSE 113* 99 106* 92  BUN 15 13 15 12   CREATININE 1.34* 1.27* 1.36* 1.33*  CALCIUM 8.7* 8.8* 9.2 8.9  PROT 5.9* 6.2* 6.8  --   ALBUMIN 3.3* 3.4* 3.7  --   AST 53* 44* 34  --   ALT 21 19 17   --   ALKPHOS 70 73 73  --   BILITOT 0.6 0.9 1.0  --   GFRNONAA 48* 52* 47* 49*  GFRAA 56* 60* 55* 56*  ANIONGAP 7 9 4* 12     Hematology Recent Labs  Lab 02/21/19 0226 02/22/19 0447 02/23/19 0342  WBC 8.0 7.1 8.5  RBC 4.99 5.34 4.81  HGB 14.6 15.5 14.3  HCT 43.7 46.8 41.9  MCV 87.6 87.6 87.1  MCH 29.3 29.0 29.7  MCHC 33.4 33.1 34.1  RDW 13.3 13.4 13.3  PLT 135* 127* 122*    Cardiac Enzymes Recent Labs  Lab 02/20/19 2156 02/21/19 0226 02/21/19 0913  TROPONINI 1.97* 6.79* 9.62*    Recent Labs  Lab 02/20/19 1724  TROPIPOC  0.01     BNPNo results for input(s): BNP, PROBNP in the last 168 hours.   DDimer No results for input(s): DDIMER in the last 168 hours.   Radiology    Vas US Carotid  Result Date: 02/21/2019 Carotid Arterial Duplex Study Indications:       Syncope and Dizziness. Risk Factors:      Hypertension, hyperlipidemia, coronary artery disease. Comparison Study:  No prior study on file for comparison Performing Technologist: Sharion Dove RVS  Examination Guidelines: A complete evaluation includes B-mode imaging, spectral Doppler, color Doppler, and power Doppler as needed of all accessible portions of each vessel. Bilateral testing is considered an integral part of a complete examination. Limited examinations for reoccurring indications may be performed as noted.  Right Carotid Findings: +----------+--------+--------+--------+--------+------------------+             PSV cm/s EDV cm/s Stenosis Describe Comments            +----------+--------+--------+--------+--------+------------------+  CCA Prox   80       18                         intimal thickening   +----------+--------+--------+--------+--------+------------------+  CCA Distal 78       17                         intimal thickening  +----------+--------+--------+--------+--------+------------------+  ICA Prox   65       14                                             +----------+--------+--------+--------+--------+------------------+  ICA Distal 51       15                                             +----------+--------+--------+--------+--------+------------------+  ECA        60       2                                              +----------+--------+--------+--------+--------+------------------+ +----------+--------+-------+--------+-------------------+             PSV cm/s EDV cms Describe Arm Pressure (mmHG)  +----------+--------+-------+--------+-------------------+  Subclavian 47                                             +----------+--------+-------+--------+-------------------+ +---------+--------+--+--------+--+  Vertebral PSV cm/s 46 EDV cm/s 16  +---------+--------+--+--------+--+  Left Carotid Findings: +----------+--------+--------+--------+-----------+--------+             PSV cm/s EDV cm/s Stenosis Describe    Comments  +----------+--------+--------+--------+-----------+--------+  CCA Prox   80       16                                      +----------+--------+--------+--------+-----------+--------+  CCA Distal 88       20                                      +----------+--------+--------+--------+-----------+--------+  ICA Prox   46       9                 homogeneous           +----------+--------+--------+--------+-----------+--------+  ICA Distal 49       14                                      +----------+--------+--------+--------+-----------+--------+  ECA        58       5                                       +----------+--------+--------+--------+-----------+--------+ +----------+--------+--------+--------+-------------------+  Subclavian PSV cm/s EDV cm/s Describe Arm Pressure  (mmHG)  +----------+--------+--------+--------+-------------------+             55                                              +----------+--------+--------+--------+-------------------+ +---------+--------+--+--------+--+  Vertebral PSV cm/s 60 EDV cm/s 11  +---------+--------+--+--------+--+  Summary: Right Carotid: The extracranial vessels were near-normal with only minimal wall                thickening or plaque. Left Carotid: The extracranial vessels were near-normal with only minimal wall               thickening or plaque. Vertebrals:  Bilateral vertebral arteries demonstrate antegrade flow. Subclavians: Normal flow hemodynamics were seen in bilateral subclavian              arteries. *See table(s) above for measurements and observations.  Electronically signed by Monica Martinez MD on 02/21/2019 at 11:55:11 AM.    Final     Cardiac Studies   TTE: 02/21/19  IMPRESSIONS   1. Severe akinesis of the left ventricular, apical inferior wall.  2. The left ventricle has severely reduced systolic function, with an ejection fraction of 20-25%. Left ventricular diastolic Doppler parameters are consistent with impaired relaxation. Left ventricular diffuse hypokinesis.  3. Left atrial size was mildly dilated.  4. Mitral valve regurgitation is moderate by color flow Doppler.  5. Aortic valve regurgitation is mild by color flow Doppler.  6. The aortic root is normal in size and structure.  7. No intracardiac thrombi or masses were visualized.  Cath: 02/22/19   Patent LAD stent.  Mid LAD-2 lesion is 30% stenosed.  Dist LAD lesion is 50% stenosed.  Mid RCA lesion is 30% stenosed.  Dist RCA-1 lesion is 95% stenosed.  A drug-eluting stent was successfully placed using a STENT SYNERGY DES 3X20.  Post intervention, there is a 0% residual stenosis.  LV end diastolic pressure is normal.  There is no aortic valve stenosis.  Would not use right radial approach in teh future given subclavian  tortuosity.   Continue aggressive secondary prevention.    Patient Profile     85 y.o. male with known CAD, ICM s/p ICD, HTN, HL, VT, GERD who presented with NSTEMI.   Assessment & Plan    1.  Non-STEMI: Now status post PCI of critical stenosis in the RCA.  Plan for DAPT with ASA/Brilinta. Cardiac rehab phase I to be completed via phone  2/2 to COVID precautions.  -- continue on ACE inhibitor, beta-blocker, and a statin drug.  2. Chronic systolic heart failure secondary to severe underlying ischemic cardiomyopathy: Notable that LVEDP by catheterization is within normal limits. Tolerating BB and ACEi therapy.   3. HL: on statin, LDL 50.  4. ICD: followed by Dr. Lovena Le  5. NSVT: on BB. K+ and Mg+ stable.  [facility] will sign off.   Medication Recommendations:  Noted above Other recommendations (labs, testing, etc):  none Follow up as an outpatient:  Will arrange for TOC evisit.  For questions or updates, please contact [place] Please consult [email or web address] for contact info under       Signed, Reino Bellis, NP  02/23/2019, 8:40 AM    Patient seen, examined. Available data reviewed. Agree with findings, assessment, and plan as outlined by Reino Bellis, NP.  The findings above reflect my personal physical exam findings of this patient today.  The patient has done well following PCI of critical stenosis in the right coronary artery.  His access sites are clear.  We reviewed medical therapy.  He understands to reduce his aspirin dose to 80 mg daily in the context of ticagrelor 90 mg twice daily.  Post hospital follow-up will be arranged.  The patient is stable for discharge from a cardiac perspective.  Sherren Mocha, M.D. 02/23/2019 9:44 AM

## 2019-02-26 ENCOUNTER — Telehealth: Payer: Self-pay | Admitting: Cardiology

## 2019-02-26 NOTE — Telephone Encounter (Signed)
New message    Pt c/o Shortness Of Breath: STAT if SOB developed within the last 24 hours or pt is noticeably SOB on the phone  1. Are you currently SOB (can you hear that pt is SOB on the phone)?no, patient feels like he needs to take a deeper breath at time  2. How long have you been experiencing SOB? Since 02/23/2019  3. Are you SOB when sitting or when up moving around?both   4. Are you currently experiencing any other symptoms? tired

## 2019-02-26 NOTE — Telephone Encounter (Addendum)
Spoke to pt who states recently he was admitted and  had a cath preformed on 4/27. He report since discharged, he has had episodes where he feel like he need to take a deeper breath. He denies any pain, swelling, and report weight is actually down a lb. He state he doesn't know if symptoms are related to his heart or being anxious. Will route to triage PA for recommendations.

## 2019-02-26 NOTE — Telephone Encounter (Signed)
Message received from Triage. I returned patient call.  Patient states he sometimes feels he needs to take a deep breath once per hour. He denies chest pain and can walk up and down stairs in his house without pain or SOB. No orthopnea, no lower extremity swelling.  Sounds like a side effect of Brilinta. I reviewed trying caffeine with his brilinta. We also reviewed his benzodiazepine regimen. He will not combine ambien with ativan at night.    Tami Lin Duke, PA-C 02/26/2019, 5:11 PM

## 2019-03-01 ENCOUNTER — Telehealth: Payer: Self-pay

## 2019-03-01 NOTE — Telephone Encounter (Signed)
Virtual Visit Pre-Appointment Phone Call  "Mr Economou, I am calling you today to discuss your upcoming appointment. We are currently trying to limit exposure to the virus that causes COVID-19 by seeing patients at home rather than in the office."  1. "What is the BEST phone number to call the day of the visit?" - include this in appointment notes  2. "Do you have or have access to (through a family member/friend) a smartphone with video capability that we can use for your visit?" a. If yes - list this number in appt notes as "cell" (if different from BEST phone #) and list the appointment type as a VIDEO visit in appointment notes b. If no - list the appointment type as a PHONE visit in appointment notes  3. Confirm consent - "In the setting of the current Covid19 crisis, you are scheduled for a PHONE visit with your provider on 03/03/2019 at [facility].  Just as we do with many in-office visits, in order for you to participate in this visit, we must obtain consent.  If you'd like, I can send this to your mychart (if signed up) or email for you to review.  Otherwise, I can obtain your verbal consent now.  All virtual visits are billed to your insurance company just like a normal visit would be.  By agreeing to a virtual visit, we'd like you to understand that the technology does not allow for your provider to perform an examination, and thus may limit your provider's ability to fully assess your condition. If your provider identifies any concerns that need to be evaluated in person, we will make arrangements to do so.  Finally, though the technology is pretty good, we cannot assure that it will always work on either your or our end, and in the setting of a video visit, we may have to convert it to a phone-only visit.  In either situation, we cannot ensure that we have a secure connection.  Are you willing to proceed?" STAFF: Did the patient verbally acknowledge consent to telehealth visit? Document YES/NO  here: YES  4. Advise patient to be prepared - "Two hours prior to your appointment, go ahead and check your blood pressure, pulse, oxygen saturation, and your weight (if you have the equipment to check those) and write them all down. When your visit starts, your provider will ask you for this information. If you have an Apple Watch or Kardia device, please plan to have heart rate information ready on the day of your appointment. Please have a pen and paper handy nearby the day of the visit as well."  5. Give patient instructions for MyChart download to smartphone OR [email or web address] as below if video visit (depending on what platform provider is using)  6. Inform patient they will receive a phone call 15 minutes prior to their appointment time (may be from unknown caller ID) so they should be prepared to answer    TELEPHONE CALL NOTE  Adrian Pace has been deemed a candidate for a follow-up tele-health visit to limit community exposure during the Covid-19 pandemic. I spoke with the patient via phone to ensure availability of phone/video source, confirm preferred email & phone number, and discuss instructions and expectations.  I reminded Adrian Pace to be prepared with any vital sign and/or heart rhythm information that could potentially be obtained via home monitoring, at the time of his visit. I reminded Adrian Pace to expect a phone call prior to his  visit.  Harold Hedge, CMA 03/01/2019 4:09 PM   INSTRUCTIONS FOR DOWNLOADING THE MYCHART APP TO SMARTPHONE  - The patient must first make sure to have activated MyChart and know their login information - If Apple, go to [employer] and type in MyChart in the search bar and download the app. If Android, ask patient to go to [employer] and type in [place] in the search bar and download the app. The app is free but as with any other app downloads, their phone may require them to verify saved payment information or Apple/Android  password.  - The patient will need to then log into the app with their MyChart username and password, and select [facility] as their healthcare provider to link the account. When it is time for your visit, go to the MyChart app, find appointments, and click Begin Video Visit. Be sure to Select Allow for your device to access the Microphone and Camera for your visit. You will then be connected, and your provider will be with you shortly.  **If they have any issues connecting, or need assistance please contact MyChart service desk [phone])**  **If using a computer, in order to ensure the best quality for their visit they will need to use either of the following Internet Browsers: [employer], or Google Chrome**  IF USING DOXIMITY or DOXY.ME - The patient will receive a link just prior to their visit by text.     FULL LENGTH CONSENT FOR TELE-HEALTH VISIT   I hereby voluntarily request, consent and authorize [facility] and its employed or contracted physicians, physician assistants, nurse practitioners or other licensed health care professionals (the Practitioner), to provide me with telemedicine health care services (the "Services") as deemed necessary by the treating Practitioner. I acknowledge and consent to receive the Services by the Practitioner via telemedicine. I understand that the telemedicine visit will involve communicating with the Practitioner through live audiovisual communication technology and the disclosure of certain medical information by electronic transmission. I acknowledge that I have been given the opportunity to request an in-person assessment or other available alternative prior to the telemedicine visit and am voluntarily participating in the telemedicine visit.  I understand that I have the right to withhold or withdraw my consent to the use of telemedicine in the course of my care at any time, without affecting my right to future care or treatment,  and that the Practitioner or I may terminate the telemedicine visit at any time. I understand that I have the right to inspect all information obtained and/or recorded in the course of the telemedicine visit and may receive copies of available information for a reasonable fee.  I understand that some of the potential risks of receiving the Services via telemedicine include:  Marland Kitchen Delay or interruption in medical evaluation due to technological equipment failure or disruption; . Information transmitted may not be sufficient (e.g. poor resolution of images) to allow for appropriate medical decision making by the Practitioner; and/or  . In rare instances, security protocols could fail, causing a breach of personal health information.  Furthermore, I acknowledge that it is my responsibility to provide information about my medical history, conditions and care that is complete and accurate to the best of my ability. I acknowledge that Practitioner's advice, recommendations, and/or decision may be based on factors not within their control, such as incomplete or inaccurate data provided by me or distortions of diagnostic images or specimens that may result from electronic transmissions. I  understand that the practice of medicine is not an exact science and that Practitioner makes no warranties or guarantees regarding treatment outcomes. I acknowledge that I will receive a copy of this consent concurrently upon execution via email to the email address I last provided but may also request a printed copy by calling the office of [facility].    I understand that my insurance will be billed for this visit.   I have read or had this consent read to me. . I understand the contents of this consent, which adequately explains the benefits and risks of the Services being provided via telemedicine.  . I have been provided ample opportunity to ask questions regarding this consent and the Services and have had my questions  answered to my satisfaction. . I give my informed consent for the services to be provided through the use of telemedicine in my medical care  By participating in this telemedicine visit I agree to the above.

## 2019-03-02 ENCOUNTER — Telehealth: Payer: Self-pay | Admitting: Cardiology

## 2019-03-02 NOTE — Telephone Encounter (Signed)
Smartphone/ my chart/ consent/ pre reg completed °

## 2019-03-03 ENCOUNTER — Telehealth (HOSPITAL_COMMUNITY): Payer: Self-pay | Admitting: *Deleted

## 2019-03-03 ENCOUNTER — Telehealth: Payer: Self-pay | Admitting: Pharmacist

## 2019-03-03 ENCOUNTER — Telehealth: Payer: Self-pay

## 2019-03-03 ENCOUNTER — Telehealth (INDEPENDENT_AMBULATORY_CARE_PROVIDER_SITE_OTHER): Payer: Medicare Other | Admitting: Cardiology

## 2019-03-03 ENCOUNTER — Encounter: Payer: Self-pay | Admitting: Cardiology

## 2019-03-03 VITALS — BP 136/78 | HR 75 | Ht 67.0 in | Wt 170.0 lb

## 2019-03-03 DIAGNOSIS — N183 Chronic kidney disease, stage 3 unspecified: Secondary | ICD-10-CM

## 2019-03-03 DIAGNOSIS — I2510 Atherosclerotic heart disease of native coronary artery without angina pectoris: Secondary | ICD-10-CM

## 2019-03-03 DIAGNOSIS — I214 Non-ST elevation (NSTEMI) myocardial infarction: Secondary | ICD-10-CM | POA: Diagnosis not present

## 2019-03-03 DIAGNOSIS — I255 Ischemic cardiomyopathy: Secondary | ICD-10-CM

## 2019-03-03 DIAGNOSIS — Z9861 Coronary angioplasty status: Secondary | ICD-10-CM

## 2019-03-03 DIAGNOSIS — I10 Essential (primary) hypertension: Secondary | ICD-10-CM

## 2019-03-03 DIAGNOSIS — E785 Hyperlipidemia, unspecified: Secondary | ICD-10-CM

## 2019-03-03 DIAGNOSIS — Z95810 Presence of automatic (implantable) cardiac defibrillator: Secondary | ICD-10-CM

## 2019-03-03 MED ORDER — SACUBITRIL-VALSARTAN 24-26 MG PO TABS: 1.0000 | ORAL_TABLET | Freq: Two times a day (BID) | ORAL | 0 refills | Status: DC

## 2019-03-03 NOTE — Progress Notes (Signed)
Okay to change lisinopril to [facility] I think was the question.  Hold lisinopril for 36 hours and begin Entresto 24/26 twice daily.  Bmet 1 week. Adrian Pace

## 2019-03-03 NOTE — Progress Notes (Signed)
Sure.  Hold lisinopril for 36 hours and then begin Entresto 24/26 twice daily.  Will check potassium and renal function 1 week later. Kirk Ruths

## 2019-03-03 NOTE — Progress Notes (Signed)
Virtual Visit via Video Note   This visit type was conducted due to national recommendations for restrictions regarding the COVID-19 Pandemic (e.g. social distancing) in an effort to limit this patient's exposure and mitigate transmission in our community.  Due to his co-morbid illnesses, this patient is at least at moderate risk for complications without adequate follow up.  This format is felt to be most appropriate for this patient at this time.  All issues noted in this document were discussed and addressed.  A limited physical exam was performed with this format.  Please refer to the patient's chart for his consent to telehealth for [facility].   Date:  03/03/2019   ID:  Adrian Pace, DOB 08-Jan-1933, MRN [id number]  Patient Location: Home Provider Location: Home  PCP:  Kelton Pillar, MD  Cardiologist:  Kirk Ruths, MD  Electrophysiologist:  Dr Lovena Le  Evaluation Performed:  Follow-Up Visit  Chief Complaint:  Post hospital   History of Present Illness:    Adrian Pace is a 85 y.o. male with a history of CAD.  He had a remote RCA and LAD PCI in 2003.  He has an ischemic cardiomyopathy.  He had a Med[employer]onic ICD placed in 2005, he had a lead revision in 2013.  His ejection fraction has been 20 to 30%.  Despite this he has not had trouble with congestive heart failure.  Other medical issues include treated hypertension, dyslipidemia, and chronic renal insufficiency stage III with a GFR of 49.  The patient presented to the hospital 02/20/2019 with a NSTEMI.  He developed midsternal chest pain associated with nausea and diaphoresis at home and his wife called EMS.  His initial troponin was negative but subsequent troponins turn positive.  His troponin peaked at 9.6.  At catheterization his previously placed LAD and RCA stents were patent.  He did have a new 90% RCA stenosis just proximal to the previously placed stent.  This was treated with PCI and DES.  He tolerated this well.   Echocardiogram prior to discharge showed an ejection fraction of 20 to 25%.  Patient was contacted today for a post hospital visit.  He tells me he has been doing well since discharge, he is not had chest pain.  He has experienced some shortness of breath sensation, he did call the office and was advised to try taking his Brilinta with some caffeine and this has helped.  He is not having any problems with his medications.  He asked if it was okay to drive, he goes out and gets takeout food.  The patient does not have symptoms concerning for COVID-19 infection (fever, chills, cough, or new shortness of breath).    Past Medical History:  Diagnosis Date  . Anxiety    Takes Ativan as needed  . Cardiac dysrhythmia, unspecified   . CHF (congestive heart failure) ([place])   . Coronary artery disease    Hx of cardiac caths   . Enlarged prostate   . GERD (gastroesophageal reflux disease)    Takes prilosec  . Gout   . Heart attack ([place])   . HTN (hypertension)   . Hyperkalemia   . Hyperlipemia   . Ischemic cardiomyopathy   . LV dysfunction   . Myocardial infarct ([place]) 1988  . Panic attacks   . Personal history of peptic ulcer disease   . Renal insufficiency   . Syncope and collapse   . Ventricular tachycardia [facility])    Past Surgical History:  Procedure Laterality  Date  . CARDIAC CATHETERIZATION  2003  . CARDIAC DEFIBRILLATOR PLACEMENT    . COLONOSCOPY    . CORONARY ANGIOPLASTY    . CORONARY STENT INTERVENTION N/A 02/22/2019   Procedure: CORONARY STENT INTERVENTION;  Surgeon: Jettie Booze, MD;  Location: [place] CV LAB;  Service: Cardiovascular;  Laterality: N/A;  . ESOPHAGOGASTRODUODENOSCOPY     with biopsy  . HERNIA REPAIR  01/01/2012  . INSERT / REPLACE / REMOVE PACEMAKER  2005   ICD; replaced in Jan 2013  . LEAD REVISION N/A 11/08/2011   Procedure: LEAD REVISION;  Surgeon: Thompson Grayer, MD;  Location: [facility] CATH LAB;  Service: Cardiovascular;  Laterality: N/A;  . LEFT HEART  CATH AND CORONARY ANGIOGRAPHY N/A 02/22/2019   Procedure: LEFT HEART CATH AND CORONARY ANGIOGRAPHY;  Surgeon: Jettie Booze, MD;  Location: [place] CV LAB;  Service: Cardiovascular;  Laterality: N/A;  . UMBILICAL HERNIA REPAIR    . UMBILICAL HERNIA REPAIR  01/01/2012   Procedure: HERNIA REPAIR UMBILICAL ADULT;  Surgeon: Luella Cook III, MD;  Location: [place];  Service: General;  Laterality: N/A;  umbilical hernia repair with mesh     Current Meds  Medication Sig  . aspirin 81 MG chewable tablet Chew 1 tablet (81 mg total) by mouth daily for 30 days.  . benazepril (LOTENSIN) 10 MG tablet TAKE ONE TABLET BY MOUTH DAILY (Patient taking differently: TAKE ONE TABLET BY MOUTH DAILY)  . carvedilol (COREG) 12.5 MG tablet Take 1 tablet (12.5 mg total) by mouth 2 (two) times daily with a meal for 30 days.  Marland Kitchen LORazepam (ATIVAN) 0.5 MG tablet Take 0.5-1 mg by mouth See admin instructions. Take 1-2 tablets as needed for Anxiety  . simvastatin (ZOCOR) 40 MG tablet TAKE ONE TABLET BY MOUTH DAILY AT 6PM (Patient taking differently: Take 40 mg by mouth daily at 6 PM. )  . ticagrelor (BRILINTA) 90 MG TABS tablet Take 1 tablet (90 mg total) by mouth 2 (two) times daily for 30 days.  Marland Kitchen zolpidem (AMBIEN) 10 MG tablet Take 10 mg by mouth at bedtime.     Allergies:   Codeine   Social History   Tobacco Use  . Smoking status: Never Smoker  . Smokeless tobacco: Never Used  Substance Use Topics  . Alcohol use: Yes    Comment: occassionally  . Drug use: No     Family Hx: The patient's family history includes Cancer in his father; Heart attack in his father; Heart disease in his father. There is no history of Anesthesia problems, Hypotension, Malignant hyperthermia, or Pseudochol deficiency.  ROS:   Please see the history of present illness.    All other systems reviewed and are negative.   Prior CV studies:   The following studies were reviewed today:  Cath/ PCI-02/22/2019 Echo- 02/21/2019   Labs/Other Tests and Data Reviewed:    EKG:  No ECG reviewed.  Recent Labs: 02/21/2019: TSH 0.534 02/22/2019: ALT 17 02/23/2019: BUN 12; Creatinine, Ser 1.33; Hemoglobin 14.3; Magnesium 1.9; Platelets 122; Potassium 3.7; Sodium 140   Recent Lipid Panel Lab Results  Component Value Date/Time   CHOL 95 02/21/2019 02:26 AM   TRIG 83 02/21/2019 02:26 AM   HDL 28 (L) 02/21/2019 02:26 AM   CHOLHDL 3.4 02/21/2019 02:26 AM   LDLCALC 50 02/21/2019 02:26 AM    Wt Readings from Last 3 Encounters:  03/03/19 170 lb (77.1 kg)  02/23/19 171 lb 15.3 oz (78 kg)  06/01/18 181 lb (82.1 kg)  Objective:    Vital Signs:  BP 136/78   Pulse 75   Ht 5\' 7"  (1.702 m)   Wt 170 lb (77.1 kg)   BMI 26.63 kg/m   Well developed male- NAD, no SOB with conversation VITAL SIGNS:  reviewed  ASSESSMENT & PLAN:    NSTEMI- Seen today post NSTEMI- doing well  CAD- RCA and LAD PCI 2003 NSTEMI April 2020- new stenosis proximal to previously placed RCA stent- PCI/DES. RCA and LAD stents from 2003 patent.  ICM- EF April 2020- 20-25% by echo No history of CHF  ICD- MDT ICD placed '05.  Lead revision 2013.  He is due for ICD check 03/29/2019 with Dr Lovena Le  HTN- Controlled  CRI-3 GFR 49  HLD-  LDL 50 02/21/2019   COVID-19 Education: The signs and symptoms of COVID-19 were discussed with the patient and how to seek care for testing (follow up with PCP or arrange E-visit).  The importance of social distancing was discussed today.  Time:   Today, I have spent 25 minutes with the patient with telehealth technology discussing the above problems.     Medication Adjustments/Labs and Tests Ordered: Current medicines are reviewed at length with the patient today.  Concerns regarding medicines are outlined above.   Tests Ordered: No orders of the defined types were placed in this encounter.   Medication Changes: No orders of the defined types were placed in this encounter.   Disposition:  Follow  up Dr Stanford Breed in Aug- I will ask Dr Stanford Breed about Delene Loll.  I suggested he wean off caffiene   Signed, Kerin Ransom, [place]  03/03/2019 9:13 AM    [place]

## 2019-03-03 NOTE — Patient Instructions (Addendum)
Medication Instructions:  Your physician recommends that you continue on your current medications as directed. Please refer to the Current Medication list given to you today. If you need a refill on your cardiac medications before your next appointment, please call your pharmacy.   Lab work: None  If you have labs (blood work) drawn today and your tests are completely normal, you will receive your results only by: Marland Kitchen MyChart Message (if you have MyChart) OR . A paper copy in the mail If you have any lab test that is abnormal or we need to change your treatment, we will call you to review the results.  Testing/Procedures: none  Follow-Up: At [facility], you and your health needs are our priority.  As part of our continuing mission to provide you with exceptional heart care, we have created designated Provider Care Teams.  These Care Teams include your primary Cardiologist (physician) and Advanced Practice Providers (APPs -  Physician Assistants and Nurse Practitioners) who all work together to provide you with the care you need, when you need it. You will need a follow up appointment in 3 months. You may see Kirk Ruths, MD or one of the following Advanced Practice Providers on your designated Care Team: [place], [place] . Fabian Sharp, PA-C  Any Other Special Instructions Will Be Listed Below (If Applicable). 1. TRY TO WEAN YOURSELF OFF OF CAFFEINE  2. OK FOR YOU TO DRIVE

## 2019-03-03 NOTE — Telephone Encounter (Signed)
Per Lurena Joiner Dr Stanford Breed wants pt to start [facility] 24/26. Sent secure chat to pharmacy and Raquel(pharmD) contacted patient and gave titration instructions. And will need a bmet in 1-2 weeks.  Rx sent to pharmracy and Kristin(pharmD) will mail 30 day savings card.

## 2019-03-03 NOTE — Telephone Encounter (Signed)
Contacted patient to discuss AVS instructions. Patient agreeable with Luke recommendations and voiced understanding. 

## 2019-03-03 NOTE — Telephone Encounter (Signed)
Called and spoke to pt regarding referral and Cardiac Rehab continued closure due to adherence of national recommendation for Covid -19  in group setting.  Pt verbalized understanding and is interested in participating when we are permitted.  Pt is doing well with exercise and is making some changes with his heart healthy diet but admits this has been difficult for him.  Will send pt education material on exercise and heart healthy nutrition. Cherre Huger, BSN Cardiac and Training and development officer

## 2019-03-03 NOTE — Progress Notes (Signed)
?   No message here  Adrian Pace

## 2019-03-03 NOTE — Telephone Encounter (Signed)
Talked to Mr Herter this afternoon.  Transitin from benazepril to Entresto 24/26mg  twice daily discussed.  30 day free card was mailed to the patient and he understands need for blood work 1-2 weeks after initiating Entresto therapy.  All questions answered. Pharmacist will follow up by phone in 3-4 weeks.

## 2019-03-04 ENCOUNTER — Other Ambulatory Visit: Payer: Self-pay | Admitting: Cardiology

## 2019-03-04 ENCOUNTER — Telehealth: Payer: Self-pay | Admitting: Cardiology

## 2019-03-04 NOTE — Telephone Encounter (Signed)
Contacted Holly at [employer] provided for [facility]. She states that it appears benazepril, which hasn't been ordered in about a year, showed up on list of prescriptions to fill along with new Entresto order and concern is that pt cannot take the two together. Informed her that per notes pt will not be taking benazepril but will be taking Entresto. She verbalized understanding and states she will discuss with pt when he presents to pick up med.

## 2019-03-04 NOTE — Telephone Encounter (Deleted)
Lotensin 10 mg refilled.

## 2019-03-04 NOTE — Addendum Note (Signed)
Addended by: Ulice Brilliant T on: 03/04/2019 02:30 PM   Modules accepted: Orders

## 2019-03-04 NOTE — Telephone Encounter (Signed)
New Message   Pt c/o medication issue:  1. Name of Medication: Entresto 24-26mg  1 tablet twice a day  and Benazepril 10mg  1 tablet daily  2. How are you currently taking this medication (dosage and times per day)?   3. Are you having a reaction (difficulty breathing--STAT)? NO  4. What is your medication issue? Holly from [employer] calling due to the patient picking up Benazepril that was ordered almost a year ago, and also taking Entresto.  Those to meds shouldn't be taken together.  Please call to correct issue with pharmacy.

## 2019-03-05 ENCOUNTER — Telehealth: Payer: Self-pay | Admitting: Cardiology

## 2019-03-05 NOTE — Telephone Encounter (Signed)
Contacted patient and let him know I spoke with the pharmacist at [facility] and she said his rx will be ready shortly and they would send a text message. Informed pt to take the saving card with him for a free 1 month supply. She stated he has not received it in the mail yet. I told him to call the pharmacy once the rx is ready and see how much the prescription will cost. He voiced understanding.

## 2019-03-05 NOTE — Telephone Encounter (Signed)
New Message     Pt c/o medication issue:  1. Name of Medication: Brilinta   2. How are you currently taking this medication (dosage and times per day)? 2 x daily 90 mg   3. Are you having a reaction (difficulty breathing--STAT)? No   4. What is your medication issue? Pt is having allergy like symptoms and thinks it is from the medication. He says his legs are really tired

## 2019-03-05 NOTE — Telephone Encounter (Signed)
Spoke with the pt and made him aware of Georgina Peer A, Pharm-D response. He will follow his pcp's recommendation. Pt verbalized understanding and voiced appreciation for the call

## 2019-03-05 NOTE — Telephone Encounter (Signed)
Spoke with the pt. Pt sts that the last couple of days he has been experiencing "stuffy head", tinnitus, and his legs "feeling tired". He wonders if his medications specifically Brilinta could be the cause.  Pt denies swelling, rash, blurred vision.  He spoke with his pcp this morning and she suggested a nasal spray for his "stuffy head". He wanted to make sure that his current symptoms were not related to his recent cardiac event.  Pt denies cp, sob,palpitations. Adv the pt that I do not think his symptoms are cardiac related.  Adv the pt that there should be no interruption in Brilinta. I will fwd the message to our Pharm-D to advise.

## 2019-03-05 NOTE — Telephone Encounter (Signed)
Spoke with pharmacist and she stated the Adrian Pace is not ready to be picked up, but they will send the patient a text message shortly. Informed pharmacist that patient should have a savings card to get a 30 day supply for free. Informed her that Dr Stanford Breed started pt on this medication Monday and pt was transition from Benazepril to [facility] and pt is aware. She voiced understanding.

## 2019-03-05 NOTE — Telephone Encounter (Signed)
Brilinta not likely associated with "stuffy headedness. I do not see tinnitus listed as a side effect of the mediation either and thus would think that Brilinta is not likely the cause of these symptoms.

## 2019-03-09 ENCOUNTER — Ambulatory Visit (INDEPENDENT_AMBULATORY_CARE_PROVIDER_SITE_OTHER): Payer: Medicare Other | Admitting: *Deleted

## 2019-03-09 ENCOUNTER — Other Ambulatory Visit: Payer: Self-pay

## 2019-03-09 DIAGNOSIS — I5022 Chronic systolic (congestive) heart failure: Secondary | ICD-10-CM

## 2019-03-09 DIAGNOSIS — I255 Ischemic cardiomyopathy: Secondary | ICD-10-CM

## 2019-03-10 LAB — CUP PACEART REMOTE DEVICE CHECK
Battery Voltage: 2.92 V
Brady Statistic RV Percent Paced: 0.04 %
Date Time Interrogation Session: 20200512062702
HighPow Impedance: 304 Ohm
HighPow Impedance: 62 Ohm
Implantable Lead Implant Date: 20130111
Implantable Lead Location: 753860
Implantable Lead Model: 6935
Implantable Pulse Generator Implant Date: 20130111
Lead Channel Impedance Value: 399 Ohm
Lead Channel Pacing Threshold Amplitude: 0.5 V
Lead Channel Pacing Threshold Pulse Width: 0.4 ms
Lead Channel Sensing Intrinsic Amplitude: 8.125 mV
Lead Channel Sensing Intrinsic Amplitude: 8.125 mV
Lead Channel Setting Pacing Amplitude: 2.5 V
Lead Channel Setting Pacing Pulse Width: 0.4 ms
Lead Channel Setting Sensing Sensitivity: 0.3 mV

## 2019-03-11 LAB — BASIC METABOLIC PANEL
BUN/Creatinine Ratio: 9 — ABNORMAL LOW (ref 10–24)
BUN: 11 mg/dL (ref 8–27)
CO2: 24 mmol/L (ref 20–29)
Calcium: 9.4 mg/dL (ref 8.6–10.2)
Chloride: 104 mmol/L (ref 96–106)
Creatinine, Ser: 1.21 mg/dL (ref 0.76–1.27)
GFR calc Af Amer: 63 mL/min/{1.73_m2} (ref >59)
GFR calc non Af Amer: 55 mL/min/{1.73_m2} — ABNORMAL LOW (ref >59)
Glucose: 105 mg/dL — ABNORMAL HIGH (ref 65–99)
Potassium: 5.2 mmol/L (ref 3.5–5.2)
Sodium: 142 mmol/L (ref 134–144)

## 2019-03-12 ENCOUNTER — Other Ambulatory Visit: Payer: Self-pay

## 2019-03-12 DIAGNOSIS — I255 Ischemic cardiomyopathy: Secondary | ICD-10-CM

## 2019-03-19 ENCOUNTER — Telehealth: Payer: Self-pay | Admitting: Cardiology

## 2019-03-19 ENCOUNTER — Other Ambulatory Visit: Payer: Self-pay | Admitting: Cardiology

## 2019-03-19 LAB — BASIC METABOLIC PANEL
BUN/Creatinine Ratio: 9 — ABNORMAL LOW (ref 10–24)
BUN: 13 mg/dL (ref 8–27)
CO2: 22 mmol/L (ref 20–29)
Calcium: 9.6 mg/dL (ref 8.6–10.2)
Chloride: 102 mmol/L (ref 96–106)
Creatinine, Ser: 1.41 mg/dL — ABNORMAL HIGH (ref 0.76–1.27)
GFR calc Af Amer: 53 mL/min/{1.73_m2} — ABNORMAL LOW (ref >59)
GFR calc non Af Amer: 45 mL/min/{1.73_m2} — ABNORMAL LOW (ref >59)
Glucose: 101 mg/dL — ABNORMAL HIGH (ref 65–99)
Potassium: 5.4 mmol/L — ABNORMAL HIGH (ref 3.5–5.2)
Sodium: 139 mmol/L (ref 134–144)

## 2019-03-19 MED ORDER — BENAZEPRIL HCL 10 MG PO TABS: 10.0000 mg | ORAL_TABLET | Freq: Every day | ORAL | 3 refills | Status: DC

## 2019-03-19 NOTE — Telephone Encounter (Signed)
Mr Geary's K+ and SCr have drifted up on Entresto, despite low K+ diet.  I feel it would be best to stop the Entresto and resume Benazepril which he tolerated well in 48 hours  I explained this to him and he will comply.   Kerin Ransom PA-C 03/19/2019 6:07 PM

## 2019-03-23 ENCOUNTER — Other Ambulatory Visit: Payer: Self-pay | Admitting: Cardiology

## 2019-03-23 ENCOUNTER — Telehealth: Payer: Self-pay

## 2019-03-23 DIAGNOSIS — I10 Essential (primary) hypertension: Secondary | ICD-10-CM

## 2019-03-23 DIAGNOSIS — E875 Hyperkalemia: Secondary | ICD-10-CM

## 2019-03-23 MED ORDER — TICAGRELOR 90 MG PO TABS: 90.0000 mg | ORAL_TABLET | Freq: Two times a day (BID) | ORAL | 3 refills | Status: DC

## 2019-03-23 NOTE — Telephone Encounter (Signed)
OK, B/P looks perfect. He should have a BMP in two weeks to f/u his K+-I will order if you can let him know.  Kerin Ransom PA-C 03/23/2019 4:25 PM

## 2019-03-23 NOTE — Telephone Encounter (Signed)
Patient requested refill and wanted me to inform Lurena Joiner of of his bp running over 110's over high 60's and 70's.

## 2019-03-24 NOTE — Progress Notes (Signed)
Remote ICD transmission.   

## 2019-03-25 ENCOUNTER — Other Ambulatory Visit: Payer: Self-pay

## 2019-03-25 ENCOUNTER — Telehealth: Payer: Self-pay | Admitting: Pharmacist Clinician (PhC)/ Clinical Pharmacy Specialist

## 2019-03-25 DIAGNOSIS — E875 Hyperkalemia: Secondary | ICD-10-CM

## 2019-03-25 NOTE — Telephone Encounter (Signed)
Contacted patient and discussed Lurena Joiner recommendations, he voiced understanding. Lab slip mailed to patient.

## 2019-03-25 NOTE — Telephone Encounter (Signed)
Was scheduled to have a telephone follow up visit with patient regarding recent switch from benazepril to [place].  Since switching, patient had BMET drawn, which showed potassium rise to 5.4.  Because of this, the Delene Loll was discontinued and he was restarted on benazepril 10 mg.  He will repeat blood work in 2 weeks (has been back on benazepril for 3 days).  Today patient reports no problems with medications.  He checks his BP at home and has found it to be mostly 696-789 systolic, with an occasional reading in the 90's and as high as 128.  All diastolic readings in the 38'B as well as HR.    He does admit to occasional SOB with the Brilinta, usually relieved with Coke/Pepsi, never much of a problem.    Discussed the benefits of being on benazepril and carvedilol for his HF (EF 20-30%) and patient is aware to call us should his pressure drop to consistently < 90 systolic or he has other concerns.   Total time with patient :6 minutes

## 2019-03-27 ENCOUNTER — Telehealth: Payer: Self-pay | Admitting: Physician Assistant

## 2019-03-27 NOTE — Telephone Encounter (Signed)
Pt called stating he has restarted his benazepril as instructed by our pharmacy staff on Monday. BP was 426-834 systolic. On thurs, pressure has been 120-135/77. He is concerned about the elevated pressure. We discussed that he is still in normal range. He denies eating salt. I relayed to him that I did not want to make medication changes unless he is above 145 consistently, given his history of hypotension. He reluctantly agreed. I instructed to take 1/2 benazepril if his pressure was consistently above 145. I also let him know I will send this message to Dr. Tommy Medal for review. He expressed understanding of the plan.   Tami Lin Duke, PA-C 03/27/2019, 11:29 AM

## 2019-03-29 ENCOUNTER — Encounter: Payer: Medicare Other | Admitting: Internal Medicine

## 2019-03-30 NOTE — Telephone Encounter (Signed)
Spoke with patient.  He reports feeling well, no concerns.  BP dropped back down into normal range (72-897 systolic)

## 2019-04-01 ENCOUNTER — Other Ambulatory Visit: Payer: Self-pay | Admitting: Cardiology

## 2019-04-05 LAB — BASIC METABOLIC PANEL
BUN/Creatinine Ratio: 8 — ABNORMAL LOW (ref 10–24)
BUN: 11 mg/dL (ref 8–27)
CO2: 21 mmol/L (ref 20–29)
Calcium: 9.6 mg/dL (ref 8.6–10.2)
Chloride: 100 mmol/L (ref 96–106)
Creatinine, Ser: 1.36 mg/dL — ABNORMAL HIGH (ref 0.76–1.27)
GFR calc Af Amer: 55 mL/min/{1.73_m2} — ABNORMAL LOW (ref >59)
GFR calc non Af Amer: 47 mL/min/{1.73_m2} — ABNORMAL LOW (ref >59)
Glucose: 99 mg/dL (ref 65–99)
Potassium: 4.6 mmol/L (ref 3.5–5.2)
Sodium: 138 mmol/L (ref 134–144)

## 2019-04-06 ENCOUNTER — Encounter: Payer: Self-pay | Admitting: Cardiology

## 2019-04-07 ENCOUNTER — Other Ambulatory Visit: Payer: Self-pay | Admitting: Cardiology

## 2019-04-14 ENCOUNTER — Telehealth (HOSPITAL_COMMUNITY): Payer: Self-pay

## 2019-04-14 NOTE — Telephone Encounter (Signed)
Called and spoke with pt in regards to our Virtual Cardiac Rehab, pt stated he is unable to download the app. But would like for [place] to give him a call once Cardiac Rehab opens back up.

## 2019-04-23 ENCOUNTER — Telehealth (HOSPITAL_COMMUNITY): Payer: Self-pay

## 2019-04-23 NOTE — Telephone Encounter (Signed)
Pt insurance is active and benefits verified through [facility] Medicare. Co-pay $20.00, DED $0.00/$0.00 met, out of pocket $3,300.00/$578.22 met, co-insurance 0%. No pre-authorization required. [employer]port, 04/23/2019 @ 8:33AM, AJG#[id number]

## 2019-05-03 ENCOUNTER — Other Ambulatory Visit: Payer: Self-pay

## 2019-05-03 ENCOUNTER — Encounter: Payer: Self-pay | Admitting: Internal Medicine

## 2019-05-03 ENCOUNTER — Ambulatory Visit: Payer: Medicare Other | Admitting: Internal Medicine

## 2019-05-03 ENCOUNTER — Telehealth: Payer: Self-pay | Admitting: Internal Medicine

## 2019-05-03 VITALS — BP 104/70 | HR 81 | Ht 67.0 in | Wt 163.8 lb

## 2019-05-03 DIAGNOSIS — Z95810 Presence of automatic (implantable) cardiac defibrillator: Secondary | ICD-10-CM

## 2019-05-03 DIAGNOSIS — I472 Ventricular tachycardia: Secondary | ICD-10-CM

## 2019-05-03 DIAGNOSIS — I5020 Unspecified systolic (congestive) heart failure: Secondary | ICD-10-CM | POA: Diagnosis not present

## 2019-05-03 DIAGNOSIS — I10 Essential (primary) hypertension: Secondary | ICD-10-CM | POA: Diagnosis not present

## 2019-05-03 DIAGNOSIS — I4729 Other ventricular tachycardia: Secondary | ICD-10-CM

## 2019-05-03 LAB — CUP PACEART INCLINIC DEVICE CHECK
Battery Voltage: 2.9 V
Brady Statistic RV Percent Paced: 0.2 %
Date Time Interrogation Session: 20200706165847
Implantable Lead Implant Date: 20130111
Implantable Lead Location: 753860
Implantable Lead Model: 6935
Implantable Pulse Generator Implant Date: 20130111
Lead Channel Pacing Threshold Amplitude: 0.5 V
Lead Channel Pacing Threshold Pulse Width: 0.4 ms

## 2019-05-03 NOTE — Patient Instructions (Signed)
Medication Instructions:  Your physician recommends that you continue on your current medications as directed. Please refer to the Current Medication list given to you today.  Labwork: None ordered.  Testing/Procedures: None ordered.  Follow-Up: Your physician wants you to follow-up in: one year with Dr. Lovena Le.   You will receive a reminder letter in the mail two months in advance. If you don't receive a letter, please call our office to schedule the follow-up appointment.  Remote monitoring is used to monitor your ICD from home. This monitoring reduces the number of office visits required to check your device to one time per year. It allows [place] to keep an eye on the functioning of your device to ensure it is working properly. You are scheduled for a device check from home on 06/08/2019. You may send your transmission at any time that day. If you have a wireless device, the transmission will be sent automatically. After your physician reviews your transmission, you will receive a postcard with your next transmission date.  Any Other Special Instructions Will Be Listed Below (If Applicable).  If you need a refill on your cardiac medications before your next appointment, please call your pharmacy.

## 2019-05-03 NOTE — Progress Notes (Signed)
HPI Adrian Pace returns today for followup. He is a pleasant 85 yo man with an ICM, chronic systolic heart failure and VT, s/p ICD insertion. In the interim, he has done well with no chest pain. He c/o difficulty sleeping. He is using lorazepam. Allergies  Allergen Reactions  . Entresto [Sacubitril-Valsartan] Other (See Comments)    hyperkalemia  . Codeine Anxiety     Current Outpatient Medications  Medication Sig Dispense Refill  . benazepril (LOTENSIN) 10 MG tablet Take 1 tablet (10 mg total) by mouth daily. 90 tablet 3  . LORazepam (ATIVAN) 0.5 MG tablet Take 0.5-1 mg by mouth See admin instructions. Take 1-2 tablets as needed for Anxiety    . simvastatin (ZOCOR) 40 MG tablet TAKE ONE TABLET BY MOUTH DAILY AT 6PM 90 tablet 0  . ticagrelor (BRILINTA) 90 MG TABS tablet Take 1 tablet (90 mg total) by mouth 2 (two) times daily. 180 tablet 3  . zolpidem (AMBIEN) 10 MG tablet Take 10 mg by mouth at bedtime as needed for sleep.     Marland Kitchen aspirin 81 MG chewable tablet Chew 1 tablet (81 mg total) by mouth daily for 30 days. 30 tablet 0  . carvedilol (COREG) 12.5 MG tablet Take 1 tablet (12.5 mg total) by mouth 2 (two) times daily with a meal for 30 days. 60 tablet 0   No current facility-administered medications for this visit.      Past Medical History:  Diagnosis Date  . Anxiety    Takes Ativan as needed  . Cardiac dysrhythmia, unspecified   . CHF (congestive heart failure) ([place])   . Coronary artery disease    Hx of cardiac caths   . Enlarged prostate   . GERD (gastroesophageal reflux disease)    Takes prilosec  . Gout   . Heart attack ([place])   . HTN (hypertension)   . Hyperkalemia   . Hyperlipemia   . Ischemic cardiomyopathy   . LV dysfunction   . Myocardial infarct ([place]) 1988  . Panic attacks   . Personal history of peptic ulcer disease   . Renal insufficiency   . Syncope and collapse   . Ventricular tachycardia ([place])     ROS:   All systems reviewed and negative  except as noted in the HPI.   Past Surgical History:  Procedure Laterality Date  . CARDIAC CATHETERIZATION  2003  . CARDIAC DEFIBRILLATOR PLACEMENT    . COLONOSCOPY    . CORONARY ANGIOPLASTY    . CORONARY STENT INTERVENTION N/A 02/22/2019   Procedure: CORONARY STENT INTERVENTION;  Surgeon: Jettie Booze, MD;  Location: [facility];  Service: Cardiovascular;  Laterality: N/A;  . ESOPHAGOGASTRODUODENOSCOPY     with biopsy  . HERNIA REPAIR  01/01/2012  . INSERT / REPLACE / REMOVE PACEMAKER  2005   ICD; replaced in Jan 2013  . LEAD REVISION N/A 11/08/2011   Procedure: LEAD REVISION;  Surgeon: Thompson Grayer, MD;  Location: [facility] C[facility];  Service: Cardiovascular;  Laterality: N/A;  . LEFT HEART CATH AND CORONARY ANGIOGRAPHY N/A 02/22/2019   Procedure: LEFT HEART CATH AND CORONARY ANGIOGRAPHY;  Surgeon: Jettie Booze, MD;  Location: [facility];  Service: Cardiovascular;  Laterality: N/A;  . UMBILICAL HERNIA REPAIR    . UMBILICAL HERNIA REPAIR  01/01/2012   Procedure: HERNIA REPAIR UMBILICAL ADULT;  Surgeon: Merrie Roof, MD;  Location: [facility];  Service: General;  Laterality: N/A;  umbilical hernia repair with mesh  Family History  Problem Relation Age of Onset  . Heart attack Father        x2  . Heart disease Father   . Cancer Father        bladder and prostate  . Anesthesia problems Neg Hx   . Hypotension Neg Hx   . Malignant hyperthermia Neg Hx   . Pseudochol deficiency Neg Hx      Social History   Socioeconomic History  . Marital status: Married    Spouse name: Not on file  . Number of children: Not on file  . Years of education: Not on file  . Highest education level: Not on file  Occupational History  . Not on file  Social Needs  . Financial resource strain: Not on file  . Food insecurity    Worry: Not on file    Inability: Not on file  . Transportation needs    Medical: Not on file    Non-medical: Not on file  Tobacco Use  . Smoking  status: Never Smoker  . Smokeless tobacco: Never Used  Substance and Sexual Activity  . Alcohol use: Yes    Comment: occassionally  . Drug use: No  . Sexual activity: Not Currently  Lifestyle  . Physical activity    Days per week: Not on file    Minutes per session: Not on file  . Stress: Not on file  Relationships  . Social Herbalist on phone: Not on file    Gets together: Not on file    Attends religious service: Not on file    Active member of club or organization: Not on file    Attends meetings of clubs or organizations: Not on file    Relationship status: Not on file  . Intimate partner violence    Fear of current or ex partner: Not on file    Emotionally abused: Not on file    Physically abused: Not on file    Forced sexual activity: Not on file  Other Topics Concern  . Not on file  Social History Narrative  . Not on file     BP 104/70   Pulse 81   Ht 5\' 7"  (1.702 m)   Wt 163 lb 12.8 oz (74.3 kg)   SpO2 95%   BMI 25.65 kg/m   Physical Exam:  Well appearing 85 yo man, NAD HEENT: Unremarkable Neck:  6 cm JVD, no thyromegally Lymphatics:  No adenopathy Back:  No CVA tenderness Lungs:  Clear with no wheezes HEART:  Regular rate rhythm, no murmurs, no rubs, no clicks Abd:  soft, positive bowel sounds, no organomegally, no rebound, no guarding Ext:  2 plus pulses, no edema, no cyanosis, no clubbing Skin:  No rashes no nodules Neuro:  CN II through XII intact, motor grossly intact  EKG - nsr with old anterior MI  DEVICE  Normal device function.  See PaceArt for details.   Assess/Plan: 1. Chronic systolic heart failure - he has class 2 dyspnea. Vitals are good. He will maintain a low sodium diet. 2. ICM - he denies anginal symptoms. He is encouraged to increase his physical activity. 3. ICD - he is about 2 years from ERI.  4. VT - he told me he had VT before his last device was changed out. We will discuss/review prior to another device  insertion. He is currently without arrhythmia.  Adrian Pace.D.

## 2019-05-03 NOTE — Telephone Encounter (Signed)

## 2019-05-05 ENCOUNTER — Other Ambulatory Visit: Payer: Self-pay | Admitting: Internal Medicine

## 2019-05-05 ENCOUNTER — Telehealth (HOSPITAL_COMMUNITY): Payer: Self-pay | Admitting: *Deleted

## 2019-05-05 ENCOUNTER — Telehealth (HOSPITAL_COMMUNITY): Payer: Self-pay

## 2019-05-05 NOTE — Telephone Encounter (Signed)
Cardiac Rehab Medication Review by a Pharmacist  Does the patient  feel that his/her medications are working for him/her?  yes  Has the patient been experiencing any side effects to the medications prescribed?  no  Does the patient measure his/her own blood pressure or blood glucose at home?  yes - BP: 111/67, 91/62, 125/60, 131/75   Does the patient have any problems obtaining medications due to transportation or finances?   no  Understanding of regimen: excellent Understanding of indications: good Potential of compliance: good   Pharmacist comments: none    Vertis Kelch, PharmD PGY2 Cardiology Pharmacy Resident Phone [phone] 05/05/2019       9:13 AM  Please check [email or web address] for unit-specific pharmacist phone numbers

## 2019-05-05 NOTE — Telephone Encounter (Signed)
-----   Message from Lelon Perla, MD sent at 05/04/2019  6:22 PM EDT ----- Regarding: RE: Ok to participate on site cardiac rehab [employer] with precautions as outlined [facility]s  ----- Message ----- From: Rowe Pavy, RN Sent: 05/04/2019   4:34 PM EDT To: Lelon Perla, MD Subject: Ok to participate on site cardiac rehab        Dr.Crenshaw  We phasing to on-site cardiac rehab. A great deal of planning with advisement from our Medical Director - Dr. Radford Pax, CV Service line leadership, Infection Disease Control, Facilities, security,recommendations from American Association of Cardiac and Pulmonary Rehab (AACVPR) with the goal for optimal patient safety. Patients will have strict guidelines and criteria  they must adhere and follow.  Pt will wear a mask during exercise and practice social distancing. Pt will have to complete screening prior to entry into gym area.  Your pt expressed great interest  in facility cardiac rehab. Pt has a Covid risk score  7.  Do you feel this pt is appropriate to resume in facility cardiac rehab?  Any additional restrictions you feel are appropriate for this pt?   Thank you and we appreciate your input Maurice Small RN, BSN Cardiac and Pulmonary Rehab Nurse Navigator

## 2019-05-05 NOTE — Telephone Encounter (Signed)
Called to do a health history with patient in preparation for orientation to Phase II Cardiac Rehab.

## 2019-05-06 ENCOUNTER — Telehealth: Payer: Self-pay | Admitting: Cardiology

## 2019-05-06 ENCOUNTER — Other Ambulatory Visit: Payer: Self-pay

## 2019-05-06 ENCOUNTER — Encounter (HOSPITAL_COMMUNITY): Payer: Self-pay

## 2019-05-06 ENCOUNTER — Encounter (HOSPITAL_COMMUNITY)
Admission: RE | Admit: 2019-05-06 | Discharge: 2019-05-06 | Disposition: A | Discharge status: Home / Self Care | Payer: Medicare Other | Source: Ambulatory Visit | Attending: Cardiology | Admitting: Cardiology

## 2019-05-06 VITALS — Ht 67.0 in | Wt 160.3 lb

## 2019-05-06 DIAGNOSIS — I214 Non-ST elevation (NSTEMI) myocardial infarction: Secondary | ICD-10-CM | POA: Insufficient documentation

## 2019-05-06 DIAGNOSIS — Z955 Presence of coronary angioplasty implant and graft: Secondary | ICD-10-CM | POA: Insufficient documentation

## 2019-05-06 NOTE — Progress Notes (Signed)
Cardiac Individual Treatment Plan  Patient Details  Name: Adrian Pace MRN: [id number] Date of Birth: [birth year] Referring Provider:     CARDIAC REHAB PHASE II ORIENTATION from 05/06/2019 in [place]  Referring Provider  Dr. Stanford Breed      Initial Encounter Date:    CARDIAC REHAB PHASE II ORIENTATION from 05/06/2019 in [place]  Date  05/06/19      Visit Diagnosis: NSTEMI (non-ST elevated myocardial infarction) [facility])  Status post coronary artery stent placement  Patient's Home Medications on Admission:  Current Outpatient Medications:  .  benazepril (LOTENSIN) 10 MG tablet, Take 1 tablet (10 mg total) by mouth daily., Disp: 90 tablet, Rfl: 3 .  carvedilol (COREG) 12.5 MG tablet, Take 1 tablet (12.5 mg total) by mouth 2 (two) times daily with a meal for 30 days., Disp: 60 tablet, Rfl: 0 .  LORazepam (ATIVAN) 0.5 MG tablet, Take 0.5-1 mg by mouth See admin instructions. Take 1-2 tablets as needed for Anxiety, Disp: , Rfl:  .  simvastatin (ZOCOR) 40 MG tablet, TAKE ONE TABLET BY MOUTH DAILY AT 6PM, Disp: 90 tablet, Rfl: 0 .  ticagrelor (BRILINTA) 90 MG TABS tablet, Take 1 tablet (90 mg total) by mouth 2 (two) times daily., Disp: 180 tablet, Rfl: 3 .  zolpidem (AMBIEN) 10 MG tablet, Take 10 mg by mouth at bedtime as needed for sleep. , Disp: , Rfl:  .  aspirin 81 MG chewable tablet, Chew 1 tablet (81 mg total) by mouth daily for 30 days. (Patient taking differently: Chew 81 mg by mouth daily. ), Disp: 30 tablet, Rfl: 0  Past Medical History: Past Medical History:  Diagnosis Date  . Anxiety    Takes Ativan as needed  . Cardiac dysrhythmia, unspecified   . CHF (congestive heart failure) ([place])   . Coronary artery disease    Hx of cardiac caths   . Enlarged prostate   . GERD (gastroesophageal reflux disease)    Takes prilosec  . Gout   . Heart attack ([place])   . HTN (hypertension)   . Hyperkalemia   . Hyperlipemia    . Ischemic cardiomyopathy   . LV dysfunction   . Myocardial infarct ([place]) 1988  . Panic attacks   . Personal history of peptic ulcer disease   . Renal insufficiency   . Syncope and collapse   . Ventricular tachycardia ([facility])     Tobacco Use: Social History   Tobacco Use  Smoking Status Never Smoker  Smokeless Tobacco Never Used    Labs: Recent Review Flowsheet Data    Labs for ITP Cardiac and Pulmonary Rehab Latest Ref Rng & Units 08/26/2007 05/08/2010 01/16/2011 07/09/2011 02/21/2019   Cholestrol 0 - 200 mg/dL - 118 118 102 95   LDLCALC 0 - 99 mg/dL - 70 69 56 50   HDL >40 mg/dL - 34(A) 31(L) 31.10(L) 28(L)   Trlycerides <150 mg/dL - 78 88 76.0 83   Hemoglobin A1c 4.8 - 5.6 % - - - - 5.8(H)   HCO3 - 23.5 - - - -   TCO2 - 25 - - - -   ACIDBASEDEF - 2.0 - - - -      Capillary Blood Glucose: No results found for: GLUCAP   Exercise Target Goals: Exercise Program Goal: Individual exercise prescription set using results from initial 6 min walk test and THRR while considering  patient's activity barriers and safety.   Exercise Prescription Goal: Initial exercise  prescription builds to 30-45 minutes a day of aerobic activity, 2-3 days per week.  Home exercise guidelines will be given to patient during program as part of exercise prescription that the participant will acknowledge.  Activity Barriers & Risk Stratification: Activity Barriers & Cardiac Risk Stratification - 05/06/19 1137      Activity Barriers & Cardiac Risk Stratification   Activity Barriers  None    Cardiac Risk Stratification  High       6 Minute Walk: 6 Minute Walk    Row Name 05/06/19 1136         6 Minute Walk   Phase  Initial     Distance  1152 feet     Walk Time  6 minutes     # of Rest Breaks  0     MPH  2.18     METS  1.78     RPE  10     Perceived Dyspnea   0     VO2 Peak  6.24     Symptoms  No     Resting HR  78 bpm     Resting BP  122/64     Resting Oxygen Saturation   96 %      Exercise Oxygen Saturation  during 6 min walk  98 %     Max Ex. HR  86 bpm     Max Ex. BP  116/64     2 Minute Post BP  114/70        Oxygen Initial Assessment:   Oxygen Re-Evaluation:   Oxygen Discharge (Final Oxygen Re-Evaluation):   Initial Exercise Prescription: Initial Exercise Prescription - 05/06/19 1100      Date of Initial Exercise RX and Referring Provider   Date  05/06/19    Referring Provider  Dr. Stanford Breed    Expected Discharge Date  06/18/19      Treadmill   MPH  1.2    Grade  0    Minutes  15      NuStep   Level  2    SPM  75    Minutes  15    METs  1.8      Prescription Details   Frequency (times per week)  3    Duration  Progress to 30 minutes of continuous aerobic without signs/symptoms of physical distress      Intensity   THRR 40-80% of Max Heartrate  54-109    Ratings of Perceived Exertion  11-13      Progression   Progression  Continue to progress workloads to maintain intensity without signs/symptoms of physical distress.      Resistance Training   Training Prescription  Yes    Weight  3 lbs.     Reps  10-15       Perform Capillary Blood Glucose checks as needed.  Exercise Prescription Changes:   Exercise Comments:   Exercise Goals and Review: Exercise Goals    Row Name 05/06/19 1138             Exercise Goals   Increase Physical Activity  Yes       Intervention  Provide advice, education, support and counseling about physical activity/exercise needs.;Develop an individualized exercise prescription for aerobic and resistive training based on initial evaluation findings, risk stratification, comorbidities and participant's personal goals.       Expected Outcomes  Short Term: Attend rehab on a regular basis to increase amount of physical activity.;Long Term: Add in  home exercise to make exercise part of routine and to increase amount of physical activity.;Long Term: Exercising regularly at least 3-5 days a week.        Increase Strength and Stamina  Yes       Intervention  Provide advice, education, support and counseling about physical activity/exercise needs.;Develop an individualized exercise prescription for aerobic and resistive training based on initial evaluation findings, risk stratification, comorbidities and participant's personal goals.       Expected Outcomes  Short Term: Increase workloads from initial exercise prescription for resistance, speed, and METs.;Short Term: Perform resistance training exercises routinely during rehab and add in resistance training at home;Long Term: Improve cardiorespiratory fitness, muscular endurance and strength as measured by increased METs and functional capacity (6MWT)       Able to understand and use rate of perceived exertion (RPE) scale  Yes       Intervention  Provide education and explanation on how to use RPE scale       Expected Outcomes  Short Term: Able to use RPE daily in rehab to express subjective intensity level;Long Term:  Able to use RPE to guide intensity level when exercising independently       Knowledge and understanding of Target Heart Rate Range (THRR)  Yes       Intervention  Provide education and explanation of THRR including how the numbers were predicted and where they are located for reference       Expected Outcomes  Short Term: Able to state/look up THRR;Long Term: Able to use THRR to govern intensity when exercising independently;Short Term: Able to use daily as guideline for intensity in rehab       Able to check pulse independently  Yes       Intervention  Provide education and demonstration on how to check pulse in carotid and radial arteries.;Review the importance of being able to check your own pulse for safety during independent exercise       Expected Outcomes  Short Term: Able to explain why pulse checking is important during independent exercise;Long Term: Able to check pulse independently and accurately       Understanding of Exercise  Prescription  Yes       Intervention  Provide education, explanation, and written materials on patient's individual exercise prescription       Expected Outcomes  Short Term: Able to explain program exercise prescription;Long Term: Able to explain home exercise prescription to exercise independently          Exercise Goals Re-Evaluation :   Discharge Exercise Prescription (Final Exercise Prescription Changes):   Nutrition:  Target Goals: Understanding of nutrition guidelines, daily intake of sodium 1500mg , cholesterol 200mg , calories 30% from fat and 7% or less from saturated fats, daily to have 5 or more servings of fruits and vegetables.  Biometrics: Pre Biometrics - 05/06/19 1138      Pre Biometrics   Height  5\' 7"  (1.702 m)    Weight  72.7 kg    Waist Circumference  39.5 inches    Hip Circumference  38 inches    Waist to Hip Ratio  1.04 %    BMI (Calculated)  25.1    Triceps Skinfold  21 mm    % Body Fat  28.4 %    Grip Strength  36 kg    Flexibility  8 in    Single Leg Stand  30 seconds        Nutrition Therapy Plan and Nutrition  Goals:   Nutrition Assessments:   Nutrition Goals Re-Evaluation:   Nutrition Goals Re-Evaluation:   Nutrition Goals Discharge (Final Nutrition Goals Re-Evaluation):   Psychosocial: Target Goals: Acknowledge presence or absence of significant depression and/or stress, maximize coping skills, provide positive support system. Participant is able to verbalize types and ability to use techniques and skills needed for reducing stress and depression.  Initial Review & Psychosocial Screening: Initial Psych Review & Screening - 05/06/19 1105      Initial Review   Current issues with  None Identified      Family Dynamics   Good Support System?  Yes      Barriers   Psychosocial barriers to participate in program  There are no identifiable barriers or psychosocial needs.       Quality of Life Scores: Quality of Life - 05/06/19  1039      Quality of Life   Select  Quality of Life      Quality of Life Scores   Health/Function Pre  25 %    Socioeconomic Pre  27.36 %    Psych/Spiritual Pre  27.57 %    Family Pre  26.4 %    GLOBAL Pre  26.22 %      Scores of 19 and below usually indicate a poorer quality of life in these areas.  A difference of  2-3 points is a clinically meaningful difference.  A difference of 2-3 points in the total score of the Quality of Life Index has been associated with significant improvement in overall quality of life, self-image, physical symptoms, and general health in studies assessing change in quality of life.  PHQ-9: Recent Review Flowsheet Data    There is no flowsheet data to display.     Interpretation of Total Score  Total Score Depression Severity:  1-4 = Minimal depression, 5-9 = Mild depression, 10-14 = Moderate depression, 15-19 = Moderately severe depression, 20-27 = Severe depression   Psychosocial Evaluation and Intervention:   Psychosocial Re-Evaluation:   Psychosocial Discharge (Final Psychosocial Re-Evaluation):   Vocational Rehabilitation: Provide vocational rehab assistance to qualifying candidates.   Vocational Rehab Evaluation & Intervention: Vocational Rehab - 05/06/19 1211      Initial Vocational Rehab Evaluation & Intervention   Assessment shows need for Vocational Rehabilitation  No       Education: Education Goals: Education classes will be provided on a weekly basis, covering required topics. Participant will state understanding/return demonstration of topics presented.  Learning Barriers/Preferences: Learning Barriers/Preferences - 05/06/19 1139      Learning Barriers/Preferences   Learning Barriers  None    Learning Preferences  Audio;Group Instruction;Individual Instruction;Pictoral;Skilled Demonstration;Verbal Instruction;Written Material;Video       Education Topics: Count Your Pulse:  -Group instruction provided by verbal  instruction, demonstration, patient participation and written materials to support subject.  Instructors address importance of being able to find your pulse and how to count your pulse when at home without a heart monitor.  Patients get hands on experience counting their pulse with staff help and individually.   Heart Attack, Angina, and Risk Factor Modification:  -Group instruction provided by verbal instruction, video, and written materials to support subject.  Instructors address signs and symptoms of angina and heart attacks.    Also discuss risk factors for heart disease and how to make changes to improve heart health risk factors.   Functional Fitness:  -Group instruction provided by verbal instruction, demonstration, patient participation, and written materials to support subject.  Instructors  address safety measures for doing things around the house.  Discuss how to get up and down off the floor, how to pick things up properly, how to safely get out of a chair without assistance, and balance training.   Meditation and Mindfulness:  -Group instruction provided by verbal instruction, patient participation, and written materials to support subject.  Instructor addresses importance of mindfulness and meditation practice to help reduce stress and improve awareness.  Instructor also leads participants through a meditation exercise.    Stretching for Flexibility and Mobility:  -Group instruction provided by verbal instruction, patient participation, and written materials to support subject.  Instructors lead participants through series of stretches that are designed to increase flexibility thus improving mobility.  These stretches are additional exercise for major muscle groups that are typically performed during regular warm up and cool down.   Hands Only CPR:  -Group verbal, video, and participation provides a basic overview of AHA guidelines for community CPR. Role-play of emergencies allow  participants the opportunity to practice calling for help and chest compression technique with discussion of AED use.   Hypertension: -Group verbal and written instruction that provides a basic overview of hypertension including the most recent diagnostic guidelines, risk factor reduction with self-care instructions and medication management.    Nutrition I class: Heart Healthy Eating:  -Group instruction provided by PowerPoint slides, verbal discussion, and written materials to support subject matter. The instructor gives an explanation and review of the Therapeutic Lifestyle Changes diet recommendations, which includes a discussion on lipid goals, dietary fat, sodium, fiber, plant stanol/sterol esters, sugar, and the components of a well-balanced, healthy diet.   Nutrition II class: Lifestyle Skills:  -Group instruction provided by PowerPoint slides, verbal discussion, and written materials to support subject matter. The instructor gives an explanation and review of label reading, grocery shopping for heart health, heart healthy recipe modifications, and ways to make healthier choices when eating out.   Diabetes Question & Answer:  -Group instruction provided by PowerPoint slides, verbal discussion, and written materials to support subject matter. The instructor gives an explanation and review of diabetes co-morbidities, pre- and post-prandial blood glucose goals, pre-exercise blood glucose goals, signs, symptoms, and treatment of hypoglycemia and hyperglycemia, and foot care basics.   Diabetes Blitz:  -Group instruction provided by PowerPoint slides, verbal discussion, and written materials to support subject matter. The instructor gives an explanation and review of the physiology behind type 1 and type 2 diabetes, diabetes medications and rational behind using different medications, pre- and post-prandial blood glucose recommendations and Hemoglobin A1c goals, diabetes diet, and exercise  including blood glucose guidelines for exercising safely.    Portion Distortion:  -Group instruction provided by PowerPoint slides, verbal discussion, written materials, and food models to support subject matter. The instructor gives an explanation of serving size versus portion size, changes in portions sizes over the last 20 years, and what consists of a serving from each food group.   Stress Management:  -Group instruction provided by verbal instruction, video, and written materials to support subject matter.  Instructors review role of stress in heart disease and how to cope with stress positively.     Exercising on Your Own:  -Group instruction provided by verbal instruction, power point, and written materials to support subject.  Instructors discuss benefits of exercise, components of exercise, frequency and intensity of exercise, and end points for exercise.  Also discuss use of nitroglycerin and activating EMS.  Review options of places to exercise outside of  rehab.  Review guidelines for sex with heart disease.   Cardiac Drugs I:  -Group instruction provided by verbal instruction and written materials to support subject.  Instructor reviews cardiac drug classes: antiplatelets, anticoagulants, beta blockers, and statins.  Instructor discusses reasons, side effects, and lifestyle considerations for each drug class.   Cardiac Drugs II:  -Group instruction provided by verbal instruction and written materials to support subject.  Instructor reviews cardiac drug classes: angiotensin converting enzyme inhibitors (ACE-I), angiotensin II receptor blockers (ARBs), nitrates, and calcium channel blockers.  Instructor discusses reasons, side effects, and lifestyle considerations for each drug class.   Anatomy and Physiology of the Circulatory System:  Group verbal and written instruction and models provide basic cardiac anatomy and physiology, with the coronary electrical and arterial systems.  Review of: AMI, Angina, Valve disease, Heart Failure, Peripheral Artery Disease, Cardiac Arrhythmia, Pacemakers, and the ICD.   Other Education:  -Group or individual verbal, written, or video instructions that support the educational goals of the cardiac rehab program.   Holiday Eating Survival Tips:  -Group instruction provided by PowerPoint slides, verbal discussion, and written materials to support subject matter. The instructor gives patients tips, tricks, and techniques to help them not only survive but enjoy the holidays despite the onslaught of food that accompanies the holidays.   Knowledge Questionnaire Score: Knowledge Questionnaire Score - 05/06/19 1039      Knowledge Questionnaire Score   Pre Score  23/24       Core Components/Risk Factors/Patient Goals at Admission: Personal Goals and Risk Factors at Admission - 05/06/19 1139      Core Components/Risk Factors/Patient Goals on Admission    Weight Management  Yes;Weight Maintenance    Admit Weight  160 lb 4.4 oz (72.7 kg)    Hypertension  Yes    Intervention  Provide education on lifestyle modifcations including regular physical activity/exercise, weight management, moderate sodium restriction and increased consumption of fresh fruit, vegetables, and low fat dairy, alcohol moderation, and smoking cessation.;Monitor prescription use compliance.    Expected Outcomes  Short Term: Continued assessment and intervention until BP is < 140/57mm HG in hypertensive participants. < 130/29mm HG in hypertensive participants with diabetes, heart failure or chronic kidney disease.;Long Term: Maintenance of blood pressure at goal levels.    Lipids  Yes    Intervention  Provide education and support for participant on nutrition & aerobic/resistive exercise along with prescribed medications to achieve LDL 70mg , HDL >40mg .    Expected Outcomes  Short Term: Participant states understanding of desired cholesterol values and is compliant with  medications prescribed. Participant is following exercise prescription and nutrition guidelines.;Long Term: Cholesterol controlled with medications as prescribed, with individualized exercise RX and with personalized nutrition plan. Value goals: LDL < 70mg , HDL > 40 mg.    Stress  Yes    Intervention  Offer individual and/or small group education and counseling on adjustment to heart disease, stress management and health-related lifestyle change. Teach and support self-help strategies.;Refer participants experiencing significant psychosocial distress to appropriate mental health specialists for further evaluation and treatment. When possible, include family members and significant others in education/counseling sessions.       Core Components/Risk Factors/Patient Goals Review:    Core Components/Risk Factors/Patient Goals at Discharge (Final Review):    ITP Comments: ITP Comments    Row Name 05/06/19 0922           ITP Comments  Dr. Fransico Him, Medical Director  Comments: Patient attended orientation on 05/06/2019 to review rules and guidelines for program.  Completed 6 minute walk test, Intitial ITP, and exercise prescription.  VSS. Telemetry-SR.  Asymptomatic. Safety measures and social distancing in place per CDC guidelines.

## 2019-05-06 NOTE — Telephone Encounter (Signed)
Just noticed that this was a duplicate message.  Adrian Pace, [facility]     05/06/19 12:30 PM Note Yes that is just fine

## 2019-05-06 NOTE — Telephone Encounter (Signed)
Yes that is just fine

## 2019-05-06 NOTE — Telephone Encounter (Signed)
This should be okay to do correct? Thanks!

## 2019-05-06 NOTE — Telephone Encounter (Signed)
Called patient, advised of message.  Patient verbalized understanding.  

## 2019-05-06 NOTE — Telephone Encounter (Signed)
New Message    Pt c/o medication issue:  1. Name of Medication: Aspirin  2. How are you currently taking this medication (dosage and times per day)? 81mg  1 tablet by mouth for 30 days  3. Are you having a reaction (difficulty breathing--STAT)? No  4. What is your medication issue?  Patient would like to know if he could start taking regular aspirin instead of chewable aspirins.

## 2019-05-06 NOTE — Progress Notes (Signed)
Medications reviewed with the patient. Niacin added as an allergy per the patient's request. Adrian Pace says that his is taking a regular aspirin rather than a chewable and wants to know if that's okay. Patient also asking about a his coreg and aspirin as he say the instructions says for him to take his medications for 30 days. Adrian Pace is taking his medications as prescribed . Dr Jacalyn Lefevre office called and notified of the patient's questions.Barnet Pall, RN,BSN 05/06/2019 12:26 PM

## 2019-05-06 NOTE — Telephone Encounter (Signed)
S/w maria from cardiac rehab pt is asking if he is just taking ASA and Coreg for 30 days and if he needs to continue taking the chewable ASA. Informed Verdis Frederickson that this was a glitch from the new update. But she wanted me to send the message anyway for a written response from MD please advuse

## 2019-05-10 ENCOUNTER — Encounter (HOSPITAL_COMMUNITY)
Admission: RE | Admit: 2019-05-10 | Discharge: 2019-05-10 | Disposition: A | Discharge status: Home / Self Care | Payer: Medicare Other | Source: Ambulatory Visit | Attending: Cardiology | Admitting: Cardiology

## 2019-05-10 ENCOUNTER — Other Ambulatory Visit: Payer: Self-pay

## 2019-05-10 DIAGNOSIS — Z955 Presence of coronary angioplasty implant and graft: Secondary | ICD-10-CM

## 2019-05-10 DIAGNOSIS — I214 Non-ST elevation (NSTEMI) myocardial infarction: Secondary | ICD-10-CM

## 2019-05-10 NOTE — Progress Notes (Signed)
Daily Session Note  Patient Details  Name: Adrian Pace MRN: [id number] Date of Birth: [birth year] Referring Provider:     CARDIAC REHAB PHASE II ORIENTATION from 05/06/2019 in [place]  Referring Provider  Dr. Stanford Breed      Encounter Date: 05/10/2019  Check In: Session Check In - 05/10/19 1510      Check-In   Supervising physician immediately available to respond to emergencies  [facility] immediately available    Physician(s)  Dr. Louanne Belton    Location  [facility]    Staff Present  Barnet Pall, RN, BSN;Brittany Durene Fruits, BS, ACSM CEP, Exercise Physiologist;Tyara Carol Ada, MS,ACSM CEP, Exercise Physiologist;Kristie Bracewell Karle Starch, RN, BSN    Virtual Visit  No    Medication changes reported      No    Fall or balance concerns reported     No    Tobacco Cessation  No Change    Warm-up and Cool-down  Performed on first and last piece of equipment    Resistance Training Performed  Yes    VAD Patient?  No    PAD/SET Patient?  No      Pain Assessment   Currently in Pain?  No/denies    Multiple Pain Sites  No       Capillary Blood Glucose: No results found for this or any previous visit (from the past 24 hour(s)).    Social History   Tobacco Use  Smoking Status Never Smoker  Smokeless Tobacco Never Used    Goals Met:  Exercise tolerated well  Goals Unmet:  Not Applicable  Comments: Pt started cardiac rehab today.  Pt tolerated light exercise without difficulty. VSS, telemetry-SR, asymptomatic.  Medication list reconciled. Pt denies barriers to medicaiton compliance.  PSYCHOSOCIAL ASSESSMENT:  PHQ-0. Pt exhibits positive coping skills, hopeful outlook with supportive family. No psychosocial needs identified at this time, no psychosocial interventions necessary.  Pt oriented to exercise equipment and routine.    Understanding verbalized.    Dr. Fransico Him is Medical Director for Cardiac Rehab at [facility].

## 2019-05-12 ENCOUNTER — Encounter (HOSPITAL_COMMUNITY)
Admission: RE | Admit: 2019-05-12 | Discharge: 2019-05-12 | Disposition: A | Discharge status: Home / Self Care | Payer: Medicare Other | Source: Ambulatory Visit | Attending: Cardiology | Admitting: Cardiology

## 2019-05-12 ENCOUNTER — Other Ambulatory Visit: Payer: Self-pay

## 2019-05-12 DIAGNOSIS — I214 Non-ST elevation (NSTEMI) myocardial infarction: Secondary | ICD-10-CM | POA: Diagnosis not present

## 2019-05-12 DIAGNOSIS — Z955 Presence of coronary angioplasty implant and graft: Secondary | ICD-10-CM

## 2019-05-14 ENCOUNTER — Other Ambulatory Visit: Payer: Self-pay

## 2019-05-14 ENCOUNTER — Encounter (HOSPITAL_COMMUNITY): Payer: Medicare Other

## 2019-05-14 ENCOUNTER — Encounter (HOSPITAL_COMMUNITY)
Admission: RE | Admit: 2019-05-14 | Discharge: 2019-05-14 | Disposition: A | Discharge status: Home / Self Care | Payer: Medicare Other | Source: Ambulatory Visit | Attending: Cardiology | Admitting: Cardiology

## 2019-05-14 DIAGNOSIS — Z955 Presence of coronary angioplasty implant and graft: Secondary | ICD-10-CM

## 2019-05-14 DIAGNOSIS — I214 Non-ST elevation (NSTEMI) myocardial infarction: Secondary | ICD-10-CM

## 2019-05-16 ENCOUNTER — Other Ambulatory Visit: Payer: Self-pay | Admitting: Cardiology

## 2019-05-17 ENCOUNTER — Encounter (HOSPITAL_COMMUNITY)
Admission: RE | Admit: 2019-05-17 | Discharge: 2019-05-17 | Disposition: A | Discharge status: Home / Self Care | Payer: Medicare Other | Source: Ambulatory Visit | Attending: Cardiology | Admitting: Cardiology

## 2019-05-17 ENCOUNTER — Other Ambulatory Visit: Payer: Self-pay

## 2019-05-17 DIAGNOSIS — Z955 Presence of coronary angioplasty implant and graft: Secondary | ICD-10-CM

## 2019-05-17 DIAGNOSIS — I214 Non-ST elevation (NSTEMI) myocardial infarction: Secondary | ICD-10-CM

## 2019-05-19 ENCOUNTER — Other Ambulatory Visit: Payer: Self-pay

## 2019-05-19 ENCOUNTER — Encounter (HOSPITAL_COMMUNITY)
Admission: RE | Admit: 2019-05-19 | Discharge: 2019-05-19 | Disposition: A | Discharge status: Home / Self Care | Payer: Medicare Other | Source: Ambulatory Visit | Attending: Cardiology | Admitting: Cardiology

## 2019-05-19 DIAGNOSIS — I214 Non-ST elevation (NSTEMI) myocardial infarction: Secondary | ICD-10-CM

## 2019-05-19 DIAGNOSIS — Z955 Presence of coronary angioplasty implant and graft: Secondary | ICD-10-CM

## 2019-05-20 NOTE — Progress Notes (Signed)
Cardiac Individual Treatment Plan  Patient Details  Name: Adrian Pace MRN: [id number] Date of Birth: October 17, 1933 Referring Provider:     CARDIAC REHAB PHASE II ORIENTATION from 05/06/2019 in [place]  Referring Provider  Dr. Stanford Breed      Initial Encounter Date:    CARDIAC REHAB PHASE II ORIENTATION from 05/06/2019 in [place]  Date  05/06/19      Visit Diagnosis: NSTEMI (non-ST elevated myocardial infarction) [facility])  Status post coronary artery stent placement  Patient's Home Medications on Admission:  Current Outpatient Medications:  .  aspirin 81 MG chewable tablet, Chew 1 tablet (81 mg total) by mouth daily for 30 days. (Patient taking differently: Chew 81 mg by mouth daily. ), Disp: 30 tablet, Rfl: 0 .  benazepril (LOTENSIN) 10 MG tablet, TAKE ONE TABLET BY MOUTH DAILY, Disp: 90 tablet, Rfl: 3 .  carvedilol (COREG) 12.5 MG tablet, Take 1 tablet (12.5 mg total) by mouth 2 (two) times daily with a meal for 30 days., Disp: 60 tablet, Rfl: 0 .  LORazepam (ATIVAN) 0.5 MG tablet, Take 0.5-1 mg by mouth See admin instructions. Take 1-2 tablets as needed for Anxiety, Disp: , Rfl:  .  simvastatin (ZOCOR) 40 MG tablet, TAKE ONE TABLET BY MOUTH DAILY AT 6PM, Disp: 90 tablet, Rfl: 0 .  ticagrelor (BRILINTA) 90 MG TABS tablet, Take 1 tablet (90 mg total) by mouth 2 (two) times daily., Disp: 180 tablet, Rfl: 3 .  zolpidem (AMBIEN) 10 MG tablet, Take 10 mg by mouth at bedtime as needed for sleep. , Disp: , Rfl:   Past Medical History: Past Medical History:  Diagnosis Date  . Anxiety    Takes Ativan as needed  . Cardiac dysrhythmia, unspecified   . CHF (congestive heart failure) ([place])   . Coronary artery disease    Hx of cardiac caths   . Enlarged prostate   . GERD (gastroesophageal reflux disease)    Takes prilosec  . Gout   . Heart attack ([place])   . HTN (hypertension)   . Hyperkalemia   . Hyperlipemia   .  Ischemic cardiomyopathy   . LV dysfunction   . Myocardial infarct ([place]) 1988  . Panic attacks   . Personal history of peptic ulcer disease   . Renal insufficiency   . Syncope and collapse   . Ventricular tachycardia ([facility])     Tobacco Use: Social History   Tobacco Use  Smoking Status Never Smoker  Smokeless Tobacco Never Used    Labs: Recent Review Flowsheet Data    Labs for ITP Cardiac and Pulmonary Rehab Latest Ref Rng & Units 08/26/2007 05/08/2010 01/16/2011 07/09/2011 02/21/2019   Cholestrol 0 - 200 mg/dL - 118 118 102 95   LDLCALC 0 - 99 mg/dL - 70 69 56 50   HDL >40 mg/dL - 34(A) 31(L) 31.10(L) 28(L)   Trlycerides <150 mg/dL - 78 88 76.0 83   Hemoglobin A1c 4.8 - 5.6 % - - - - 5.8(H)   HCO3 - 23.5 - - - -   TCO2 - 25 - - - -   ACIDBASEDEF - 2.0 - - - -      Capillary Blood Glucose: No results found for: GLUCAP   Exercise Target Goals: Exercise Program Goal: Individual exercise prescription set using results from initial 6 min walk test and THRR while considering  patient's activity barriers and safety.   Exercise Prescription Goal: Initial exercise prescription builds to  30-45 minutes a day of aerobic activity, 2-3 days per week.  Home exercise guidelines will be given to patient during program as part of exercise prescription that the participant will acknowledge.  Activity Barriers & Risk Stratification: Activity Barriers & Cardiac Risk Stratification - 05/06/19 1137      Activity Barriers & Cardiac Risk Stratification   Activity Barriers  None    Cardiac Risk Stratification  High       6 Minute Walk: 6 Minute Walk    Row Name 05/06/19 1136         6 Minute Walk   Phase  Initial     Distance  1152 feet     Walk Time  6 minutes     # of Rest Breaks  0     MPH  2.18     METS  1.78     RPE  10     Perceived Dyspnea   0     VO2 Peak  6.24     Symptoms  No     Resting HR  78 bpm     Resting BP  122/64     Resting Oxygen Saturation   96 %      Exercise Oxygen Saturation  during 6 min walk  98 %     Max Ex. HR  86 bpm     Max Ex. BP  116/64     2 Minute Post BP  114/70        Oxygen Initial Assessment:   Oxygen Re-Evaluation:   Oxygen Discharge (Final Oxygen Re-Evaluation):   Initial Exercise Prescription: Initial Exercise Prescription - 05/06/19 1100      Date of Initial Exercise RX and Referring Provider   Date  05/06/19    Referring Provider  Dr. Stanford Breed    Expected Discharge Date  06/18/19      Treadmill   MPH  1.2    Grade  0    Minutes  15      NuStep   Level  2    SPM  75    Minutes  15    METs  1.8      Prescription Details   Frequency (times per week)  3    Duration  Progress to 30 minutes of continuous aerobic without signs/symptoms of physical distress      Intensity   THRR 40-80% of Max Heartrate  54-109    Ratings of Perceived Exertion  11-13      Progression   Progression  Continue to progress workloads to maintain intensity without signs/symptoms of physical distress.      Resistance Training   Training Prescription  Yes    Weight  3 lbs.     Reps  10-15       Perform Capillary Blood Glucose checks as needed.  Exercise Prescription Changes: Exercise Prescription Changes    Row Name 05/10/19 1600 05/19/19 1600           Response to Exercise   Blood Pressure (Admit)  118/70  104/60      Blood Pressure (Exercise)  124/74  122/68      Blood Pressure (Exit)  104/62  110/62      Heart Rate (Admit)  81 bpm  86 bpm      Heart Rate (Exercise)  104 bpm  104 bpm      Heart Rate (Exit)  88 bpm  82 bpm      Rating of Perceived  Exertion (Exercise)  12  12      Symptoms  none  none      Comments  Pt first day of exercise  -      Duration  Continue with 30 min of aerobic exercise without signs/symptoms of physical distress.  Continue with 30 min of aerobic exercise without signs/symptoms of physical distress.      Intensity  THRR unchanged  THRR unchanged        Progression    Progression  Continue to progress workloads to maintain intensity without signs/symptoms of physical distress.  Continue to progress workloads to maintain intensity without signs/symptoms of physical distress.      Average METs  2.2  2.6        Resistance Training   Training Prescription  Yes  No      Weight  3 lbs.   -      Reps  10-15  -      Time  10 Minutes  -        Treadmill   MPH  2  2.2      Grade  0  2      Minutes  15  15        NuStep   Level  2  3      SPM  75  85      Minutes  15  15      METs  1.9  2.1        Home Exercise Plan   Plans to continue exercise at  -  Home (comment)      Frequency  -  Add 2 additional days to program exercise sessions.      Initial Home Exercises Provided  -  05/19/19         Exercise Comments: Exercise Comments    Row Name 05/10/19 1613 05/19/19 1613         Exercise Comments  Pt first day of exercise. Tolerated exercise well.  Reviewed goals and home exercise with Pt. Pt expressed understanding.         Exercise Goals and Review: Exercise Goals    Row Name 05/06/19 1138             Exercise Goals   Increase Physical Activity  Yes       Intervention  Provide advice, education, support and counseling about physical activity/exercise needs.;Develop an individualized exercise prescription for aerobic and resistive training based on initial evaluation findings, risk stratification, comorbidities and participant's personal goals.       Expected Outcomes  Short Term: Attend rehab on a regular basis to increase amount of physical activity.;Long Term: Add in home exercise to make exercise part of routine and to increase amount of physical activity.;Long Term: Exercising regularly at least 3-5 days a week.       Increase Strength and Stamina  Yes       Intervention  Provide advice, education, support and counseling about physical activity/exercise needs.;Develop an individualized exercise prescription for aerobic and resistive  training based on initial evaluation findings, risk stratification, comorbidities and participant's personal goals.       Expected Outcomes  Short Term: Increase workloads from initial exercise prescription for resistance, speed, and METs.;Short Term: Perform resistance training exercises routinely during rehab and add in resistance training at home;Long Term: Improve cardiorespiratory fitness, muscular endurance and strength as measured by increased METs and functional capacity (6MWT)       Able  to understand and use rate of perceived exertion (RPE) scale  Yes       Intervention  Provide education and explanation on how to use RPE scale       Expected Outcomes  Short Term: Able to use RPE daily in rehab to express subjective intensity level;Long Term:  Able to use RPE to guide intensity level when exercising independently       Knowledge and understanding of Target Heart Rate Range (THRR)  Yes       Intervention  Provide education and explanation of THRR including how the numbers were predicted and where they are located for reference       Expected Outcomes  Short Term: Able to state/look up THRR;Long Term: Able to use THRR to govern intensity when exercising independently;Short Term: Able to use daily as guideline for intensity in rehab       Able to check pulse independently  Yes       Intervention  Provide education and demonstration on how to check pulse in carotid and radial arteries.;Review the importance of being able to check your own pulse for safety during independent exercise       Expected Outcomes  Short Term: Able to explain why pulse checking is important during independent exercise;Long Term: Able to check pulse independently and accurately       Understanding of Exercise Prescription  Yes       Intervention  Provide education, explanation, and written materials on patient's individual exercise prescription       Expected Outcomes  Short Term: Able to explain program exercise  prescription;Long Term: Able to explain home exercise prescription to exercise independently          Exercise Goals Re-Evaluation : Exercise Goals Re-Evaluation    Row Name 05/10/19 1611 05/19/19 1611           Exercise Goal Re-Evaluation   Exercise Goals Review  Increase Physical Activity;Increase Strength and Stamina;Able to understand and use rate of perceived exertion (RPE) scale;Knowledge and understanding of Target Heart Rate Range (THRR);Understanding of Exercise Prescription  Increase Physical Activity;Increase Strength and Stamina;Able to understand and use rate of perceived exertion (RPE) scale;Knowledge and understanding of Target Heart Rate Range (THRR);Able to check pulse independently;Understanding of Exercise Prescription      Comments  Pt first day of exercise.Pt tolerated exercise well. Pt understands RPE scale and THRR.  Reviewed goals and home exercise with Pt. Pt stated he is not exercising on days he does not come to CR. Encouraged Pt to walk 2 days per week for 30 minuteds in addition to CR.      Expected Outcomes  Will continue to monitor and progress Pt as tolerated.  Will continue to monitor and progress Pt as tolerated.         Discharge Exercise Prescription (Final Exercise Prescription Changes): Exercise Prescription Changes - 05/19/19 1600      Response to Exercise   Blood Pressure (Admit)  104/60    Blood Pressure (Exercise)  122/68    Blood Pressure (Exit)  110/62    Heart Rate (Admit)  86 bpm    Heart Rate (Exercise)  104 bpm    Heart Rate (Exit)  82 bpm    Rating of Perceived Exertion (Exercise)  12    Symptoms  none    Duration  Continue with 30 min of aerobic exercise without signs/symptoms of physical distress.    Intensity  THRR unchanged      Progression  Progression  Continue to progress workloads to maintain intensity without signs/symptoms of physical distress.    Average METs  2.6      Resistance Training   Training Prescription  No       Treadmill   MPH  2.2    Grade  2    Minutes  15      NuStep   Level  3    SPM  85    Minutes  15    METs  2.1      Home Exercise Plan   Plans to continue exercise at  Home (comment)    Frequency  Add 2 additional days to program exercise sessions.    Initial Home Exercises Provided  05/19/19       Nutrition:  Target Goals: Understanding of nutrition guidelines, daily intake of sodium 1500mg , cholesterol 200mg , calories 30% from fat and 7% or less from saturated fats, daily to have 5 or more servings of fruits and vegetables.  Biometrics: Pre Biometrics - 05/06/19 1138      Pre Biometrics   Height  5\' 7"  (1.702 m)    Weight  72.7 kg    Waist Circumference  39.5 inches    Hip Circumference  38 inches    Waist to Hip Ratio  1.04 %    BMI (Calculated)  25.1    Triceps Skinfold  21 mm    % Body Fat  28.4 %    Grip Strength  36 kg    Flexibility  8 in    Single Leg Stand  30 seconds        Nutrition Therapy Plan and Nutrition Goals:   Nutrition Assessments:   Nutrition Goals Re-Evaluation:   Nutrition Goals Re-Evaluation:   Nutrition Goals Discharge (Final Nutrition Goals Re-Evaluation):   Psychosocial: Target Goals: Acknowledge presence or absence of significant depression and/or stress, maximize coping skills, provide positive support system. Participant is able to verbalize types and ability to use techniques and skills needed for reducing stress and depression.  Initial Review & Psychosocial Screening: Initial Psych Review & Screening - 05/06/19 1105      Initial Review   Current issues with  None Identified      Family Dynamics   Good Support System?  Yes      Barriers   Psychosocial barriers to participate in program  There are no identifiable barriers or psychosocial needs.       Quality of Life Scores: Quality of Life - 05/06/19 1039      Quality of Life   Select  Quality of Life      Quality of Life Scores   Health/Function Pre   25 %    Socioeconomic Pre  27.36 %    Psych/Spiritual Pre  27.57 %    Family Pre  26.4 %    GLOBAL Pre  26.22 %      Scores of 19 and below usually indicate a poorer quality of life in these areas.  A difference of  2-3 points is a clinically meaningful difference.  A difference of 2-3 points in the total score of the Quality of Life Index has been associated with significant improvement in overall quality of life, self-image, physical symptoms, and general health in studies assessing change in quality of life.  PHQ-9: Recent Review Flowsheet Data    Depression screen [facility] 2/9 05/10/2019   Decreased Interest 0   Down, Depressed, Hopeless 0   PHQ - 2 Score 0  Interpretation of Total Score  Total Score Depression Severity:  1-4 = Minimal depression, 5-9 = Mild depression, 10-14 = Moderate depression, 15-19 = Moderately severe depression, 20-27 = Severe depression   Psychosocial Evaluation and Intervention: Psychosocial Evaluation - 05/10/19 1529      Psychosocial Evaluation & Interventions   Interventions  Encouraged to exercise with the program and follow exercise prescription    Comments  No psychosocial needs identified. Adrian Pace enjoys reading and fishing.    Expected Outcomes  Adrian Pace will maintain a positive outlook with good coping skills.    Continue Psychosocial Services   No Follow up required       Psychosocial Re-Evaluation: Psychosocial Re-Evaluation    [place] Name 05/17/19 [phone]             Psychosocial Re-Evaluation   Current issues with  None Identified       Comments  No psychosocial needs identified.       Expected Outcomes  Adrian Pace will maintain a positive outlook with good coping skills.       Interventions  Encouraged to attend Cardiac Rehabilitation for the exercise       Continue Psychosocial Services   No Follow up required          Psychosocial Discharge (Final Psychosocial Re-Evaluation): Psychosocial Re-Evaluation - 05/17/19 9604       Psychosocial Re-Evaluation   Current issues with  None Identified    Comments  No psychosocial needs identified.    Expected Outcomes  Adrian Pace will maintain a positive outlook with good coping skills.    Interventions  Encouraged to attend Cardiac Rehabilitation for the exercise    Continue Psychosocial Services   No Follow up required       Vocational Rehabilitation: Provide vocational rehab assistance to qualifying candidates.   Vocational Rehab Evaluation & Intervention: Vocational Rehab - 05/06/19 1211      Initial Vocational Rehab Evaluation & Intervention   Assessment shows need for Vocational Rehabilitation  No       Education: Education Goals: Education classes will be provided on a weekly basis, covering required topics. Participant will state understanding/return demonstration of topics presented.  Learning Barriers/Preferences: Learning Barriers/Preferences - 05/06/19 1139      Learning Barriers/Preferences   Learning Barriers  None    Learning Preferences  Audio;Group Instruction;Individual Instruction;Pictoral;Skilled Demonstration;Verbal Instruction;Written Material;Video       Education Topics: Count Your Pulse:  -Group instruction provided by verbal instruction, demonstration, patient participation and written materials to support subject.  Instructors address importance of being able to find your pulse and how to count your pulse when at home without a heart monitor.  Patients get hands on experience counting their pulse with staff help and individually.   Heart Attack, Angina, and Risk Factor Modification:  -Group instruction provided by verbal instruction, video, and written materials to support subject.  Instructors address signs and symptoms of angina and heart attacks.    Also discuss risk factors for heart disease and how to make changes to improve heart health risk factors.   Functional Fitness:  -Group instruction provided by verbal instruction,  demonstration, patient participation, and written materials to support subject.  Instructors address safety measures for doing things around the house.  Discuss how to get up and down off the floor, how to pick things up properly, how to safely get out of a chair without assistance, and balance training.   Meditation and Mindfulness:  -Group instruction provided by verbal instruction, patient participation, and  written materials to support subject.  Instructor addresses importance of mindfulness and meditation practice to help reduce stress and improve awareness.  Instructor also leads participants through a meditation exercise.    Stretching for Flexibility and Mobility:  -Group instruction provided by verbal instruction, patient participation, and written materials to support subject.  Instructors lead participants through series of stretches that are designed to increase flexibility thus improving mobility.  These stretches are additional exercise for major muscle groups that are typically performed during regular warm up and cool down.   Hands Only CPR:  -Group verbal, video, and participation provides a basic overview of AHA guidelines for community CPR. Role-play of emergencies allow participants the opportunity to practice calling for help and chest compression technique with discussion of AED use.   Hypertension: -Group verbal and written instruction that provides a basic overview of hypertension including the most recent diagnostic guidelines, risk factor reduction with self-care instructions and medication management.    Nutrition I class: Heart Healthy Eating:  -Group instruction provided by PowerPoint slides, verbal discussion, and written materials to support subject matter. The instructor gives an explanation and review of the Therapeutic Lifestyle Changes diet recommendations, which includes a discussion on lipid goals, dietary fat, sodium, fiber, plant stanol/sterol esters,  sugar, and the components of a well-balanced, healthy diet.   Nutrition II class: Lifestyle Skills:  -Group instruction provided by PowerPoint slides, verbal discussion, and written materials to support subject matter. The instructor gives an explanation and review of label reading, grocery shopping for heart health, heart healthy recipe modifications, and ways to make healthier choices when eating out.   Diabetes Question & Answer:  -Group instruction provided by PowerPoint slides, verbal discussion, and written materials to support subject matter. The instructor gives an explanation and review of diabetes co-morbidities, pre- and post-prandial blood glucose goals, pre-exercise blood glucose goals, signs, symptoms, and treatment of hypoglycemia and hyperglycemia, and foot care basics.   Diabetes Blitz:  -Group instruction provided by PowerPoint slides, verbal discussion, and written materials to support subject matter. The instructor gives an explanation and review of the physiology behind type 1 and type 2 diabetes, diabetes medications and rational behind using different medications, pre- and post-prandial blood glucose recommendations and Hemoglobin A1c goals, diabetes diet, and exercise including blood glucose guidelines for exercising safely.    Portion Distortion:  -Group instruction provided by PowerPoint slides, verbal discussion, written materials, and food models to support subject matter. The instructor gives an explanation of serving size versus portion size, changes in portions sizes over the last 20 years, and what consists of a serving from each food group.   Stress Management:  -Group instruction provided by verbal instruction, video, and written materials to support subject matter.  Instructors review role of stress in heart disease and how to cope with stress positively.     Exercising on Your Own:  -Group instruction provided by verbal instruction, power point, and written  materials to support subject.  Instructors discuss benefits of exercise, components of exercise, frequency and intensity of exercise, and end points for exercise.  Also discuss use of nitroglycerin and activating EMS.  Review options of places to exercise outside of rehab.  Review guidelines for sex with heart disease.   Cardiac Drugs I:  -Group instruction provided by verbal instruction and written materials to support subject.  Instructor reviews cardiac drug classes: antiplatelets, anticoagulants, beta blockers, and statins.  Instructor discusses reasons, side effects, and lifestyle considerations for each drug class.  Cardiac Drugs II:  -Group instruction provided by verbal instruction and written materials to support subject.  Instructor reviews cardiac drug classes: angiotensin converting enzyme inhibitors (ACE-I), angiotensin II receptor blockers (ARBs), nitrates, and calcium channel blockers.  Instructor discusses reasons, side effects, and lifestyle considerations for each drug class.   Anatomy and Physiology of the Circulatory System:  Group verbal and written instruction and models provide basic cardiac anatomy and physiology, with the coronary electrical and arterial systems. Review of: AMI, Angina, Valve disease, Heart Failure, Peripheral Artery Disease, Cardiac Arrhythmia, Pacemakers, and the ICD.   Other Education:  -Group or individual verbal, written, or video instructions that support the educational goals of the cardiac rehab program.   Holiday Eating Survival Tips:  -Group instruction provided by PowerPoint slides, verbal discussion, and written materials to support subject matter. The instructor gives patients tips, tricks, and techniques to help them not only survive but enjoy the holidays despite the onslaught of food that accompanies the holidays.   Knowledge Questionnaire Score: Knowledge Questionnaire Score - 05/06/19 1039      Knowledge Questionnaire Score   Pre  Score  23/24       Core Components/Risk Factors/Patient Goals at Admission: Personal Goals and Risk Factors at Admission - 05/06/19 1139      Core Components/Risk Factors/Patient Goals on Admission    Weight Management  Yes;Weight Maintenance    Admit Weight  160 lb 4.4 oz (72.7 kg)    Hypertension  Yes    Intervention  Provide education on lifestyle modifcations including regular physical activity/exercise, weight management, moderate sodium restriction and increased consumption of fresh fruit, vegetables, and low fat dairy, alcohol moderation, and smoking cessation.;Monitor prescription use compliance.    Expected Outcomes  Short Term: Continued assessment and intervention until BP is < 140/53mm HG in hypertensive participants. < 130/70mm HG in hypertensive participants with diabetes, heart failure or chronic kidney disease.;Long Term: Maintenance of blood pressure at goal levels.    Lipids  Yes    Intervention  Provide education and support for participant on nutrition & aerobic/resistive exercise along with prescribed medications to achieve LDL 70mg , HDL >40mg .    Expected Outcomes  Short Term: Participant states understanding of desired cholesterol values and is compliant with medications prescribed. Participant is following exercise prescription and nutrition guidelines.;Long Term: Cholesterol controlled with medications as prescribed, with individualized exercise RX and with personalized nutrition plan. Value goals: LDL < 70mg , HDL > 40 mg.    Stress  Yes    Intervention  Offer individual and/or small group education and counseling on adjustment to heart disease, stress management and health-related lifestyle change. Teach and support self-help strategies.;Refer participants experiencing significant psychosocial distress to appropriate mental health specialists for further evaluation and treatment. When possible, include family members and significant others in education/counseling sessions.        Core Components/Risk Factors/Patient Goals Review:  Goals and Risk Factor Review    Row Name 05/10/19 1530 05/17/19 0923           Core Components/Risk Factors/Patient Goals Review   Personal Goals Review  Weight Management/Obesity;Stress;Hypertension;Lipids  Weight Management/Obesity;Stress;Hypertension;Lipids      Review  Pt with multiple CAD RFs willing to participate in CR exercise.  Adrian Pace would like to increase his walking distance.  Pt with multiple CAD RFs willing to participate in CR exercise.  Adrian Pace is tolerating his first weeks of exercise in cardiac rehab.      Expected Outcomes  Pt will continue to participate in  CR exercise, nutrition, and lifestyle modification opportunities.  Pt will continue to participate in CR exercise, nutrition, and lifestyle modification opportunities.         Core Components/Risk Factors/Patient Goals at Discharge (Final Review):  Goals and Risk Factor Review - 05/17/19 0923      Core Components/Risk Factors/Patient Goals Review   Personal Goals Review  Weight Management/Obesity;Stress;Hypertension;Lipids    Review  Pt with multiple CAD RFs willing to participate in CR exercise.  Adrian Pace is tolerating his first weeks of exercise in cardiac rehab.    Expected Outcomes  Pt will continue to participate in CR exercise, nutrition, and lifestyle modification opportunities.       ITP Comments: ITP Comments    Row Name 05/06/19 4259 05/10/19 1532 05/17/19 0922       ITP Comments  Dr. Fransico Him, Medical Director  Pt started exercise today and tolerated it well.  30 Day ITP Review.  Adrian Pace continues to tolerate exercise well during his first weeks of cardiac rehab.        Comments: See ITP Comments.

## 2019-05-21 ENCOUNTER — Encounter (HOSPITAL_COMMUNITY): Payer: Medicare Other

## 2019-05-24 ENCOUNTER — Encounter (HOSPITAL_COMMUNITY)
Admission: RE | Admit: 2019-05-24 | Discharge: 2019-05-24 | Disposition: A | Discharge status: Home / Self Care | Payer: Medicare Other | Source: Ambulatory Visit | Attending: Cardiology | Admitting: Cardiology

## 2019-05-24 ENCOUNTER — Other Ambulatory Visit: Payer: Self-pay

## 2019-05-24 DIAGNOSIS — I214 Non-ST elevation (NSTEMI) myocardial infarction: Secondary | ICD-10-CM | POA: Diagnosis not present

## 2019-05-24 DIAGNOSIS — Z955 Presence of coronary angioplasty implant and graft: Secondary | ICD-10-CM

## 2019-05-24 NOTE — Progress Notes (Signed)
Reviewed home exercise guidelines with patient including endpoints, temperature precautions, target heart rate and rate of perceived exertion. Pt plans to walk as his mode of home exercise. Pt voices understanding of instructions given.   Prentiss Polio M Johniya Durfee, MS, ACSM CEP  

## 2019-05-26 ENCOUNTER — Other Ambulatory Visit: Payer: Self-pay

## 2019-05-26 ENCOUNTER — Encounter (HOSPITAL_COMMUNITY)
Admission: RE | Admit: 2019-05-26 | Discharge: 2019-05-26 | Disposition: A | Discharge status: Home / Self Care | Payer: Medicare Other | Source: Ambulatory Visit | Attending: Cardiology | Admitting: Cardiology

## 2019-05-26 DIAGNOSIS — I214 Non-ST elevation (NSTEMI) myocardial infarction: Secondary | ICD-10-CM

## 2019-05-26 DIAGNOSIS — Z955 Presence of coronary angioplasty implant and graft: Secondary | ICD-10-CM

## 2019-05-28 ENCOUNTER — Encounter (HOSPITAL_COMMUNITY)
Admission: RE | Admit: 2019-05-28 | Discharge: 2019-05-28 | Disposition: A | Discharge status: Home / Self Care | Payer: Medicare Other | Source: Ambulatory Visit | Attending: Cardiology | Admitting: Cardiology

## 2019-05-28 ENCOUNTER — Other Ambulatory Visit: Payer: Self-pay

## 2019-05-28 DIAGNOSIS — Z955 Presence of coronary angioplasty implant and graft: Secondary | ICD-10-CM

## 2019-05-28 DIAGNOSIS — I214 Non-ST elevation (NSTEMI) myocardial infarction: Secondary | ICD-10-CM | POA: Diagnosis not present

## 2019-05-31 ENCOUNTER — Other Ambulatory Visit: Payer: Self-pay

## 2019-05-31 ENCOUNTER — Encounter (HOSPITAL_COMMUNITY)
Admission: RE | Admit: 2019-05-31 | Discharge: 2019-05-31 | Disposition: A | Discharge status: Home / Self Care | Payer: Medicare Other | Source: Ambulatory Visit | Attending: Cardiology | Admitting: Cardiology

## 2019-05-31 DIAGNOSIS — Z955 Presence of coronary angioplasty implant and graft: Secondary | ICD-10-CM | POA: Insufficient documentation

## 2019-05-31 DIAGNOSIS — I214 Non-ST elevation (NSTEMI) myocardial infarction: Secondary | ICD-10-CM | POA: Diagnosis present

## 2019-06-02 ENCOUNTER — Encounter (HOSPITAL_COMMUNITY)
Admission: RE | Admit: 2019-06-02 | Discharge: 2019-06-02 | Disposition: A | Discharge status: Home / Self Care | Payer: Medicare Other | Source: Ambulatory Visit | Attending: Cardiology | Admitting: Cardiology

## 2019-06-02 ENCOUNTER — Other Ambulatory Visit: Payer: Self-pay

## 2019-06-02 DIAGNOSIS — Z955 Presence of coronary angioplasty implant and graft: Secondary | ICD-10-CM

## 2019-06-02 DIAGNOSIS — I214 Non-ST elevation (NSTEMI) myocardial infarction: Secondary | ICD-10-CM

## 2019-06-04 ENCOUNTER — Encounter (HOSPITAL_COMMUNITY)
Admission: RE | Admit: 2019-06-04 | Discharge: 2019-06-04 | Disposition: A | Discharge status: Home / Self Care | Payer: Medicare Other | Source: Ambulatory Visit | Attending: Cardiology | Admitting: Cardiology

## 2019-06-04 ENCOUNTER — Other Ambulatory Visit: Payer: Self-pay

## 2019-06-04 DIAGNOSIS — I214 Non-ST elevation (NSTEMI) myocardial infarction: Secondary | ICD-10-CM

## 2019-06-04 DIAGNOSIS — Z955 Presence of coronary angioplasty implant and graft: Secondary | ICD-10-CM

## 2019-06-07 ENCOUNTER — Other Ambulatory Visit: Payer: Self-pay

## 2019-06-07 ENCOUNTER — Encounter (HOSPITAL_COMMUNITY)
Admission: RE | Admit: 2019-06-07 | Discharge: 2019-06-07 | Disposition: A | Discharge status: Home / Self Care | Payer: Medicare Other | Source: Ambulatory Visit | Attending: Cardiology | Admitting: Cardiology

## 2019-06-07 DIAGNOSIS — Z955 Presence of coronary angioplasty implant and graft: Secondary | ICD-10-CM

## 2019-06-07 DIAGNOSIS — I214 Non-ST elevation (NSTEMI) myocardial infarction: Secondary | ICD-10-CM

## 2019-06-08 ENCOUNTER — Ambulatory Visit (INDEPENDENT_AMBULATORY_CARE_PROVIDER_SITE_OTHER): Payer: Medicare Other | Admitting: *Deleted

## 2019-06-08 DIAGNOSIS — I472 Ventricular tachycardia, unspecified: Secondary | ICD-10-CM

## 2019-06-08 DIAGNOSIS — I5020 Unspecified systolic (congestive) heart failure: Secondary | ICD-10-CM

## 2019-06-08 LAB — CUP PACEART REMOTE DEVICE CHECK
Battery Voltage: 2.85 V
Brady Statistic RV Percent Paced: 0.04 %
Date Time Interrogation Session: 20200811084223
HighPow Impedance: 342 Ohm
HighPow Impedance: 70 Ohm
Implantable Lead Implant Date: 20130111
Implantable Lead Location: 753860
Implantable Lead Model: 6935
Implantable Pulse Generator Implant Date: 20130111
Lead Channel Impedance Value: 399 Ohm
Lead Channel Pacing Threshold Amplitude: 0.375 V
Lead Channel Pacing Threshold Pulse Width: 0.4 ms
Lead Channel Sensing Intrinsic Amplitude: 7.5 mV
Lead Channel Sensing Intrinsic Amplitude: 7.5 mV
Lead Channel Setting Pacing Amplitude: 2.5 V
Lead Channel Setting Pacing Pulse Width: 0.4 ms
Lead Channel Setting Sensing Sensitivity: 0.3 mV

## 2019-06-09 ENCOUNTER — Encounter (HOSPITAL_COMMUNITY)
Admission: RE | Admit: 2019-06-09 | Discharge: 2019-06-09 | Disposition: A | Discharge status: Home / Self Care | Payer: Medicare Other | Source: Ambulatory Visit | Attending: Cardiology | Admitting: Cardiology

## 2019-06-09 ENCOUNTER — Other Ambulatory Visit: Payer: Self-pay

## 2019-06-09 DIAGNOSIS — Z955 Presence of coronary angioplasty implant and graft: Secondary | ICD-10-CM

## 2019-06-09 DIAGNOSIS — I214 Non-ST elevation (NSTEMI) myocardial infarction: Secondary | ICD-10-CM | POA: Diagnosis not present

## 2019-06-11 ENCOUNTER — Other Ambulatory Visit: Payer: Self-pay

## 2019-06-11 ENCOUNTER — Ambulatory Visit: Payer: Medicare Other | Admitting: Cardiology

## 2019-06-11 ENCOUNTER — Encounter (HOSPITAL_COMMUNITY)
Admission: RE | Admit: 2019-06-11 | Discharge: 2019-06-11 | Disposition: A | Discharge status: Home / Self Care | Payer: Medicare Other | Source: Ambulatory Visit | Attending: Cardiology | Admitting: Cardiology

## 2019-06-11 DIAGNOSIS — I214 Non-ST elevation (NSTEMI) myocardial infarction: Secondary | ICD-10-CM | POA: Diagnosis not present

## 2019-06-11 DIAGNOSIS — Z955 Presence of coronary angioplasty implant and graft: Secondary | ICD-10-CM

## 2019-06-14 ENCOUNTER — Encounter (HOSPITAL_COMMUNITY)
Admission: RE | Admit: 2019-06-14 | Discharge: 2019-06-14 | Disposition: A | Discharge status: Home / Self Care | Payer: Medicare Other | Source: Ambulatory Visit | Attending: Cardiology | Admitting: Cardiology

## 2019-06-14 ENCOUNTER — Other Ambulatory Visit: Payer: Self-pay

## 2019-06-14 DIAGNOSIS — Z955 Presence of coronary angioplasty implant and graft: Secondary | ICD-10-CM

## 2019-06-14 DIAGNOSIS — I214 Non-ST elevation (NSTEMI) myocardial infarction: Secondary | ICD-10-CM

## 2019-06-16 ENCOUNTER — Encounter (HOSPITAL_COMMUNITY)
Admission: RE | Admit: 2019-06-16 | Discharge: 2019-06-16 | Disposition: A | Discharge status: Home / Self Care | Payer: Medicare Other | Source: Ambulatory Visit | Attending: Cardiology | Admitting: Cardiology

## 2019-06-16 ENCOUNTER — Other Ambulatory Visit: Payer: Self-pay

## 2019-06-16 DIAGNOSIS — I214 Non-ST elevation (NSTEMI) myocardial infarction: Secondary | ICD-10-CM | POA: Diagnosis not present

## 2019-06-16 DIAGNOSIS — Z955 Presence of coronary angioplasty implant and graft: Secondary | ICD-10-CM

## 2019-06-17 NOTE — Progress Notes (Signed)
Remote ICD transmission.   

## 2019-06-17 NOTE — Progress Notes (Signed)
Cardiac Individual Treatment Plan  Patient Details  Name: Adrian Pace MRN: [id number] Date of Birth: [birth year] Referring Provider:     CARDIAC REHAB PHASE II ORIENTATION from 05/06/2019 in [place]  Referring Provider  Dr. Stanford Breed      Initial Encounter Date:    CARDIAC REHAB PHASE II ORIENTATION from 05/06/2019 in [place]  Date  05/06/19      Visit Diagnosis: NSTEMI (non-ST elevated myocardial infarction) [facility])  Status post coronary artery stent placement  Patient's Home Medications on Admission:  Current Outpatient Medications:  .  aspirin 81 MG chewable tablet, Chew 1 tablet (81 mg total) by mouth daily for 30 days. (Patient taking differently: Chew 81 mg by mouth daily. ), Disp: 30 tablet, Rfl: 0 .  benazepril (LOTENSIN) 10 MG tablet, TAKE ONE TABLET BY MOUTH DAILY, Disp: 90 tablet, Rfl: 3 .  carvedilol (COREG) 12.5 MG tablet, Take 1 tablet (12.5 mg total) by mouth 2 (two) times daily with a meal for 30 days., Disp: 60 tablet, Rfl: 0 .  LORazepam (ATIVAN) 0.5 MG tablet, Take 0.5-1 mg by mouth See admin instructions. Take 1-2 tablets as needed for Anxiety, Disp: , Rfl:  .  simvastatin (ZOCOR) 40 MG tablet, TAKE ONE TABLET BY MOUTH DAILY AT 6PM, Disp: 90 tablet, Rfl: 0 .  ticagrelor (BRILINTA) 90 MG TABS tablet, Take 1 tablet (90 mg total) by mouth 2 (two) times daily., Disp: 180 tablet, Rfl: 3 .  zolpidem (AMBIEN) 10 MG tablet, Take 10 mg by mouth at bedtime as needed for sleep. , Disp: , Rfl:   Past Medical History: Past Medical History:  Diagnosis Date  . Anxiety    Takes Ativan as needed  . Cardiac dysrhythmia, unspecified   . CHF (congestive heart failure) ([place])   . Coronary artery disease    Hx of cardiac caths   . Enlarged prostate   . GERD (gastroesophageal reflux disease)    Takes prilosec  . Gout   . Heart attack ([place])   . HTN (hypertension)   . Hyperkalemia   . Hyperlipemia   .  Ischemic cardiomyopathy   . LV dysfunction   . Myocardial infarct ([place]) 1988  . Panic attacks   . Personal history of peptic ulcer disease   . Renal insufficiency   . Syncope and collapse   . Ventricular tachycardia ([facility])     Tobacco Use: Social History   Tobacco Use  Smoking Status Never Smoker  Smokeless Tobacco Never Used    Labs: Recent Review Flowsheet Data    Labs for ITP Cardiac and Pulmonary Rehab Latest Ref Rng & Units 08/26/2007 05/08/2010 01/16/2011 07/09/2011 02/21/2019   Cholestrol 0 - 200 mg/dL - 118 118 102 95   LDLCALC 0 - 99 mg/dL - 70 69 56 50   HDL >40 mg/dL - 34(A) 31(L) 31.10(L) 28(L)   Trlycerides <150 mg/dL - 78 88 76.0 83   Hemoglobin A1c 4.8 - 5.6 % - - - - 5.8(H)   HCO3 - 23.5 - - - -   TCO2 - 25 - - - -   ACIDBASEDEF - 2.0 - - - -      Capillary Blood Glucose: No results found for: GLUCAP   Exercise Target Goals: Exercise Program Goal: Individual exercise prescription set using results from initial 6 min walk test and THRR while considering  patient's activity barriers and safety.   Exercise Prescription Goal: Initial exercise prescription builds to  30-45 minutes a day of aerobic activity, 2-3 days per week.  Home exercise guidelines will be given to patient during program as part of exercise prescription that the participant will acknowledge.  Activity Barriers & Risk Stratification: Activity Barriers & Cardiac Risk Stratification - 05/06/19 1137      Activity Barriers & Cardiac Risk Stratification   Activity Barriers  None    Cardiac Risk Stratification  High       6 Minute Walk: 6 Minute Walk    Row Name 05/06/19 1136 06/16/19 1513       6 Minute Walk   Phase  Initial  Discharge    Distance  1152 feet  1722 feet    Distance % Change  -  49.48 %    Walk Time  6 minutes  6 minutes    # of Rest Breaks  0  0    MPH  2.18  3.26    METS  1.78  3.07    RPE  10  11    Perceived Dyspnea   0  0    VO2 Peak  6.24  10.74    Symptoms   No  No    Resting HR  78 bpm  75 bpm    Resting BP  122/64  118/64    Resting Oxygen Saturation   96 %  -    Exercise Oxygen Saturation  during 6 min walk  98 %  -    Max Ex. HR  86 bpm  114 bpm    Max Ex. BP  116/64  128/72    2 Minute Post BP  114/70  106/58       Oxygen Initial Assessment:   Oxygen Re-Evaluation:   Oxygen Discharge (Final Oxygen Re-Evaluation):   Initial Exercise Prescription: Initial Exercise Prescription - 05/06/19 1100      Date of Initial Exercise RX and Referring Provider   Date  05/06/19    Referring Provider  Dr. Stanford Breed    Expected Discharge Date  06/18/19      Treadmill   MPH  1.2    Grade  0    Minutes  15      NuStep   Level  2    SPM  75    Minutes  15    METs  1.8      Prescription Details   Frequency (times per week)  3    Duration  Progress to 30 minutes of continuous aerobic without signs/symptoms of physical distress      Intensity   THRR 40-80% of Max Heartrate  54-109    Ratings of Perceived Exertion  11-13      Progression   Progression  Continue to progress workloads to maintain intensity without signs/symptoms of physical distress.      Resistance Training   Training Prescription  Yes    Weight  3 lbs.     Reps  10-15       Perform Capillary Blood Glucose checks as needed.  Exercise Prescription Changes: Exercise Prescription Changes    Row Name 05/10/19 1600 05/19/19 1600 05/24/19 1524 06/16/19 1523       Response to Exercise   Blood Pressure (Admit)  118/70  104/60  124/70  118/64    Blood Pressure (Exercise)  124/74  122/68  128/70  128/72    Blood Pressure (Exit)  104/62  110/62  112/60  106/58    Heart Rate (Admit)  81 bpm  86 bpm  76 bpm  75 bpm    Heart Rate (Exercise)  104 bpm  104 bpm  101 bpm  114 bpm    Heart Rate (Exit)  88 bpm  82 bpm  82 bpm  85 bpm    Rating of Perceived Exertion (Exercise)  12  12  12  12     Symptoms  none  none  none  none    Comments  Pt first day of exercise  -  -  -     Duration  Continue with 30 min of aerobic exercise without signs/symptoms of physical distress.  Continue with 30 min of aerobic exercise without signs/symptoms of physical distress.  Continue with 30 min of aerobic exercise without signs/symptoms of physical distress.  Continue with 30 min of aerobic exercise without signs/symptoms of physical distress.    Intensity  THRR unchanged  THRR unchanged  THRR unchanged  THRR unchanged      Progression   Progression  Continue to progress workloads to maintain intensity without signs/symptoms of physical distress.  Continue to progress workloads to maintain intensity without signs/symptoms of physical distress.  Continue to progress workloads to maintain intensity without signs/symptoms of physical distress.  Continue to progress workloads to maintain intensity without signs/symptoms of physical distress.    Average METs  2.2  2.6  2.7  3.4      Resistance Training   Training Prescription  Yes  No  Yes  No Relaxation day, no weights.    Weight  3 lbs.   -  3lbs  -    Reps  10-15  -  10-15  -    Time  10 Minutes  -  10 Minutes  -      Interval Training   Interval Training  -  -  No  No      Treadmill   MPH  2  2.2  2.2  2.6    Grade  0  2  2  2     Minutes  15  15  15  15     METs  -  -  3.29  3.71      NuStep   Level  2  3  3  4     SPM  75  85  85  85    Minutes  15  15  15  15     METs  1.9  2.1  2.1  3.1      Track   Laps  -  -  -  8.5 1722 feet    Minutes  -  -  -  6 Walk test    METs  -  -  -  3.5      Home Exercise Plan   Plans to continue exercise at  -  Home (comment)  Home (comment) Walking  Home (comment) Walking    Frequency  -  Add 2 additional days to program exercise sessions.  Add 2 additional days to program exercise sessions.  Add 2 additional days to program exercise sessions.    Initial Home Exercises Provided  -  05/19/19  05/24/19  05/24/19       Exercise Comments: Exercise Comments    Row Name 05/10/19 1613  05/19/19 1613 05/24/19 1527 06/16/19 1525     Exercise Comments  Pt first day of exercise. Tolerated exercise well.  Reviewed goals and home exercise with Pt. Pt expressed understanding.  Reviewed home exercise guidelines with  patient.  Reviewed goals with patient.       Exercise Goals and Review: Exercise Goals    Row Name 05/06/19 1138             Exercise Goals   Increase Physical Activity  Yes       Intervention  Provide advice, education, support and counseling about physical activity/exercise needs.;Develop an individualized exercise prescription for aerobic and resistive training based on initial evaluation findings, risk stratification, comorbidities and participant's personal goals.       Expected Outcomes  Short Term: Attend rehab on a regular basis to increase amount of physical activity.;Long Term: Add in home exercise to make exercise part of routine and to increase amount of physical activity.;Long Term: Exercising regularly at least 3-5 days a week.       Increase Strength and Stamina  Yes       Intervention  Provide advice, education, support and counseling about physical activity/exercise needs.;Develop an individualized exercise prescription for aerobic and resistive training based on initial evaluation findings, risk stratification, comorbidities and participant's personal goals.       Expected Outcomes  Short Term: Increase workloads from initial exercise prescription for resistance, speed, and METs.;Short Term: Perform resistance training exercises routinely during rehab and add in resistance training at home;Long Term: Improve cardiorespiratory fitness, muscular endurance and strength as measured by increased METs and functional capacity (6MWT)       Able to understand and use rate of perceived exertion (RPE) scale  Yes       Intervention  Provide education and explanation on how to use RPE scale       Expected Outcomes  Short Term: Able to use RPE daily in rehab to  express subjective intensity level;Long Term:  Able to use RPE to guide intensity level when exercising independently       Knowledge and understanding of Target Heart Rate Range (THRR)  Yes       Intervention  Provide education and explanation of THRR including how the numbers were predicted and where they are located for reference       Expected Outcomes  Short Term: Able to state/look up THRR;Long Term: Able to use THRR to govern intensity when exercising independently;Short Term: Able to use daily as guideline for intensity in rehab       Able to check pulse independently  Yes       Intervention  Provide education and demonstration on how to check pulse in carotid and radial arteries.;Review the importance of being able to check your own pulse for safety during independent exercise       Expected Outcomes  Short Term: Able to explain why pulse checking is important during independent exercise;Long Term: Able to check pulse independently and accurately       Understanding of Exercise Prescription  Yes       Intervention  Provide education, explanation, and written materials on patient's individual exercise prescription       Expected Outcomes  Short Term: Able to explain program exercise prescription;Long Term: Able to explain home exercise prescription to exercise independently          Exercise Goals Re-Evaluation : Exercise Goals Re-Evaluation    Row Name 05/10/19 1611 05/19/19 1611 05/24/19 1527 06/16/19 1525       Exercise Goal Re-Evaluation   Exercise Goals Review  Increase Physical Activity;Increase Strength and Stamina;Able to understand and use rate of perceived exertion (RPE) scale;Knowledge and understanding of Target Heart Rate  Range (THRR);Understanding of Exercise Prescription  Increase Physical Activity;Increase Strength and Stamina;Able to understand and use rate of perceived exertion (RPE) scale;Knowledge and understanding of Target Heart Rate Range (THRR);Able to check pulse  independently;Understanding of Exercise Prescription  Increase Physical Activity;Increase Strength and Stamina;Able to understand and use rate of perceived exertion (RPE) scale;Knowledge and understanding of Target Heart Rate Range (THRR);Able to check pulse independently;Understanding of Exercise Prescription  Increase Physical Activity;Increase Strength and Stamina;Able to understand and use rate of perceived exertion (RPE) scale;Knowledge and understanding of Target Heart Rate Range (THRR);Able to check pulse independently;Understanding of Exercise Prescription    Comments  Pt first day of exercise.Pt tolerated exercise well. Pt understands RPE scale and THRR.  Reviewed goals and home exercise with Pt. Pt stated he is not exercising on days he does not come to CR. Encouraged Pt to walk 2 days per week for 30 minuteds in addition to CR.  Reviewed home exercise guidelines with patient, and pt plans to walk, weather permitting, as his mode of home exercise.  Patient plans to continue walking upon completion of the cardiac rehab program. Pt is currently walking 12-16 minutes, 3 days/week. Pt's goal is to walk at least 20 minutes, 5 days/week as his mode of exercise post cardiac rehab. Pt's functional capacity increased 49% as measured by 6MWT, strength increased 10% as measured by grip strength test, and flexibility increased 12% as measured by sit and reach test.    Expected Outcomes  Will continue to monitor and progress Pt as tolerated.  Will continue to monitor and progress Pt as tolerated.  Increase workloads as tolerated to help increase cardiorespiratory fitness.  Patient will walk at least 20 minutes, 5 days/week to maintain health and fitness gains.       Discharge Exercise Prescription (Final Exercise Prescription Changes): Exercise Prescription Changes - 06/16/19 1523      Response to Exercise   Blood Pressure (Admit)  118/64    Blood Pressure (Exercise)  128/72    Blood Pressure (Exit)  106/58     Heart Rate (Admit)  75 bpm    Heart Rate (Exercise)  114 bpm    Heart Rate (Exit)  85 bpm    Rating of Perceived Exertion (Exercise)  12    Symptoms  none    Duration  Continue with 30 min of aerobic exercise without signs/symptoms of physical distress.    Intensity  THRR unchanged      Progression   Progression  Continue to progress workloads to maintain intensity without signs/symptoms of physical distress.    Average METs  3.4      Resistance Training   Training Prescription  No   Relaxation day, no weights.     Interval Training   Interval Training  No      Treadmill   MPH  2.6    Grade  2    Minutes  15    METs  3.71      NuStep   Level  4    SPM  85    Minutes  15    METs  3.1      Track   Laps  8.5   1722 feet   Minutes  6   Walk test   METs  3.5      Home Exercise Plan   Plans to continue exercise at  Home (comment)   Walking   Frequency  Add 2 additional days to program exercise sessions.    Initial  Home Exercises Provided  05/24/19       Nutrition:  Target Goals: Understanding of nutrition guidelines, daily intake of sodium 1500mg , cholesterol 200mg , calories 30% from fat and 7% or less from saturated fats, daily to have 5 or more servings of fruits and vegetables.  Biometrics: Pre Biometrics - 05/06/19 1138      Pre Biometrics   Height  5\' 7"  (1.702 m)    Weight  72.7 kg    Waist Circumference  39.5 inches    Hip Circumference  38 inches    Waist to Hip Ratio  1.04 %    BMI (Calculated)  25.1    Triceps Skinfold  21 mm    % Body Fat  28.4 %    Grip Strength  36 kg    Flexibility  8 in    Single Leg Stand  30 seconds      Post Biometrics - 06/16/19 1520       Post  Biometrics   Waist Circumference  39.75 inches    Hip Circumference  37.5 inches    Waist to Hip Ratio  1.06 %    Triceps Skinfold  12 mm    Grip Strength  39.5 kg    Flexibility  9 in    Single Leg Stand  21.12 seconds       Nutrition Therapy Plan and Nutrition  Goals:   Nutrition Assessments:   Nutrition Goals Re-Evaluation:   Nutrition Goals Re-Evaluation:   Nutrition Goals Discharge (Final Nutrition Goals Re-Evaluation):   Psychosocial: Target Goals: Acknowledge presence or absence of significant depression and/or stress, maximize coping skills, provide positive support system. Participant is able to verbalize types and ability to use techniques and skills needed for reducing stress and depression.  Initial Review & Psychosocial Screening: Initial Psych Review & Screening - 05/06/19 1105      Initial Review   Current issues with  None Identified      Family Dynamics   Good Support System?  Yes      Barriers   Psychosocial barriers to participate in program  There are no identifiable barriers or psychosocial needs.       Quality of Life Scores: Quality of Life - 05/06/19 1039      Quality of Life   Select  Quality of Life      Quality of Life Scores   Health/Function Pre  25 %    Socioeconomic Pre  27.36 %    Psych/Spiritual Pre  27.57 %    Family Pre  26.4 %    GLOBAL Pre  26.22 %      Scores of 19 and below usually indicate a poorer quality of life in these areas.  A difference of  2-3 points is a clinically meaningful difference.  A difference of 2-3 points in the total score of the Quality of Life Index has been associated with significant improvement in overall quality of life, self-image, physical symptoms, and general health in studies assessing change in quality of life.  PHQ-9: Recent Review Flowsheet Data    Depression screen [facility] 2/9 05/10/2019   Decreased Interest 0   Down, Depressed, Hopeless 0   PHQ - 2 Score 0     Interpretation of Total Score  Total Score Depression Severity:  1-4 = Minimal depression, 5-9 = Mild depression, 10-14 = Moderate depression, 15-19 = Moderately severe depression, 20-27 = Severe depression   Psychosocial Evaluation and Intervention: Psychosocial Evaluation - 05/10/19 1529  Psychosocial Evaluation & Interventions   Interventions  Encouraged to exercise with the program and follow exercise prescription    Comments  No psychosocial needs identified. Adrian Pace enjoys reading and fishing.    Expected Outcomes  Adrian Pace will maintain a positive outlook with good coping skills.    Continue Psychosocial Services   No Follow up required       Psychosocial Re-Evaluation: Psychosocial Re-Evaluation    [place] Name 05/17/19 1610 06/17/19 1620           Psychosocial Re-Evaluation   Current issues with  None Identified  None Identified      Comments  No psychosocial needs identified.  No psychosocial needs identified.      Expected Outcomes  Adrian Pace will maintain a positive outlook with good coping skills.  Adrian Pace will maintain a positive outlook with good coping skills.      Interventions  Encouraged to attend Cardiac Rehabilitation for the exercise  Encouraged to attend Cardiac Rehabilitation for the exercise      Continue Psychosocial Services   No Follow up required  No Follow up required         Psychosocial Discharge (Final Psychosocial Re-Evaluation): Psychosocial Re-Evaluation - 06/17/19 1620      Psychosocial Re-Evaluation   Current issues with  None Identified    Comments  No psychosocial needs identified.    Expected Outcomes  Adrian Pace will maintain a positive outlook with good coping skills.    Interventions  Encouraged to attend Cardiac Rehabilitation for the exercise    Continue Psychosocial Services   No Follow up required       Vocational Rehabilitation: Provide vocational rehab assistance to qualifying candidates.   Vocational Rehab Evaluation & Intervention: Vocational Rehab - 05/06/19 1211      Initial Vocational Rehab Evaluation & Intervention   Assessment shows need for Vocational Rehabilitation  No       Education: Education Goals: Education classes will be provided on a weekly basis, covering required topics. Participant will  state understanding/return demonstration of topics presented.  Learning Barriers/Preferences: Learning Barriers/Preferences - 05/06/19 1139      Learning Barriers/Preferences   Learning Barriers  None    Learning Preferences  Audio;Group Instruction;Individual Instruction;Pictoral;Skilled Demonstration;Verbal Instruction;Written Material;Video       Education Topics: Count Your Pulse:  -Group instruction provided by verbal instruction, demonstration, patient participation and written materials to support subject.  Instructors address importance of being able to find your pulse and how to count your pulse when at home without a heart monitor.  Patients get hands on experience counting their pulse with staff help and individually.   Heart Attack, Angina, and Risk Factor Modification:  -Group instruction provided by verbal instruction, video, and written materials to support subject.  Instructors address signs and symptoms of angina and heart attacks.    Also discuss risk factors for heart disease and how to make changes to improve heart health risk factors.   Functional Fitness:  -Group instruction provided by verbal instruction, demonstration, patient participation, and written materials to support subject.  Instructors address safety measures for doing things around the house.  Discuss how to get up and down off the floor, how to pick things up properly, how to safely get out of a chair without assistance, and balance training.   Meditation and Mindfulness:  -Group instruction provided by verbal instruction, patient participation, and written materials to support subject.  Instructor addresses importance of mindfulness and meditation practice to help reduce stress and improve  awareness.  Instructor also leads participants through a meditation exercise.    Stretching for Flexibility and Mobility:  -Group instruction provided by verbal instruction, patient participation, and written  materials to support subject.  Instructors lead participants through series of stretches that are designed to increase flexibility thus improving mobility.  These stretches are additional exercise for major muscle groups that are typically performed during regular warm up and cool down.   Hands Only CPR:  -Group verbal, video, and participation provides a basic overview of AHA guidelines for community CPR. Role-play of emergencies allow participants the opportunity to practice calling for help and chest compression technique with discussion of AED use.   Hypertension: -Group verbal and written instruction that provides a basic overview of hypertension including the most recent diagnostic guidelines, risk factor reduction with self-care instructions and medication management.    Nutrition I class: Heart Healthy Eating:  -Group instruction provided by PowerPoint slides, verbal discussion, and written materials to support subject matter. The instructor gives an explanation and review of the Therapeutic Lifestyle Changes diet recommendations, which includes a discussion on lipid goals, dietary fat, sodium, fiber, plant stanol/sterol esters, sugar, and the components of a well-balanced, healthy diet.   Nutrition II class: Lifestyle Skills:  -Group instruction provided by PowerPoint slides, verbal discussion, and written materials to support subject matter. The instructor gives an explanation and review of label reading, grocery shopping for heart health, heart healthy recipe modifications, and ways to make healthier choices when eating out.   Diabetes Question & Answer:  -Group instruction provided by PowerPoint slides, verbal discussion, and written materials to support subject matter. The instructor gives an explanation and review of diabetes co-morbidities, pre- and post-prandial blood glucose goals, pre-exercise blood glucose goals, signs, symptoms, and treatment of hypoglycemia and  hyperglycemia, and foot care basics.   Diabetes Blitz:  -Group instruction provided by PowerPoint slides, verbal discussion, and written materials to support subject matter. The instructor gives an explanation and review of the physiology behind type 1 and type 2 diabetes, diabetes medications and rational behind using different medications, pre- and post-prandial blood glucose recommendations and Hemoglobin A1c goals, diabetes diet, and exercise including blood glucose guidelines for exercising safely.    Portion Distortion:  -Group instruction provided by PowerPoint slides, verbal discussion, written materials, and food models to support subject matter. The instructor gives an explanation of serving size versus portion size, changes in portions sizes over the last 20 years, and what consists of a serving from each food group.   Stress Management:  -Group instruction provided by verbal instruction, video, and written materials to support subject matter.  Instructors review role of stress in heart disease and how to cope with stress positively.     Exercising on Your Own:  -Group instruction provided by verbal instruction, power point, and written materials to support subject.  Instructors discuss benefits of exercise, components of exercise, frequency and intensity of exercise, and end points for exercise.  Also discuss use of nitroglycerin and activating EMS.  Review options of places to exercise outside of rehab.  Review guidelines for sex with heart disease.   Cardiac Drugs I:  -Group instruction provided by verbal instruction and written materials to support subject.  Instructor reviews cardiac drug classes: antiplatelets, anticoagulants, beta blockers, and statins.  Instructor discusses reasons, side effects, and lifestyle considerations for each drug class.   Cardiac Drugs II:  -Group instruction provided by verbal instruction and written materials to support subject.  Instructor  reviews  cardiac drug classes: angiotensin converting enzyme inhibitors (ACE-I), angiotensin II receptor blockers (ARBs), nitrates, and calcium channel blockers.  Instructor discusses reasons, side effects, and lifestyle considerations for each drug class.   Anatomy and Physiology of the Circulatory System:  Group verbal and written instruction and models provide basic cardiac anatomy and physiology, with the coronary electrical and arterial systems. Review of: AMI, Angina, Valve disease, Heart Failure, Peripheral Artery Disease, Cardiac Arrhythmia, Pacemakers, and the ICD.   Other Education:  -Group or individual verbal, written, or video instructions that support the educational goals of the cardiac rehab program.   Holiday Eating Survival Tips:  -Group instruction provided by PowerPoint slides, verbal discussion, and written materials to support subject matter. The instructor gives patients tips, tricks, and techniques to help them not only survive but enjoy the holidays despite the onslaught of food that accompanies the holidays.   Knowledge Questionnaire Score: Knowledge Questionnaire Score - 05/06/19 1039      Knowledge Questionnaire Score   Pre Score  23/24       Core Components/Risk Factors/Patient Goals at Admission: Personal Goals and Risk Factors at Admission - 05/06/19 1139      Core Components/Risk Factors/Patient Goals on Admission    Weight Management  Yes;Weight Maintenance    Admit Weight  160 lb 4.4 oz (72.7 kg)    Hypertension  Yes    Intervention  Provide education on lifestyle modifcations including regular physical activity/exercise, weight management, moderate sodium restriction and increased consumption of fresh fruit, vegetables, and low fat dairy, alcohol moderation, and smoking cessation.;Monitor prescription use compliance.    Expected Outcomes  Short Term: Continued assessment and intervention until BP is < 140/89mm HG in hypertensive participants. <  130/58mm HG in hypertensive participants with diabetes, heart failure or chronic kidney disease.;Long Term: Maintenance of blood pressure at goal levels.    Lipids  Yes    Intervention  Provide education and support for participant on nutrition & aerobic/resistive exercise along with prescribed medications to achieve LDL 70mg , HDL >40mg .    Expected Outcomes  Short Term: Participant states understanding of desired cholesterol values and is compliant with medications prescribed. Participant is following exercise prescription and nutrition guidelines.;Long Term: Cholesterol controlled with medications as prescribed, with individualized exercise RX and with personalized nutrition plan. Value goals: LDL < 70mg , HDL > 40 mg.    Stress  Yes    Intervention  Offer individual and/or small group education and counseling on adjustment to heart disease, stress management and health-related lifestyle change. Teach and support self-help strategies.;Refer participants experiencing significant psychosocial distress to appropriate mental health specialists for further evaluation and treatment. When possible, include family members and significant others in education/counseling sessions.       Core Components/Risk Factors/Patient Goals Review:  Goals and Risk Factor Review    Row Name 05/10/19 1530 05/17/19 0923 06/17/19 1620         Core Components/Risk Factors/Patient Goals Review   Personal Goals Review  Weight Management/Obesity;Stress;Hypertension;Lipids  Weight Management/Obesity;Stress;Hypertension;Lipids  Weight Management/Obesity;Stress;Hypertension;Lipids     Review  Pt with multiple CAD RFs willing to participate in CR exercise.  Adrian Pace would like to increase his walking distance.  Pt with multiple CAD RFs willing to participate in CR exercise.  Adrian Pace is tolerating his first weeks of exercise in cardiac rehab.  Pt with multiple CAD RFs willing to participate in CR exercise.  Adrian Pace continues to  tolerate exercise well.  He will graduate on 06/18/2019.  He feels that it was  helpful for him and his health to come to CR. He was able to increase his walking distance, a goal of his.     Expected Outcomes  Pt will continue to participate in CR exercise, nutrition, and lifestyle modification opportunities.  Pt will continue to participate in CR exercise, nutrition, and lifestyle modification opportunities.  Pt will continue to participate in CR exercise, nutrition, and lifestyle modification opportunities.        Core Components/Risk Factors/Patient Goals at Discharge (Final Review):  Goals and Risk Factor Review - 06/17/19 1620      Core Components/Risk Factors/Patient Goals Review   Personal Goals Review  Weight Management/Obesity;Stress;Hypertension;Lipids    Review  Pt with multiple CAD RFs willing to participate in CR exercise.  Adrian Pace continues to tolerate exercise well.  He will graduate on 06/18/2019.  He feels that it was helpful for him and his health to come to CR. He was able to increase his walking distance, a goal of his.    Expected Outcomes  Pt will continue to participate in CR exercise, nutrition, and lifestyle modification opportunities.       ITP Comments: ITP Comments    Row Name 05/06/19 0349 05/10/19 1532 05/17/19 0922 06/17/19 1344     ITP Comments  Dr. Fransico Him, Medical Director  Pt started exercise today and tolerated it well.  30 Day ITP Review.  Adrian Pace continues to tolerate exercise well during his first weeks of cardiac rehab.  30 Day ITP Review.  Adrian Pace continues to tolerate exercise well.  He will graduate on 06/18/2019.  He feels that it was helpful for him and his health to come to CR. He was able to increase his walking distance, a goal of his.       Comments: See ITP Comments.

## 2019-06-18 ENCOUNTER — Other Ambulatory Visit: Payer: Self-pay

## 2019-06-18 ENCOUNTER — Encounter (HOSPITAL_COMMUNITY)
Admission: RE | Admit: 2019-06-18 | Discharge: 2019-06-18 | Disposition: A | Discharge status: Home / Self Care | Payer: Medicare Other | Source: Ambulatory Visit | Attending: Cardiology | Admitting: Cardiology

## 2019-06-18 VITALS — BP 104/60 | HR 72 | Ht 67.0 in | Wt 164.5 lb

## 2019-06-18 DIAGNOSIS — I214 Non-ST elevation (NSTEMI) myocardial infarction: Secondary | ICD-10-CM | POA: Diagnosis not present

## 2019-06-18 DIAGNOSIS — Z955 Presence of coronary angioplasty implant and graft: Secondary | ICD-10-CM

## 2019-06-21 NOTE — Progress Notes (Signed)
Discharge Progress Report  Patient Details  Name: Adrian Pace MRN: [id number] Date of Birth: 1933/04/16 Referring Provider:     [place] from 05/06/2019 in [place]  Referring Provider  Dr. Stanford Breed       Number of Visits: 17  Reason for Discharge:  Patient reached a stable level of exercise. Patient independent in their exercise. Patient has met program and personal goals.  Smoking History:  Social History   Tobacco Use  Smoking Status Never Smoker  Smokeless Tobacco Never Used    Diagnosis:  NSTEMI (non-ST elevated myocardial infarction) ([place])  Status post coronary artery stent placement  ADL [facility]:   Initial Exercise Prescription: Initial Exercise Prescription - 05/06/19 1100      Date of Initial Exercise RX and Referring Provider   Date  05/06/19    Referring Provider  Dr. Stanford Breed    Expected Discharge Date  06/18/19      Treadmill   MPH  1.2    Grade  0    Minutes  15      NuStep   Level  2    SPM  75    Minutes  15    METs  1.8      Prescription Details   Frequency (times per week)  3    Duration  Progress to 30 minutes of continuous aerobic without signs/symptoms of physical distress      Intensity   THRR 40-80% of Max Heartrate  54-109    Ratings of Perceived Exertion  11-13      Progression   Progression  Continue to progress workloads to maintain intensity without signs/symptoms of physical distress.      Resistance Training   Training Prescription  Yes    Weight  3 lbs.     Reps  10-15       Discharge Exercise Prescription (Final Exercise Prescription Changes): Exercise Prescription Changes - 06/18/19 1517      Response to Exercise   Blood Pressure (Admit)  104/60    Blood Pressure (Exercise)  120/70    Blood Pressure (Exit)  100/62    Heart Rate (Admit)  72 bpm    Heart Rate (Exercise)  97 bpm    Heart Rate (Exit)  82 bpm    Rating of Perceived Exertion  (Exercise)  11    Symptoms  none    Duration  Continue with 30 min of aerobic exercise without signs/symptoms of physical distress.    Intensity  THRR unchanged      Progression   Progression  Continue to progress workloads to maintain intensity without signs/symptoms of physical distress.    Average METs  3.2      Resistance Training   Training Prescription  Yes    Weight  3lbs    Reps  10-15    Time  10 Minutes      Interval Training   Interval Training  No      Treadmill   MPH  2.6    Grade  2    Minutes  15    METs  3.71      NuStep   Level  4    SPM  85    Minutes  15    METs  2.6      Track   Laps  --    Minutes  --    METs  --      Home Exercise  Plan   Plans to continue exercise at  Home (comment)   Walking   Frequency  Add 2 additional days to program exercise sessions.    Initial Home Exercises Provided  05/24/19       Functional Capacity: 6 Minute Walk    Row Name 05/06/19 1136 06/16/19 1513       6 Minute Walk   Phase  Initial  Discharge    Distance  1152 feet  1722 feet    Distance % Change  -  49.48 %    Walk Time  6 minutes  6 minutes    # of Rest Breaks  0  0    MPH  2.18  3.26    METS  1.78  3.07    RPE  10  11    Perceived Dyspnea   0  0    VO2 Peak  6.24  10.74    Symptoms  No  No    Resting HR  78 bpm  75 bpm    Resting BP  122/64  118/64    Resting Oxygen Saturation   96 %  -    Exercise Oxygen Saturation  during 6 min walk  98 %  -    Max Ex. HR  86 bpm  114 bpm    Max Ex. BP  116/64  128/72    2 Minute Post BP  114/70  106/58       Psychological, QOL, Others - Outcomes: PHQ 2/9: Depression screen [place] 2/9 06/21/2019 05/10/2019  Decreased Interest 0 0  Down, Depressed, Hopeless 0 0  PHQ - 2 Score 0 0    Quality of Life: Quality of Life - 06/18/19 1603      Quality of Life   Select  Quality of Life      Quality of Life Scores   Health/Function Pre  25 %    Health/Function Post  26.03 %    Health/Function % Change   4.12 %    Socioeconomic Pre  27.36 %    Socioeconomic Post  26.43 %    Socioeconomic % Change   -3.4 %    Psych/Spiritual Pre  27.57 %    Psych/Spiritual Post  28.21 %    Psych/Spiritual % Change  2.32 %    Family Pre  26.4 %    Family Post  29.5 %    Family % Change  11.74 %    GLOBAL Pre  26.22 %    GLOBAL Post  27.07 %    GLOBAL % Change  3.24 %       Personal Goals: Goals established at orientation with interventions provided to work toward goal. Personal Goals and Risk Factors at Admission - 05/06/19 1139      Core Components/Risk Factors/Patient Goals on Admission    Weight Management  Yes;Weight Maintenance    Admit Weight  160 lb 4.4 oz (72.7 kg)    Hypertension  Yes    Intervention  Provide education on lifestyle modifcations including regular physical activity/exercise, weight management, moderate sodium restriction and increased consumption of fresh fruit, vegetables, and low fat dairy, alcohol moderation, and smoking cessation.;Monitor prescription use compliance.    Expected Outcomes  Short Term: Continued assessment and intervention until BP is < 140/54m HG in hypertensive participants. < 130/843mHG in hypertensive participants with diabetes, heart failure or chronic kidney disease.;Long Term: Maintenance of blood pressure at goal levels.    Lipids  Yes  Intervention  Provide education and support for participant on nutrition & aerobic/resistive exercise along with prescribed medications to achieve LDL <4m, HDL >472m    Expected Outcomes  Short Term: Participant states understanding of desired cholesterol values and is compliant with medications prescribed. Participant is following exercise prescription and nutrition guidelines.;Long Term: Cholesterol controlled with medications as prescribed, with individualized exercise RX and with personalized nutrition plan. Value goals: LDL < 7079mHDL > 40 mg.    Stress  Yes    Intervention  Offer individual and/or small group  education and counseling on adjustment to heart disease, stress management and health-related lifestyle change. Teach and support self-help strategies.;Refer participants experiencing significant psychosocial distress to appropriate mental health specialists for further evaluation and treatment. When possible, include family members and significant others in education/counseling sessions.        Personal Goals Discharge: Goals and Risk Factor Review    Row Name 05/10/19 1530 05/17/19 0925456/20/20 1620 06/21/19 1647       Core Components/Risk Factors/Patient Goals Review   Personal Goals Review  Weight Management/Obesity;Stress;Hypertension;Lipids  Weight Management/Obesity;Stress;Hypertension;Lipids  Weight Management/Obesity;Stress;Hypertension;Lipids  Weight Management/Obesity;Stress;Hypertension;Lipids    Review  Pt with multiple CAD RFs willing to participate in CR exercise.  SteNicole Kindreduld like to increase his walking distance.  Pt with multiple CAD RFs willing to participate in CR exercise.  SteNicole Kindred tolerating his first weeks of exercise in cardiac rehab.  Pt with multiple CAD RFs willing to participate in CR exercise.  SteNicole Kindredntinues to tolerate exercise well.  He will graduate on 06/18/2019.  He feels that it was helpful for him and his health to come to CR. He was able to increase his walking distance, a goal of his.  Pt with multiple CAD RFs willing to participate in CR exercise. SteNicole Kindredaduated on 06/18/2019.  He feels that it was helpful for him and his health to come to CR. He was able to increase his walking distance, a goal of his.    Expected Outcomes  Pt will continue to participate in CR exercise, nutrition, and lifestyle modification opportunities.  Pt will continue to participate in CR exercise, nutrition, and lifestyle modification opportunities.  Pt will continue to participate in CR exercise, nutrition, and lifestyle modification opportunities.  Pt will continue to  participate in exercise, nutrition, and lifestyle modification opportunities.  He plans to walk five days a week.       Exercise Goals and Review: Exercise Goals    Row Name 05/06/19 1138             Exercise Goals   Increase Physical Activity  Yes       Intervention  Provide advice, education, support and counseling about physical activity/exercise needs.;Develop an individualized exercise prescription for aerobic and resistive training based on initial evaluation findings, risk stratification, comorbidities and participant's personal goals.       Expected Outcomes  Short Term: Attend rehab on a regular basis to increase amount of physical activity.;Long Term: Add in home exercise to make exercise part of routine and to increase amount of physical activity.;Long Term: Exercising regularly at least 3-5 days a week.       Increase Strength and Stamina  Yes       Intervention  Provide advice, education, support and counseling about physical activity/exercise needs.;Develop an individualized exercise prescription for aerobic and resistive training based on initial evaluation findings, risk stratification, comorbidities and participant's personal goals.       Expected Outcomes  Short Term: Increase workloads from initial exercise prescription for resistance, speed, and METs.;Short Term: Perform resistance training exercises routinely during rehab and add in resistance training at home;Long Term: Improve cardiorespiratory fitness, muscular endurance and strength as measured by increased METs and functional capacity (6MWT)       Able to understand and use rate of perceived exertion (RPE) scale  Yes       Intervention  Provide education and explanation on how to use RPE scale       Expected Outcomes  Short Term: Able to use RPE daily in rehab to express subjective intensity level;Long Term:  Able to use RPE to guide intensity level when exercising independently       Knowledge and understanding of  Target Heart Rate Range (THRR)  Yes       Intervention  Provide education and explanation of THRR including how the numbers were predicted and where they are located for reference       Expected Outcomes  Short Term: Able to state/look up THRR;Long Term: Able to use THRR to govern intensity when exercising independently;Short Term: Able to use daily as guideline for intensity in rehab       Able to check pulse independently  Yes       Intervention  Provide education and demonstration on how to check pulse in carotid and radial arteries.;Review the importance of being able to check your own pulse for safety during independent exercise       Expected Outcomes  Short Term: Able to explain why pulse checking is important during independent exercise;Long Term: Able to check pulse independently and accurately       Understanding of Exercise Prescription  Yes       Intervention  Provide education, explanation, and written materials on patient's individual exercise prescription       Expected Outcomes  Short Term: Able to explain program exercise prescription;Long Term: Able to explain home exercise prescription to exercise independently          Exercise Goals Re-Evaluation: Exercise Goals Re-Evaluation    Row Name 05/10/19 1611 05/19/19 1611 05/24/19 1527 06/16/19 1525 06/18/19 1603     Exercise Goal Re-Evaluation   Exercise Goals Review  Increase Physical Activity;Increase Strength and Stamina;Able to understand and use rate of perceived exertion (RPE) scale;Knowledge and understanding of Target Heart Rate Range (THRR);Understanding of Exercise Prescription  Increase Physical Activity;Increase Strength and Stamina;Able to understand and use rate of perceived exertion (RPE) scale;Knowledge and understanding of Target Heart Rate Range (THRR);Able to check pulse independently;Understanding of Exercise Prescription  Increase Physical Activity;Increase Strength and Stamina;Able to understand and use rate of  perceived exertion (RPE) scale;Knowledge and understanding of Target Heart Rate Range (THRR);Able to check pulse independently;Understanding of Exercise Prescription  Increase Physical Activity;Increase Strength and Stamina;Able to understand and use rate of perceived exertion (RPE) scale;Knowledge and understanding of Target Heart Rate Range (THRR);Able to check pulse independently;Understanding of Exercise Prescription  Increase Physical Activity;Increase Strength and Stamina;Able to understand and use rate of perceived exertion (RPE) scale;Knowledge and understanding of Target Heart Rate Range (THRR);Able to check pulse independently;Understanding of Exercise Prescription   Comments  Pt first day of exercise.Pt tolerated exercise well. Pt understands RPE scale and THRR.  Reviewed goals and home exercise with Pt. Pt stated he is not exercising on days he does not come to CR. Encouraged Pt to walk 2 days per week for 30 minuteds in addition to CR.  Reviewed home exercise guidelines with patient,  and pt plans to walk, weather permitting, as his mode of home exercise.  Patient plans to continue walking upon completion of the cardiac rehab program. Pt is currently walking 12-16 minutes, 3 days/week. Pt's goal is to walk at least 20 minutes, 5 days/week as his mode of exercise post cardiac rehab. Pt's functional capacity increased 49% as measured by 6MWT, strength increased 10% as measured by grip strength test, and flexibility increased 12% as measured by sit and reach test.  Patient completed the phase 2 cardiac rehab program and plans to continue walking at home as his more of exercise. Pt accomplished goal of increasing walking distance. Pt walked 570 ft further on his 6 minute walk test.   Expected Outcomes  Will continue to monitor and progress Pt as tolerated.  Will continue to monitor and progress Pt as tolerated.  Increase workloads as tolerated to help increase cardiorespiratory fitness.  Patient will walk  at least 20 minutes, 5 days/week to maintain health and fitness gains.  Patient will continue walking at home to help maintain cardiorespiratory fitness gains.      Nutrition & Weight - Outcomes: Pre Biometrics - 05/06/19 1138      Pre Biometrics   Height  '5\' 7"'  (1.702 m)    Weight  72.7 kg    Waist Circumference  39.5 inches    Hip Circumference  38 inches    Waist to Hip Ratio  1.04 %    BMI (Calculated)  25.1    Triceps Skinfold  21 mm    % Body Fat  28.4 %    Grip Strength  36 kg    Flexibility  8 in    Single Leg Stand  30 seconds      Post Biometrics - 06/18/19 1517       Post  Biometrics   Height  '5\' 7"'  (1.702 m)    Weight  74.6 kg    Waist Circumference  39.75 inches    Hip Circumference  37.5 inches    Waist to Hip Ratio  1.06 %    BMI (Calculated)  25.75    Triceps Skinfold  12 mm    % Body Fat  26.5 %    Grip Strength  39.5 kg    Flexibility  9 in    Single Leg Stand  21.12 seconds       Nutrition:   Nutrition Discharge:   Education Questionnaire Score: Knowledge Questionnaire Score - 05/06/19 1039      Knowledge Questionnaire Score   Pre Score  23/24       Goals reviewed with patient; copy given to patient.

## 2019-07-01 NOTE — Progress Notes (Signed)
HPI: FU coronary disease. He had PCI of his LAD and right coronary artery 2003. His ejection fraction was 30%. Has ICD. Abd ultrasound 10/14 showed no aneurysm.  Echocardiogram April 2020 showed ejection fraction 20 to 25%, mild left atrial enlargement, moderate mitral regurgitation, mild aortic insufficiency.  Cardiac catheterization April 2020 showed patent LAD stent, 50% distal LAD, 95% distal right coronary artery which was treated with a drug-eluting stent.  Carotid Dopplers April 2020 showed no significant obstruction.  Since I last saw him, the patient has dyspnea with more extreme activities but not with routine activities. It is relieved with rest. It is not associated with chest pain. There is no orthopnea, PND or pedal edema. There is no syncope or palpitations. There is no exertional chest pain.   Current Outpatient Medications  Medication Sig Dispense Refill  . aspirin 81 MG chewable tablet Chew 1 tablet (81 mg total) by mouth daily for 30 days. (Patient taking differently: Chew 81 mg by mouth daily. ) 30 tablet 0  . benazepril (LOTENSIN) 10 MG tablet TAKE ONE TABLET BY MOUTH DAILY 90 tablet 3  . carvedilol (COREG) 12.5 MG tablet Take 1 tablet (12.5 mg total) by mouth 2 (two) times daily with a meal for 30 days. 60 tablet 0  . escitalopram (LEXAPRO) 10 MG tablet Take 10 mg by mouth daily.    . finasteride (PROSCAR) 5 MG tablet Take 5 mg by mouth daily.    Marland Kitchen LORazepam (ATIVAN) 0.5 MG tablet Take 0.5-1 mg by mouth See admin instructions. Take 1-2 tablets as needed for Anxiety    . simvastatin (ZOCOR) 40 MG tablet TAKE ONE TABLET BY MOUTH DAILY AT 6PM 90 tablet 0  . ticagrelor (BRILINTA) 90 MG TABS tablet Take 1 tablet (90 mg total) by mouth 2 (two) times daily. 180 tablet 3  . zolpidem (AMBIEN) 10 MG tablet Take 10 mg by mouth at bedtime as needed for sleep.      No current facility-administered medications for this visit.      Past Medical History:  Diagnosis Date  .  Anxiety    Takes Ativan as needed  . Cardiac dysrhythmia, unspecified   . CHF (congestive heart failure) ([place])   . Coronary artery disease    Hx of cardiac caths   . Enlarged prostate   . GERD (gastroesophageal reflux disease)    Takes prilosec  . Gout   . Heart attack ([place])   . HTN (hypertension)   . Hyperkalemia   . Hyperlipemia   . Ischemic cardiomyopathy   . LV dysfunction   . Myocardial infarct ([place]) 1988  . Panic attacks   . Personal history of peptic ulcer disease   . Renal insufficiency   . Syncope and collapse   . Ventricular tachycardia [facility])     Past Surgical History:  Procedure Laterality Date  . CARDIAC CATHETERIZATION  2003  . CARDIAC DEFIBRILLATOR PLACEMENT    . COLONOSCOPY    . CORONARY ANGIOPLASTY    . CORONARY STENT INTERVENTION N/A 02/22/2019   Procedure: CORONARY STENT INTERVENTION;  Surgeon: Jettie Booze, MD;  Location: [place] [facility];  Service: Cardiovascular;  Laterality: N/A;  . ESOPHAGOGASTRODUODENOSCOPY     with biopsy  . HERNIA REPAIR  01/01/2012  . INSERT / REPLACE / REMOVE PACEMAKER  2005   ICD; replaced in Jan 2013  . LEAD REVISION N/A 11/08/2011   Procedure: LEAD REVISION;  Surgeon: Thompson Grayer, MD;  Location: [facility] CATH LAB;  Service: Cardiovascular;  Laterality: N/A;  . LEFT HEART CATH AND CORONARY ANGIOGRAPHY N/A 02/22/2019   Procedure: LEFT HEART CATH AND CORONARY ANGIOGRAPHY;  Surgeon: Jettie Booze, MD;  Location: [place] [facility];  Service: Cardiovascular;  Laterality: N/A;  . UMBILICAL HERNIA REPAIR    . UMBILICAL HERNIA REPAIR  01/01/2012   Procedure: HERNIA REPAIR UMBILICAL ADULT;  Surgeon: Merrie Roof, MD;  Location: [place];  Service: General;  Laterality: N/A;  umbilical hernia repair with mesh    Social History   Socioeconomic History  . Marital status: Married    Spouse name: Not on file  . Number of children: Not on file  . Years of education: Not on file  . Highest education level: Not on file   Occupational History  . Not on file  Social Needs  . Financial resource strain: Not on file  . Food insecurity    Worry: Not on file    Inability: Not on file  . Transportation needs    Medical: Not on file    Non-medical: Not on file  Tobacco Use  . Smoking status: Never Smoker  . Smokeless tobacco: Never Used  Substance and Sexual Activity  . Alcohol use: Yes    Comment: occassionally  . Drug use: No  . Sexual activity: Not Currently  Lifestyle  . Physical activity    Days per week: Not on file    Minutes per session: Not on file  . Stress: Not on file  Relationships  . Social Herbalist on phone: Not on file    Gets together: Not on file    Attends religious service: Not on file    Active member of club or organization: Not on file    Attends meetings of clubs or organizations: Not on file    Relationship status: Not on file  . Intimate partner violence    Fear of current or ex partner: Not on file    Emotionally abused: Not on file    Physically abused: Not on file    Forced sexual activity: Not on file  Other Topics Concern  . Not on file  Social History Narrative  . Not on file    Family History  Problem Relation Age of Onset  . Heart attack Father        x2  . Heart disease Father   . Cancer Father        bladder and prostate  . Anesthesia problems Neg Hx   . Hypotension Neg Hx   . Malignant hyperthermia Neg Hx   . Pseudochol deficiency Neg Hx     ROS: Some left knee pain and insomnia but no fevers or chills, productive cough, hemoptysis, dysphasia, odynophagia, melena, hematochezia, dysuria, hematuria, rash, seizure activity, orthopnea, PND, pedal edema, claudication. Remaining systems are negative.  Physical Exam: Well-developed well-nourished in no acute distress.  Skin is warm and dry.  HEENT is normal.  Neck is supple.  Chest is clear to auscultation with normal expansion.  Cardiovascular exam is regular rate and rhythm.   Abdominal exam nontender or distended. No masses palpated. Extremities show no edema. neuro grossly intact  A/P  1 coronary artery disease-patient is status post recent PCI.  He has not had further chest pain.  Continue medical therapy with aspirin and statin.  2 hypertension-blood pressure is controlled today.  Continue present medications and follow.  3 hyperlipidemia-continue statin.  4 ischemic cardiomyopathy-patient is not volume overloaded  with no CHF symptoms.  Continue beta-blocker.  Patient apparently has an allergy to Entresto (hyperkalemia).  We will continue with ACE inhibitor.  5 prior ICD-followed by electrophysiology.  Kirk Ruths, MD

## 2019-07-06 ENCOUNTER — Other Ambulatory Visit: Payer: Self-pay

## 2019-07-06 ENCOUNTER — Ambulatory Visit (INDEPENDENT_AMBULATORY_CARE_PROVIDER_SITE_OTHER): Payer: Medicare Other | Admitting: Cardiology

## 2019-07-06 ENCOUNTER — Encounter: Payer: Self-pay | Admitting: Cardiology

## 2019-07-06 VITALS — BP 110/74 | HR 63 | Temp 97.0°F | Ht 67.0 in | Wt 160.0 lb

## 2019-07-06 DIAGNOSIS — I10 Essential (primary) hypertension: Secondary | ICD-10-CM | POA: Diagnosis not present

## 2019-07-06 DIAGNOSIS — I255 Ischemic cardiomyopathy: Secondary | ICD-10-CM

## 2019-07-06 DIAGNOSIS — E785 Hyperlipidemia, unspecified: Secondary | ICD-10-CM

## 2019-07-06 DIAGNOSIS — I2510 Atherosclerotic heart disease of native coronary artery without angina pectoris: Secondary | ICD-10-CM

## 2019-07-06 DIAGNOSIS — Z9861 Coronary angioplasty status: Secondary | ICD-10-CM

## 2019-07-06 NOTE — Patient Instructions (Signed)
Medication Instructions:  The current medical regimen is effective;  continue present plan and medications.  If you need a refill on your cardiac medications before your next appointment, please call your pharmacy.   Follow-Up: At [facility], you and your health needs are our priority.  As part of our continuing mission to provide you with exceptional heart care, we have created designated Provider Care Teams.  These Care Teams include your primary Cardiologist (physician) and Advanced Practice Providers (APPs -  Physician Assistants and Nurse Practitioners) who all work together to provide you with the care you need, when you need it. You will need a follow up appointment in 6 months.  Please call our office 2 months in advance to schedule this appointment.  You may see Brian Crenshaw, MD or one of the following Advanced Practice Providers on your designated Care Team:   Luke Kilroy, PA-C Krista Kroeger, PA-C . Callie Goodrich, PA-C     

## 2019-09-08 ENCOUNTER — Ambulatory Visit (INDEPENDENT_AMBULATORY_CARE_PROVIDER_SITE_OTHER): Payer: Medicare Other | Admitting: *Deleted

## 2019-09-08 DIAGNOSIS — I5020 Unspecified systolic (congestive) heart failure: Secondary | ICD-10-CM

## 2019-09-08 DIAGNOSIS — I472 Ventricular tachycardia, unspecified: Secondary | ICD-10-CM

## 2019-09-08 LAB — CUP PACEART REMOTE DEVICE CHECK
Battery Voltage: 2.82 V
Brady Statistic RV Percent Paced: 0.06 %
Date Time Interrogation Session: 20201111050657
HighPow Impedance: 304 Ohm
HighPow Impedance: 62 Ohm
Implantable Lead Implant Date: 20130111
Implantable Lead Location: 753860
Implantable Lead Model: 6935
Implantable Pulse Generator Implant Date: 20130111
Lead Channel Impedance Value: 361 Ohm
Lead Channel Pacing Threshold Amplitude: 0.375 V
Lead Channel Pacing Threshold Pulse Width: 0.4 ms
Lead Channel Sensing Intrinsic Amplitude: 7.25 mV
Lead Channel Sensing Intrinsic Amplitude: 7.25 mV
Lead Channel Setting Pacing Amplitude: 2.5 V
Lead Channel Setting Pacing Pulse Width: 0.4 ms
Lead Channel Setting Sensing Sensitivity: 0.3 mV

## 2019-09-20 ENCOUNTER — Other Ambulatory Visit: Payer: Self-pay

## 2019-09-20 MED ORDER — CARVEDILOL 12.5 MG PO TABS: 12.5000 mg | ORAL_TABLET | Freq: Two times a day (BID) | ORAL | 1 refills | Status: DC

## 2019-09-29 NOTE — Progress Notes (Signed)
Remote ICD transmission.   

## 2019-11-14 ENCOUNTER — Ambulatory Visit: Payer: Medicare Other | Attending: Internal Medicine

## 2019-11-14 DIAGNOSIS — Z23 Encounter for immunization: Secondary | ICD-10-CM | POA: Insufficient documentation

## 2019-11-16 ENCOUNTER — Other Ambulatory Visit: Payer: Self-pay | Admitting: Cardiology

## 2019-12-04 ENCOUNTER — Ambulatory Visit: Payer: Medicare PPO | Attending: Internal Medicine

## 2019-12-04 DIAGNOSIS — Z23 Encounter for immunization: Secondary | ICD-10-CM

## 2019-12-04 NOTE — Progress Notes (Signed)
   Covid-19 Vaccination Clinic  Name:  Adrian Pace    MRN: [id number] DOB: December 07, 1932  12/04/2019  [name] was observed post Covid-19 immunization for 15 minutes without incidence. He was provided with Vaccine Information Sheet and instruction to access the V-Safe system.   [name] was instructed to call 911 with any severe reactions post vaccine: Marland Kitchen Difficulty breathing  . Swelling of your face and throat  . A fast heartbeat  . A bad rash all over your body  . Dizziness and weakness    Immunizations Administered    Name Date Dose VIS Date Route   Pfizer COVID-19 Vaccine 12/04/2019 10:32 AM 0.3 mL 10/08/2019 Intramuscular   Manufacturer: [place]   Lot: [id number]   [place]: [id number]

## 2019-12-08 ENCOUNTER — Ambulatory Visit (INDEPENDENT_AMBULATORY_CARE_PROVIDER_SITE_OTHER): Payer: Medicare PPO | Admitting: *Deleted

## 2019-12-08 DIAGNOSIS — I472 Ventricular tachycardia, unspecified: Secondary | ICD-10-CM

## 2019-12-08 LAB — CUP PACEART REMOTE DEVICE CHECK
Battery Voltage: 2.75 V
Brady Statistic RV Percent Paced: 0.07 %
Date Time Interrogation Session: 20210210022603
HighPow Impedance: 304 Ohm
HighPow Impedance: 55 Ohm
Implantable Lead Implant Date: 20130111
Implantable Lead Location: 753860
Implantable Lead Model: 6935
Implantable Pulse Generator Implant Date: 20130111
Lead Channel Impedance Value: 399 Ohm
Lead Channel Pacing Threshold Amplitude: 0.5 V
Lead Channel Pacing Threshold Pulse Width: 0.4 ms
Lead Channel Sensing Intrinsic Amplitude: 5.875 mV
Lead Channel Sensing Intrinsic Amplitude: 5.875 mV
Lead Channel Setting Pacing Amplitude: 2.5 V
Lead Channel Setting Pacing Pulse Width: 0.4 ms
Lead Channel Setting Sensing Sensitivity: 0.3 mV

## 2019-12-08 NOTE — Progress Notes (Signed)
ICD Remote  

## 2019-12-24 NOTE — Progress Notes (Signed)
HPI: FU coronary disease. He had PCI of his LAD and right coronary artery 2003. His ejection fraction was 30%. Has ICD. Abd ultrasound 10/14 showed no aneurysm. Echocardiogram April 2020 showed ejection fraction 20 to 25%, mild left atrial enlargement, moderate mitral regurgitation, mild aortic insufficiency.  Cardiac catheterization April 2020 showed patent LAD stent, 50% distal LAD, 95% distal right coronary artery which was treated with a drug-eluting stent.  Carotid Dopplers April 2020 showed no significant obstruction.  Since I last saw him, he has an occasional dyspneic sensation not related to activities.  No orthopnea, PND, pedal edema, exertional chest pain or syncope.  Current Outpatient Medications  Medication Sig Dispense Refill  . benazepril (LOTENSIN) 10 MG tablet TAKE ONE TABLET BY MOUTH DAILY 90 tablet 3  . carvedilol (COREG) 12.5 MG tablet TAKE ONE TABLET BY MOUTH TWICE A DAY WITH A MEAL 120 tablet 0  . escitalopram (LEXAPRO) 10 MG tablet Take 10 mg by mouth daily.    . finasteride (PROSCAR) 5 MG tablet Take 5 mg by mouth daily.    Marland Kitchen LORazepam (ATIVAN) 0.5 MG tablet Take 0.5-1 mg by mouth See admin instructions. Take 1-2 tablets as needed for Anxiety    . simvastatin (ZOCOR) 40 MG tablet TAKE ONE TABLET BY MOUTH DAILY AT 6PM 90 tablet 0  . ticagrelor (BRILINTA) 90 MG TABS tablet Take 1 tablet (90 mg total) by mouth 2 (two) times daily. 180 tablet 3  . aspirin 81 MG chewable tablet Chew 1 tablet (81 mg total) by mouth daily for 30 days. (Patient taking differently: Chew 81 mg by mouth daily. ) 30 tablet 0  . zolpidem (AMBIEN) 10 MG tablet Take 10 mg by mouth at bedtime as needed for sleep.      No current facility-administered medications for this visit.     Past Medical History:  Diagnosis Date  . Anxiety    Takes Ativan as needed  . Cardiac dysrhythmia, unspecified   . CHF (congestive heart failure) ([place])   . Coronary artery disease    Hx of cardiac caths   .  Enlarged prostate   . GERD (gastroesophageal reflux disease)    Takes prilosec  . Gout   . Heart attack ([place])   . HTN (hypertension)   . Hyperkalemia   . Hyperlipemia   . Ischemic cardiomyopathy   . LV dysfunction   . Myocardial infarct ([place]) 1988  . Panic attacks   . Personal history of peptic ulcer disease   . Renal insufficiency   . Syncope and collapse   . Ventricular tachycardia [facility])     Past Surgical History:  Procedure Laterality Date  . CARDIAC CATHETERIZATION  2003  . CARDIAC DEFIBRILLATOR PLACEMENT    . COLONOSCOPY    . CORONARY ANGIOPLASTY    . CORONARY STENT INTERVENTION N/A 02/22/2019   Procedure: CORONARY STENT INTERVENTION;  Surgeon: Jettie Booze, MD;  Location: [place] [facility];  Service: Cardiovascular;  Laterality: N/A;  . ESOPHAGOGASTRODUODENOSCOPY     with biopsy  . HERNIA REPAIR  01/01/2012  . INSERT / REPLACE / REMOVE PACEMAKER  2005   ICD; replaced in Jan 2013  . LEAD REVISION N/A 11/08/2011   Procedure: LEAD REVISION;  Surgeon: Thompson Grayer, MD;  Location: [facility] CATH LAB;  Service: Cardiovascular;  Laterality: N/A;  . LEFT HEART CATH AND CORONARY ANGIOGRAPHY N/A 02/22/2019   Procedure: LEFT HEART CATH AND CORONARY ANGIOGRAPHY;  Surgeon: Jettie Booze, MD;  Location: [facility]  INVASIVE CV LAB;  Service: Cardiovascular;  Laterality: N/A;  . UMBILICAL HERNIA REPAIR    . UMBILICAL HERNIA REPAIR  01/01/2012   Procedure: HERNIA REPAIR UMBILICAL ADULT;  Surgeon: Merrie Roof, MD;  Location: [place];  Service: General;  Laterality: N/A;  umbilical hernia repair with mesh    Social History   Socioeconomic History  . Marital status: Married    Spouse name: Not on file  . Number of children: Not on file  . Years of education: Not on file  . Highest education level: Not on file  Occupational History  . Not on file  Tobacco Use  . Smoking status: Never Smoker  . Smokeless tobacco: Never Used  Substance and Sexual Activity  . Alcohol use: Yes     Comment: occassionally  . Drug use: No  . Sexual activity: Not Currently  Other Topics Concern  . Not on file  Social History Narrative  . Not on file   Social Determinants of Health   Financial Resource Strain:   . Difficulty of Paying Living Expenses: Not on file  Food Insecurity:   . Worried About Charity fundraiser in the Last Year: Not on file  . Ran Out of Food in the Last Year: Not on file  Transportation Needs:   . Lack of Transportation (Medical): Not on file  . Lack of Transportation (Non-Medical): Not on file  Physical Activity:   . Days of Exercise per Week: Not on file  . Minutes of Exercise per Session: Not on file  Stress:   . Feeling of Stress : Not on file  Social Connections:   . Frequency of Communication with Friends and Family: Not on file  . Frequency of Social Gatherings with Friends and Family: Not on file  . Attends Religious Services: Not on file  . Active Member of Clubs or Organizations: Not on file  . Attends Archivist Meetings: Not on file  . Marital Status: Not on file  Intimate Partner Violence:   . Fear of Current or Ex-Partner: Not on file  . Emotionally Abused: Not on file  . Physically Abused: Not on file  . Sexually Abused: Not on file    Family History  Problem Relation Age of Onset  . Heart attack Father        x2  . Heart disease Father   . Cancer Father        bladder and prostate  . Anesthesia problems Neg Hx   . Hypotension Neg Hx   . Malignant hyperthermia Neg Hx   . Pseudochol deficiency Neg Hx     ROS: Some pain in left foot but no fevers or chills, productive cough, hemoptysis, dysphasia, odynophagia, melena, hematochezia, dysuria, hematuria, rash, seizure activity, orthopnea, PND, pedal edema, claudication. Remaining systems are negative.  Physical Exam: Well-developed well-nourished in no acute distress.  Skin is warm and dry.  HEENT is normal.  Neck is supple.  Chest is clear to auscultation with  normal expansion.  Cardiovascular exam is irregular Abdominal exam nontender or distended. No masses palpated. Extremities show no edema. neuro grossly intact  Cardiogram personally reviewed and shows sinus rhythm, anterior lateral infarct.  A/P  1 coronary artery disease-patient denies any recurrent chest pain.  Plan to continue aspirin and statin.  Discontinue Brilinta at end of April.  2 hypertension-blood pressure controlled.  Continue present medical regimen.  Check potassium and renal function.  3 hyperlipidemia-continue statin.  Check lipids and  liver.  4 ischemic cardiomyopathy-we will continue ACE inhibitor and beta-blocker.  By report patient had hyperkalemia with Entresto in the past.  He does not appear to be volume overloaded on examination.  5 ICD-followed by electrophysiology.  Kirk Ruths, MD

## 2020-01-03 ENCOUNTER — Encounter: Payer: Self-pay | Admitting: Cardiology

## 2020-01-03 ENCOUNTER — Other Ambulatory Visit: Payer: Self-pay

## 2020-01-03 ENCOUNTER — Ambulatory Visit (INDEPENDENT_AMBULATORY_CARE_PROVIDER_SITE_OTHER): Payer: Medicare PPO | Admitting: Cardiology

## 2020-01-03 VITALS — BP 118/62 | HR 85 | Temp 96.0°F | Ht 67.0 in | Wt 160.8 lb

## 2020-01-03 DIAGNOSIS — Z9861 Coronary angioplasty status: Secondary | ICD-10-CM

## 2020-01-03 DIAGNOSIS — I10 Essential (primary) hypertension: Secondary | ICD-10-CM

## 2020-01-03 DIAGNOSIS — I2510 Atherosclerotic heart disease of native coronary artery without angina pectoris: Secondary | ICD-10-CM

## 2020-01-03 DIAGNOSIS — E7800 Pure hypercholesterolemia, unspecified: Secondary | ICD-10-CM

## 2020-01-03 DIAGNOSIS — I255 Ischemic cardiomyopathy: Secondary | ICD-10-CM

## 2020-01-03 DIAGNOSIS — E785 Hyperlipidemia, unspecified: Secondary | ICD-10-CM

## 2020-01-03 NOTE — Patient Instructions (Signed)
Medication Instructions:  STOP TICAGRELOR THE END OF APRIL *If you need a refill on your cardiac medications before your next appointment, please call your pharmacy*   Lab Work: Your physician recommends that you HAVE LAB WORK TODAY If you have labs (blood work) drawn today and your tests are completely normal, you will receive your results only by: Marland Kitchen MyChart Message (if you have MyChart) OR . A paper copy in the mail If you have any lab test that is abnormal or we need to change your treatment, we will call you to review the results.   Follow-Up: At [facility], you and your health needs are our priority.  As part of our continuing mission to provide you with exceptional heart care, we have created designated Provider Care Teams.  These Care Teams include your primary Cardiologist (physician) and Advanced Practice Providers (APPs -  Physician Assistants and Nurse Practitioners) who all work together to provide you with the care you need, when you need it.  We recommend signing up for the patient portal called "MyChart".  Sign up information is provided on this After Visit Summary.  MyChart is used to connect with patients for Virtual Visits (Telemedicine).  Patients are able to view lab/test results, encounter notes, upcoming appointments, etc.  Non-urgent messages can be sent to your provider as well.   To learn more about what you can do with MyChart, go to [email or web address].    Your next appointment:   6 month(s)  The format for your next appointment:   Either In Person or Virtual  Provider:   You may see Kirk Ruths, MD or one of the following Advanced Practice Providers on your designated Care Team:    Kerin Ransom, PA-C  [place], [place]  Coletta Memos, [place]

## 2020-01-04 LAB — COMPREHENSIVE METABOLIC PANEL
ALT: 15 IU/L (ref 0–44)
AST: 19 IU/L (ref 0–40)
Albumin/Globulin Ratio: 2.1 (ref 1.2–2.2)
Albumin: 4.4 g/dL (ref 3.6–4.6)
Alkaline Phosphatase: 89 IU/L (ref 39–117)
BUN/Creatinine Ratio: 15 (ref 10–24)
BUN: 19 mg/dL (ref 8–27)
Bilirubin Total: 0.6 mg/dL (ref 0.0–1.2)
CO2: 21 mmol/L (ref 20–29)
Calcium: 9.1 mg/dL (ref 8.6–10.2)
Chloride: 105 mmol/L (ref 96–106)
Creatinine, Ser: 1.25 mg/dL (ref 0.76–1.27)
GFR calc Af Amer: 60 mL/min/{1.73_m2} (ref >59)
GFR calc non Af Amer: 52 mL/min/{1.73_m2} — ABNORMAL LOW (ref >59)
Globulin, Total: 2.1 g/dL (ref 1.5–4.5)
Glucose: 88 mg/dL (ref 65–99)
Potassium: 5.4 mmol/L — ABNORMAL HIGH (ref 3.5–5.2)
Sodium: 141 mmol/L (ref 134–144)
Total Protein: 6.5 g/dL (ref 6.0–8.5)

## 2020-01-04 LAB — LIPID PANEL
Chol/HDL Ratio: 3 ratio (ref 0.0–5.0)
Cholesterol, Total: 97 mg/dL — ABNORMAL LOW (ref 100–199)
HDL: 32 mg/dL — ABNORMAL LOW (ref >39)
LDL Chol Calc (NIH): 48 mg/dL (ref 0–99)
Triglycerides: 87 mg/dL (ref 0–149)
VLDL Cholesterol Cal: 17 mg/dL (ref 5–40)

## 2020-01-10 ENCOUNTER — Other Ambulatory Visit: Payer: Self-pay | Admitting: Cardiology

## 2020-01-16 ENCOUNTER — Other Ambulatory Visit: Payer: Self-pay | Admitting: Cardiology

## 2020-01-27 ENCOUNTER — Telehealth: Payer: Self-pay | Admitting: Cardiology

## 2020-01-27 NOTE — Telephone Encounter (Signed)
DC brilinta and continue asa Kirk Ruths

## 2020-01-27 NOTE — Telephone Encounter (Signed)
Pt c/o Shortness Of Breath: STAT if SOB developed within the last 24 hours or pt is noticeably SOB on the phone  1. Are you currently SOB (can you hear that pt is SOB on the phone)? no  2. How long have you been experiencing SOB? Before March 8th appt  3. Are you SOB when sitting or when up moving around? All the time. It is more noticeable when he is sitting down   4. Are you currently experiencing any other symptoms? No    The patient says he gets SOB and has to take a few deep breaths before it goes away. He notices it more when he is sitting watching TV. He mentioned it to Dr. Stanford Breed at his last appointment but it still occurs every so often.

## 2020-01-27 NOTE — Telephone Encounter (Signed)
Patient states  This has been going since  Feb. 2021. He states he mention this symptoms at March 8  Appointment with Dr Stanford Breed.    patient states she did not have any shortness of breath issue until  He started [place] . Patient states it is similar to that feeling.  He states not pain otherwise.  He is having trouble sleeping  ( using Lorazepam)   , no weight gain, no bloating sensation , RN asked  If it was mention for patient to drink a little caffeine in the morning prior to taking BRILINTA dose - patient states he did initial when he started the medication , but now he does not use any  Caffeine  Products.  patient aware will defer to Dr Stanford Breed for further instructions

## 2020-01-28 NOTE — Telephone Encounter (Signed)
Spoke with pt, Aware of dr crenshaw's recommendations.  °

## 2020-02-14 ENCOUNTER — Telehealth: Payer: Self-pay | Admitting: Cardiology

## 2020-02-14 DIAGNOSIS — I255 Ischemic cardiomyopathy: Secondary | ICD-10-CM

## 2020-02-14 MED ORDER — FUROSEMIDE 20 MG PO TABS: 20.0000 mg | ORAL_TABLET | Freq: Every day | ORAL | 3 refills | Status: DC

## 2020-02-14 NOTE — Telephone Encounter (Signed)
Lasix 20 mg daily.  BMET in 1 week. Kirk Ruths

## 2020-02-14 NOTE — Telephone Encounter (Signed)
Spoke with pt, Aware of dr crenshaw's recommendations. New script sent to the pharmacy and Lab orders mailed to the pt  

## 2020-02-14 NOTE — Telephone Encounter (Signed)
Pt c/o swelling: STAT is pt has developed SOB within 24 hours  1) How much weight have you gained and in what time span? 10 lbs in two weeks   2) If swelling, where is the swelling located? Ankles and Top of Feet   3) Are you currently taking a fluid pill? No   4) Are you currently SOB? No   5) Do you have a log of your daily weights (if so, list)? No   6) Have you gained 3 pounds in a day or 5 pounds in a week? Yes, 5 lbs in a week   7) Have you traveled recently? No    Adrian Pace is calling stating he started experiencing swelling in his ankles and the top of his feet 3 weeks after being taken off Brilinta. He states he has gained 10 lbs in 2 weeks due it. He is not experiencing any other symptoms. Please advise.

## 2020-02-14 NOTE — Telephone Encounter (Signed)
Spoke to patient he stated he has gained appox 10 lbs in the last 10 days.He has swelling in both lower legs and feet.No sob.No chest pain.Stated he is trying to follow a low salt diet.Advised I will send message to [facility] for advice.

## 2020-02-19 ENCOUNTER — Telehealth: Payer: Self-pay | Admitting: Nurse Practitioner

## 2020-02-19 NOTE — Telephone Encounter (Signed)
   Patient called this afternoon to report that despite being prescribed Lasix 20 mg daily on April 19, he has not noticed any improvement in lower extremity swelling.  He has not weighed himself since that time but does not think his weight is gone up any further.  He does not think edema is worse, just unchanged.  He is due for labs this coming week.  I advised that he may increase Lasix to 40 mg daily but that he must come in on Monday for labs.  As we are now having to titrate his diuretic, I also advised that I will send a message to the office to arrange for follow-up visit this coming week.  Finally, I advised that if swelling worsens, weight increases, or he begins experiencing dyspnea, that he should have a low threshold to be evaluated in the emergency room this weekend.  Caller verbalized understanding and was grateful for the call back.  Murray Hodgkins, NP 02/19/2020, 12:24 PM

## 2020-02-21 LAB — BASIC METABOLIC PANEL
BUN/Creatinine Ratio: 15 (ref 10–24)
BUN: 24 mg/dL (ref 8–27)
CO2: 26 mmol/L (ref 20–29)
Calcium: 9.3 mg/dL (ref 8.6–10.2)
Chloride: 102 mmol/L (ref 96–106)
Creatinine, Ser: 1.61 mg/dL — ABNORMAL HIGH (ref 0.76–1.27)
GFR calc Af Amer: 44 mL/min/{1.73_m2} — ABNORMAL LOW (ref >59)
GFR calc non Af Amer: 38 mL/min/{1.73_m2} — ABNORMAL LOW (ref >59)
Glucose: 97 mg/dL (ref 65–99)
Potassium: 4.5 mmol/L (ref 3.5–5.2)
Sodium: 144 mmol/L (ref 134–144)

## 2020-02-21 NOTE — Telephone Encounter (Signed)
Left message for pt to call.

## 2020-02-21 NOTE — Telephone Encounter (Signed)
Follow up scheduled

## 2020-02-23 NOTE — Progress Notes (Signed)
Cardiology Clinic Note   Patient Name: Adrian Pace Date of Encounter: 02/24/2020  Primary Care Provider:  Kelton Pillar, MD Primary Cardiologist:  Kirk Ruths, MD  Patient Profile    Adrian Pace 86 year old male presents today for follow-up of his lower extremity edema.  Past Medical History    Past Medical History:  Diagnosis Date  . Anxiety    Takes Ativan as needed  . Cardiac dysrhythmia, unspecified   . CHF (congestive heart failure) ([place])   . Coronary artery disease    Hx of cardiac caths   . Enlarged prostate   . GERD (gastroesophageal reflux disease)    Takes prilosec  . Gout   . Heart attack ([place])   . HTN (hypertension)   . Hyperkalemia   . Hyperlipemia   . Ischemic cardiomyopathy   . LV dysfunction   . Myocardial infarct ([place]) 1988  . Panic attacks   . Personal history of peptic ulcer disease   . Renal insufficiency   . Syncope and collapse   . Ventricular tachycardia [facility])    Past Surgical History:  Procedure Laterality Date  . CARDIAC CATHETERIZATION  2003  . CARDIAC DEFIBRILLATOR PLACEMENT    . COLONOSCOPY    . CORONARY ANGIOPLASTY    . CORONARY STENT INTERVENTION N/A 02/22/2019   Procedure: CORONARY STENT INTERVENTION;  Surgeon: Jettie Booze, MD;  Location: [place] CV LAB;  Service: Cardiovascular;  Laterality: N/A;  . ESOPHAGOGASTRODUODENOSCOPY     with biopsy  . HERNIA REPAIR  01/01/2012  . INSERT / REPLACE / REMOVE PACEMAKER  2005   ICD; replaced in Jan 2013  . LEAD REVISION N/A 11/08/2011   Procedure: LEAD REVISION;  Surgeon: Thompson Grayer, MD;  Location: [facility] CATH LAB;  Service: Cardiovascular;  Laterality: N/A;  . LEFT HEART CATH AND CORONARY ANGIOGRAPHY N/A 02/22/2019   Procedure: LEFT HEART CATH AND CORONARY ANGIOGRAPHY;  Surgeon: Jettie Booze, MD;  Location: [place] CV [facility];  Service: Cardiovascular;  Laterality: N/A;  . UMBILICAL HERNIA REPAIR    . UMBILICAL HERNIA REPAIR  01/01/2012   Procedure: HERNIA  REPAIR UMBILICAL ADULT;  Surgeon: Merrie Roof, MD;  Location: [place];  Service: General;  Laterality: N/A;  umbilical hernia repair with mesh    Allergies  Allergies  Allergen Reactions  . Entresto [Sacubitril-Valsartan] Other (See Comments)    hyperkalemia  . Niacin And Related Itching and Rash  . Codeine Anxiety    History of Present Illness    Mr. Adrian Pace has a PMH of ischemic cardiomyopathy, essential hypertension, VT, CHF, syncope, coronary artery disease (PCI to LAD and RCA 2003, EF 30%), ICD, and abdominal ultrasound 10/14 showed no aneurysm, echocardiogram 4/20 showed an ejection fraction of 20-25%, mild left atrial enlargement, moderate mitral regurgitation, mild aortic insufficiency.  His cardiac catheterization 4/20 showed a patent LAD stent, 50% distal LAD, 95% distal right coronary artery, DES x1 placed, carotid Dopplers 4/20 showed no significant blockage.  He was last seen by Dr. Stanford Breed on 01/03/2020.  During that time he had occasional dyspnea that was not related to increased activity.  He denied orthopnea, PND, lower extremity edema, and exertional chest pain.  He presents the clinic today for evaluation of his lower extremity edema and states he is worried about his lower extremity swelling.  He states that he has had less swelling since increasing his furosemide.  He does continue to eat a biscuit [place]/country ham biscuit once weekly.  He has not  had or used lower extremity support stockings.  His weight has been decreasing and he is now at his old high school weight.  I will give him the salty 6 sheet, have him wear lower extremity support stockings, decrease his Lasix to 20 mg Monday, Wednesday, Friday, Sunday have him take 40 mg the other days of the week.  Repeat BMP in 1 month and have him follow-up with Dr. Stanford Breed in 3 months.  Today he denies chest pain, shortness of breath, increased lower extremity edema, fatigue, palpitations, melena, hematuria, hemoptysis,  diaphoresis, weakness, presyncope, syncope, orthopnea, and PND.   Home Medications    Prior to Admission medications   Medication Sig Start Date End Date Taking? Authorizing Provider  aspirin 81 MG chewable tablet Chew 1 tablet (81 mg total) by mouth daily for 30 days. Patient taking differently: Chew 81 mg by mouth daily.  02/24/19 07/06/19  Elodia Florence., MD  benazepril (LOTENSIN) 10 MG tablet TAKE ONE TABLET BY MOUTH DAILY 05/17/19   Erlene Quan, PA-C  carvedilol (COREG) 12.5 MG tablet Take 1 tablet (12.5 mg total) by mouth 2 (two) times daily with a meal. 01/18/20   Crenshaw, Denice Bors, MD  escitalopram (LEXAPRO) 10 MG tablet Take 10 mg by mouth daily.    [provider]  finasteride (PROSCAR) 5 MG tablet Take 5 mg by mouth daily.    [provider]  furosemide (LASIX) 20 MG tablet Take 1 tablet (20 mg total) by mouth daily. 02/14/20 05/14/20  Lelon Perla, MD  LORazepam (ATIVAN) 0.5 MG tablet Take 0.5-1 mg by mouth See admin instructions. Take 1-2 tablets as needed for Anxiety 12/08/13   [provider]  simvastatin (ZOCOR) 40 MG tablet TAKE 1 TABLET BY MOUTH DAILY AT [facility] 01/11/20   Lelon Perla, MD  zolpidem (AMBIEN) 10 MG tablet Take 10 mg by mouth at bedtime as needed for sleep.     [provider]    Family History    Family History  Problem Relation Age of Onset  . Heart attack Father        x2  . Heart disease Father   . Cancer Father        bladder and prostate  . Anesthesia problems Neg Hx   . Hypotension Neg Hx   . Malignant hyperthermia Neg Hx   . Pseudochol deficiency Neg Hx    He indicated that his mother is deceased. He indicated that his father is deceased. He indicated that his maternal grandmother is deceased. He indicated that his maternal grandfather is deceased. He indicated that his paternal grandmother is deceased. He indicated that his paternal grandfather is deceased. He indicated that the status of his neg hx  is unknown.  Social History    Social History   Socioeconomic History  . Marital status: Married    Spouse name: Not on file  . Number of children: Not on file  . Years of education: Not on file  . Highest education level: Not on file  Occupational History  . Not on file  Tobacco Use  . Smoking status: Never Smoker  . Smokeless tobacco: Never Used  Substance and Sexual Activity  . Alcohol use: Yes    Comment: occassionally  . Drug use: No  . Sexual activity: Not Currently  Other Topics Concern  . Not on file  Social History Narrative  . Not on file   Social Determinants of Health   Financial Resource Strain:   .  Difficulty of Paying Living Expenses:   Food Insecurity:   . Worried About Charity fundraiser in the Last Year:   . Arboriculturist in the Last Year:   Transportation Needs:   . Film/video editor (Medical):   Marland Kitchen Lack of Transportation (Non-Medical):   Physical Activity:   . Days of Exercise per Week:   . Minutes of Exercise per Session:   Stress:   . Feeling of Stress :   Social Connections:   . Frequency of Communication with Friends and Family:   . Frequency of Social Gatherings with Friends and Family:   . Attends Religious Services:   . Active Member of Clubs or Organizations:   . Attends Archivist Meetings:   Marland Kitchen Marital Status:   Intimate Partner Violence:   . Fear of Current or Ex-Partner:   . Emotionally Abused:   Marland Kitchen Physically Abused:   . Sexually Abused:      Review of Systems    General:  No chills, fever, night sweats or weight changes.  Cardiovascular:  No chest pain, dyspnea on exertion, edema, orthopnea, palpitations, paroxysmal nocturnal dyspnea. Dermatological: No rash, lesions/masses Respiratory: No cough, dyspnea Urologic: No hematuria, dysuria Abdominal:   No nausea, vomiting, diarrhea, bright red blood per rectum, melena, or hematemesis Neurologic:  No visual changes, wkns, changes in mental status. All other  systems reviewed and are otherwise negative except as noted above.  Physical Exam    VS:  BP 114/68   Pulse 80   Ht 5\' 7"  (1.702 m)   Wt 167 lb 3.2 oz (75.8 kg)   BMI 26.19 kg/m  , BMI Body mass index is 26.19 kg/m. GEN: Well nourished, well developed, in no acute distress. HEENT: normal. Neck: Supple, no JVD, carotid bruits, or masses. Cardiac: RRR, no murmurs, rubs, or gallops. No clubbing, cyanosis, bilateral lower extremity generalized edema.  Radials/DP/PT 2+ and equal bilaterally.  Respiratory:  Respirations regular and unlabored, clear to auscultation bilaterally. GI: Soft, nontender, nondistended, BS + x 4. MS: no deformity or atrophy. Skin: warm and dry, no rash. Neuro:  Strength and sensation are intact. Psych: Normal affect.  Accessory Clinical Findings    ECG personally reviewed by me today-none today.    Echocardiogram 02/21/2019 FINDINGS  Left Ventricle: The left ventricle has severely reduced systolic  function, with an ejection fraction of 20-25%. Left ventricular diastolic  Doppler parameters are consistent with impaired relaxation (grade I). Left  ventricular diffuse hypokinesis. Severe  akinesis of the left ventricular, apical inferior wall. Definity contrast  agent was given IV to delineate the left ventricular endocardial borders.     Left Atrium: Left atrial size was mildly dilated.   Right Atrium: Right atrial size was normal in size.   Pericardium: There is no evidence of pericardial effusion.   Mitral Valve: The mitral valve is normal in structure. Mitral valve  regurgitation is moderate by color flow Doppler.   Tricuspid Valve: The tricuspid valve was normal in structure. Tricuspid  valve regurgitation is trivial by color flow Doppler.   Aortic Valve: The aortic valve is normal in structure. Aortic valve  regurgitation is mild by color flow Doppler.   Pulmonic Valve: The pulmonic valve was normal in structure. Pulmonic valve   regurgitation is trivial by color flow Doppler.   Aorta: The aortic root is normal in size and structure.  Assessment & Plan   1.  Bilateral lower extremity edema-bilateral generalized lower extremity  edema. Continue  Lasix to 40 mg on Tuesday Thursday Saturday days and then  normal 20 mg dose on Monday Wednesday Friday Sunday. Heart healthy low-sodium diet-salty 6 given Increase physical activity as tolerated Lower extremity support stockings-aspiration given Daily weights-contact office with 3 pound weight gain overnight or 5 pounds in 1 week. Elevate extremities when not active Order BMP 1 month  Ischemic cardiomyopathy-no increased work of breathing today.  Echocardiogram 02/21/2019 LVEF 20-25% and grade 1 diastolic dysfunction Continue current medical therapy Heart healthy low-sodium diet Daily weights Increase physical activity as tolerated  Coronary artery disease-no chest pain today.  Cardiac catheterization4/20 showed a patent LAD stent, 50% distal LAD, 95% distal right coronary artery, DES x1 placed, Continue current medical therapy Heart healthy low-sodium diet Increase physical activity as tolerated  Essential hypertension-BP today 114/68.  Well-controlled at home Continue current medical therapy  Disposition: Follow-up with Dr. Stanford Breed 3 months.  Jossie Ng. Kada Friesen NP-C    02/24/2020, 9:26 AM [address] [place] [address] Office [phone] Fax [phone]

## 2020-02-24 ENCOUNTER — Ambulatory Visit: Payer: Medicare PPO | Admitting: General Practice

## 2020-02-24 ENCOUNTER — Other Ambulatory Visit: Payer: Self-pay

## 2020-02-24 ENCOUNTER — Encounter: Payer: Self-pay | Admitting: General Practice

## 2020-02-24 VITALS — BP 114/68 | HR 80 | Ht 67.0 in | Wt 167.2 lb

## 2020-02-24 DIAGNOSIS — Z9861 Coronary angioplasty status: Secondary | ICD-10-CM

## 2020-02-24 DIAGNOSIS — I255 Ischemic cardiomyopathy: Secondary | ICD-10-CM | POA: Diagnosis not present

## 2020-02-24 DIAGNOSIS — R600 Localized edema: Secondary | ICD-10-CM

## 2020-02-24 DIAGNOSIS — I2510 Atherosclerotic heart disease of native coronary artery without angina pectoris: Secondary | ICD-10-CM

## 2020-02-24 DIAGNOSIS — I10 Essential (primary) hypertension: Secondary | ICD-10-CM | POA: Diagnosis not present

## 2020-02-24 MED ORDER — FUROSEMIDE 20 MG PO TABS: ORAL_TABLET | ORAL | 3 refills | Status: DC

## 2020-02-24 NOTE — Patient Instructions (Signed)
Medication Instructions:   Take Furosemide 20 mg (1 tab) once daily every Monday, Wednesday, Friday, and Sunday. Take 40 mg (2 tab) daily every Tuesday, Thursday, and Saturday.  *If you need a refill on your cardiac medications before your next appointment, please call your pharmacy*   Lab Work: Your physician recommends that you return for lab work in 1 month. You do not need an appointment to have labs done in our office. Please bring your lab slips with you.  If you have labs (blood work) drawn today and your tests are completely normal, you will receive your results only by: Marland Kitchen MyChart Message (if you have MyChart) OR . A paper copy in the mail If you have any lab test that is abnormal or we need to change your treatment, we will call you to review the results.  Follow-Up: At [facility], you and your health needs are our priority.  As part of our continuing mission to provide you with exceptional heart care, we have created designated Provider Care Teams.  These Care Teams include your primary Cardiologist (physician) and Advanced Practice Providers (APPs -  Physician Assistants and Nurse Practitioners) who all work together to provide you with the care you need, when you need it.  We recommend signing up for the patient portal called "MyChart".  Sign up information is provided on this After Visit Summary.  MyChart is used to connect with patients for Virtual Visits (Telemedicine).  Patients are able to view lab/test results, encounter notes, upcoming appointments, etc.  Non-urgent messages can be sent to your provider as well.   To learn more about what you can do with MyChart, go to [email or web address].    Your next appointment:   3 month(s)  The format for your next appointment:   In Person  Provider:   Kirk Ruths, MD   Other Instructions Your physician recommends that you weigh, daily, at the same time every day, and in the same amount of clothing. Please record  your daily weights on the handout provided and bring it to your next appointment. Please call our office your weight increases by 3 lbs overnight or 5 lbs in one week.  Please review the Salty Six sheet given to you today.  Please purchase compression stockings to help with selling.

## 2020-03-08 ENCOUNTER — Ambulatory Visit (INDEPENDENT_AMBULATORY_CARE_PROVIDER_SITE_OTHER): Payer: Medicare PPO | Admitting: *Deleted

## 2020-03-08 DIAGNOSIS — I5020 Unspecified systolic (congestive) heart failure: Secondary | ICD-10-CM

## 2020-03-08 DIAGNOSIS — I255 Ischemic cardiomyopathy: Secondary | ICD-10-CM

## 2020-03-08 LAB — CUP PACEART REMOTE DEVICE CHECK
Battery Voltage: 2.71 V
Brady Statistic RV Percent Paced: 0.02 %
Date Time Interrogation Session: 20210512001804
HighPow Impedance: 304 Ohm
HighPow Impedance: 50 Ohm
Implantable Lead Implant Date: 20130111
Implantable Lead Location: 753860
Implantable Lead Model: 6935
Implantable Pulse Generator Implant Date: 20130111
Lead Channel Impedance Value: 399 Ohm
Lead Channel Pacing Threshold Amplitude: 0.5 V
Lead Channel Pacing Threshold Pulse Width: 0.4 ms
Lead Channel Sensing Intrinsic Amplitude: 5 mV
Lead Channel Sensing Intrinsic Amplitude: 5 mV
Lead Channel Setting Pacing Amplitude: 2.5 V
Lead Channel Setting Pacing Pulse Width: 0.4 ms
Lead Channel Setting Sensing Sensitivity: 0.3 mV

## 2020-03-10 ENCOUNTER — Telehealth: Payer: Self-pay | Admitting: General Practice

## 2020-03-10 NOTE — Progress Notes (Signed)
Remote ICD transmission.   

## 2020-03-10 NOTE — Telephone Encounter (Signed)
New message   Patient states that the top of feet are sore and it hurts to bend them. Please advise.

## 2020-03-10 NOTE — Telephone Encounter (Signed)
Called and spoke with pt. States that he had a recent appt with Coletta Memos and he was asked to get "hightop" compression socks that go up to his knees. He has been wearing them but they are difficult to get on and off. However, per pt, these seem to be helping and his feet are not swollen.   He is now complaining that this morning the tops of his feet were so sore that he could hardly walk. He states that they do not look swollen to him, or at least not like they were at his appt. He denies unusual redness or warmth. He states that he took 2 extra strength tylenol which helped some but not a lot with his walking. He reports taking lasix as well and that the pain is only in his feet and not his legs.  Notified I would send this message to Dr.Crenshaw and Coletta Memos for review and we would contact him with any recommendations. Pt verbalized understanding and had no other questions at this time.

## 2020-03-10 NOTE — Telephone Encounter (Signed)
Spoke with pt, Aware of dr crenshaw's recommendations.  °

## 2020-03-10 NOTE — Telephone Encounter (Signed)
Continue Tylenol.  Hopefully this will improve as the edema improves. Kirk Ruths

## 2020-03-20 LAB — BASIC METABOLIC PANEL
BUN/Creatinine Ratio: 14 (ref 10–24)
BUN: 21 mg/dL (ref 8–27)
CO2: 23 mmol/L (ref 20–29)
Calcium: 9 mg/dL (ref 8.6–10.2)
Chloride: 101 mmol/L (ref 96–106)
Creatinine, Ser: 1.53 mg/dL — ABNORMAL HIGH (ref 0.76–1.27)
GFR calc Af Amer: 47 mL/min/{1.73_m2} — ABNORMAL LOW (ref >59)
GFR calc non Af Amer: 41 mL/min/{1.73_m2} — ABNORMAL LOW (ref >59)
Glucose: 126 mg/dL — ABNORMAL HIGH (ref 65–99)
Potassium: 4.5 mmol/L (ref 3.5–5.2)
Sodium: 141 mmol/L (ref 134–144)

## 2020-03-23 ENCOUNTER — Telehealth: Payer: Self-pay | Admitting: General Practice

## 2020-03-23 MED ORDER — FUROSEMIDE 20 MG PO TABS: 20.0000 mg | ORAL_TABLET | Freq: Every day | ORAL | 3 refills | Status: DC

## 2020-03-23 NOTE — Telephone Encounter (Signed)
New message   Patient is returnign call for lab results. Please call.

## 2020-03-23 NOTE — Telephone Encounter (Signed)
The patient has been notified of the lab result and verbalized understanding.  All questions (if any) were answered. Frederik Schmidt, RN 03/23/2020 3:49 PM    He will decrease his Lasix to 20 mg Daily.

## 2020-03-23 NOTE — Telephone Encounter (Signed)
Follow Up  Patient is calling in again for his results. Please give patient a call back to assist.

## 2020-03-29 ENCOUNTER — Other Ambulatory Visit: Payer: Self-pay | Admitting: Cardiology

## 2020-04-17 ENCOUNTER — Telehealth: Payer: Self-pay | Admitting: General Practice

## 2020-04-17 DIAGNOSIS — R609 Edema, unspecified: Secondary | ICD-10-CM

## 2020-04-17 NOTE — Telephone Encounter (Signed)
Check ultrasound RLE to R/O DVT Kirk Ruths

## 2020-04-17 NOTE — Telephone Encounter (Signed)
New message   Patient is having issues with the socks he's wearing not compressing on his right foot. Please advise.

## 2020-04-17 NOTE — Telephone Encounter (Signed)
Spoke with patient regarding swelling in his right foot associated with pain on the top of his foot.  Denies known injury, change in breathing, pain anywhere other than top of foot, change in color or being warm to touch.  [place] swelling started this past Saturday. Denies any change in diet, eating out more, or increased sodium intake  Swelling does not go down at night. Weight remains stable at 163-164 Lasix was reduced end of May to 20 mg daily secondary to abnormal kidney functions  He continues to wear support socks but not helping right foot Tried to take Tylenol for discomfort which caused nausea and vomiting  Will forward to Dr Stanford Breed for review.

## 2020-04-17 NOTE — Telephone Encounter (Signed)
Spoke with pt, dopplers scheduled. 

## 2020-04-18 ENCOUNTER — Ambulatory Visit (HOSPITAL_COMMUNITY)
Admission: RE | Admit: 2020-04-18 | Discharge: 2020-04-18 | Disposition: A | Discharge status: Home / Self Care | Payer: Medicare PPO | Source: Ambulatory Visit | Attending: Cardiovascular Disease | Admitting: Cardiovascular Disease

## 2020-04-18 ENCOUNTER — Other Ambulatory Visit: Payer: Self-pay

## 2020-04-18 DIAGNOSIS — R609 Edema, unspecified: Secondary | ICD-10-CM | POA: Insufficient documentation

## 2020-04-20 ENCOUNTER — Ambulatory Visit
Admission: RE | Admit: 2020-04-20 | Discharge: 2020-04-20 | Disposition: A | Discharge status: Home / Self Care | Payer: Medicare PPO | Source: Ambulatory Visit | Attending: Family Medicine | Admitting: Family Medicine

## 2020-04-20 ENCOUNTER — Other Ambulatory Visit: Payer: Self-pay | Admitting: Family Medicine

## 2020-04-20 DIAGNOSIS — M79671 Pain in right foot: Secondary | ICD-10-CM

## 2020-04-21 ENCOUNTER — Telehealth: Payer: Self-pay | Admitting: Cardiology

## 2020-04-21 DIAGNOSIS — R600 Localized edema: Secondary | ICD-10-CM

## 2020-04-21 DIAGNOSIS — R609 Edema, unspecified: Secondary | ICD-10-CM

## 2020-04-21 DIAGNOSIS — I5020 Unspecified systolic (congestive) heart failure: Secondary | ICD-10-CM

## 2020-04-21 NOTE — Telephone Encounter (Signed)
Spoke with pt and daughter, patient has swelling in both feet R>L. His weight is usually 164 lbs and at the PCP office yesterday it was 169.8lb. he can not really tell me if he is SOB but does admit to gradual increase in his breathing over the last several days. He denies orthopnea and can lay flat but is restless in his sleep. He reports feeling as if in a dream while sleeping but his wife reports abnormal breathing with sleep. They have also noticed increased confusion over the last week or so. The patient has not taken his furosemide today and was told by his PCP to double the dose sat/sun/mon/tues. Advised the patient to take the furosemide 40 mg now and to be aware to be up pretty late going to the bathroom. The patient will take 40 mg of lasix today, sat, and sun. He will come to the office Monday for lab work to check bmp and bnp per the family request. Follow up appointment with the app scheduled for Thursday next week. Over 30 min spent on the phone with family answering questions and discussing symptoms.

## 2020-04-21 NOTE — Telephone Encounter (Signed)
   Pt's daughter Adrian Pace calling, she said pt is showing sign of CHF and would like to know if they can see DR. Crenshaw as soon as possible, provide dates 07/21 with APP, however, she said pt can't wait that long, she said to call pt and if she can be conference call with him

## 2020-04-22 ENCOUNTER — Other Ambulatory Visit: Payer: Self-pay

## 2020-04-22 ENCOUNTER — Emergency Department (HOSPITAL_COMMUNITY): Payer: Medicare PPO

## 2020-04-22 ENCOUNTER — Encounter (HOSPITAL_COMMUNITY): Payer: Self-pay | Admitting: Emergency Medicine

## 2020-04-22 ENCOUNTER — Emergency Department (HOSPITAL_COMMUNITY)
Admission: EM | Admit: 2020-04-22 | Discharge: 2020-04-22 | Disposition: A | Discharge status: Home / Self Care | Payer: Medicare PPO | Attending: Emergency Medicine | Admitting: Emergency Medicine

## 2020-04-22 DIAGNOSIS — Z7982 Long term (current) use of aspirin: Secondary | ICD-10-CM | POA: Diagnosis not present

## 2020-04-22 DIAGNOSIS — R609 Edema, unspecified: Secondary | ICD-10-CM

## 2020-04-22 DIAGNOSIS — Z950 Presence of cardiac pacemaker: Secondary | ICD-10-CM | POA: Insufficient documentation

## 2020-04-22 DIAGNOSIS — Z79899 Other long term (current) drug therapy: Secondary | ICD-10-CM | POA: Insufficient documentation

## 2020-04-22 DIAGNOSIS — N183 Chronic kidney disease, stage 3 unspecified: Secondary | ICD-10-CM | POA: Insufficient documentation

## 2020-04-22 DIAGNOSIS — I2510 Atherosclerotic heart disease of native coronary artery without angina pectoris: Secondary | ICD-10-CM | POA: Diagnosis not present

## 2020-04-22 DIAGNOSIS — I130 Hypertensive heart and chronic kidney disease with heart failure and stage 1 through stage 4 chronic kidney disease, or unspecified chronic kidney disease: Secondary | ICD-10-CM | POA: Diagnosis not present

## 2020-04-22 DIAGNOSIS — R2243 Localized swelling, mass and lump, lower limb, bilateral: Secondary | ICD-10-CM | POA: Diagnosis present

## 2020-04-22 DIAGNOSIS — I509 Heart failure, unspecified: Secondary | ICD-10-CM | POA: Insufficient documentation

## 2020-04-22 LAB — BRAIN NATRIURETIC PEPTIDE: B Natriuretic Peptide: 3072.2 pg/mL — ABNORMAL HIGH (ref 0.0–100.0)

## 2020-04-22 LAB — COMPREHENSIVE METABOLIC PANEL
ALT: 23 U/L (ref 0–44)
AST: 31 U/L (ref 15–41)
Albumin: 3.3 g/dL — ABNORMAL LOW (ref 3.5–5.0)
Alkaline Phosphatase: 98 U/L (ref 38–126)
Anion gap: 11 (ref 5–15)
BUN: 24 mg/dL — ABNORMAL HIGH (ref 8–23)
CO2: 28 mmol/L (ref 22–32)
Calcium: 8.6 mg/dL — ABNORMAL LOW (ref 8.9–10.3)
Chloride: 100 mmol/L (ref 98–111)
Creatinine, Ser: 1.2 mg/dL (ref 0.61–1.24)
GFR calc Af Amer: >60 mL/min (ref >60)
GFR calc non Af Amer: 55 mL/min — ABNORMAL LOW (ref >60)
Glucose, Bld: 102 mg/dL — ABNORMAL HIGH (ref 70–99)
Potassium: 3.6 mmol/L (ref 3.5–5.1)
Sodium: 139 mmol/L (ref 135–145)
Total Bilirubin: 1.2 mg/dL (ref 0.3–1.2)
Total Protein: 6.6 g/dL (ref 6.5–8.1)

## 2020-04-22 LAB — CBC
HCT: 41.2 % (ref 39.0–52.0)
Hemoglobin: 13 g/dL (ref 13.0–17.0)
MCH: 27 pg (ref 26.0–34.0)
MCHC: 31.6 g/dL (ref 30.0–36.0)
MCV: 85.7 fL (ref 80.0–100.0)
Platelets: 130 10*3/uL — ABNORMAL LOW (ref 150–400)
RBC: 4.81 MIL/uL (ref 4.22–5.81)
RDW: 16.7 % — ABNORMAL HIGH (ref 11.5–15.5)
WBC: 5.5 10*3/uL (ref 4.0–10.5)
nRBC: 0 % (ref 0.0–0.2)

## 2020-04-22 LAB — TROPONIN I (HIGH SENSITIVITY)
Troponin I (High Sensitivity): 19 ng/L — ABNORMAL HIGH (ref <18)
Troponin I (High Sensitivity): 18 ng/L — ABNORMAL HIGH (ref <18)

## 2020-04-22 LAB — MAGNESIUM: Magnesium: 2.2 mg/dL (ref 1.7–2.4)

## 2020-04-22 MED ORDER — SODIUM CHLORIDE 0.9% FLUSH: 3.0000 mL | Freq: Once | INTRAVENOUS | Status: DC

## 2020-04-22 MED ORDER — POTASSIUM CHLORIDE CRYS ER 20 MEQ PO TBCR
40.0000 meq | EXTENDED_RELEASE_TABLET | Freq: Once | ORAL | Status: AC
  Administered 2020-04-22: 40 meq via ORAL
  Filled 2020-04-22: qty 2

## 2020-04-22 NOTE — ED Triage Notes (Signed)
The patient presents with bilateral leg edema which started Monday on the right side but has worsened since yesterday. Yesterday, he had a change in his medication dose for CHF. Since then the family has noticed a warm, swollen right leg and right knee pain. Today, they noticed swelling in the left leg. Since he has had these changes, the family brought him in on there Cardiologists recommendations.

## 2020-04-22 NOTE — ED Provider Notes (Signed)
[facility] DEPT Provider Note   CSN: [id number] Arrival date & time: 04/22/20  1332     History Chief Complaint  Patient presents with  . Knee Pain  . Leg Swelling    Adrian Pace is a 86 y.o. male.  Presents to ER with concern for leg swelling.  About a week ago or so patient noted some swelling in his lower legs, worse on his right leg.  Cardiologist ordered outpatient ultrasound that was negative for DVT.  Symptoms seem to have been worsening over the last few days.  Per patient's primary doctor or increase his Lasix dose to 40 mg daily, daughter reports first extra dose was taken yesterday.  Per chart review CAD, heart failure with reduced ejection fraction, hypertension, hyperlipidemia, s/p ICD placement  HPI     Past Medical History:  Diagnosis Date  . Anxiety    Takes Ativan as needed  . Cardiac dysrhythmia, unspecified   . CHF (congestive heart failure) ([place])   . Coronary artery disease    Hx of cardiac caths   . Enlarged prostate   . GERD (gastroesophageal reflux disease)    Takes prilosec  . Gout   . Heart attack ([place])   . HTN (hypertension)   . Hyperkalemia   . Hyperlipemia   . Ischemic cardiomyopathy   . LV dysfunction   . Myocardial infarct ([place]) 1988  . Panic attacks   . Personal history of peptic ulcer disease   . Renal insufficiency   . Syncope and collapse   . Ventricular tachycardia [facility])     Patient Active Problem List   Diagnosis Date Noted  . Chest pain 02/20/2019  . Dizziness 02/20/2019  . Thrombocytopenia ([place]) 02/20/2019  . Meningioma ([place]) 02/20/2019  . Ventricular tachycardia ([place])   . Personal history of peptic ulcer disease   . Non-ST elevation (NSTEMI) myocardial infarction ([place])   . GERD (gastroesophageal reflux disease)   . Enlarged prostate   . CHF (congestive heart failure) ([place])   . Bruit 07/19/2013  . Automatic implantable cardioverter-defibrillator in situ 11/08/2011  . Umbilical  hernia 03/54/6568  . CAD S/P percutaneous coronary angioplasty 07/09/2011  . Ischemic cardiomyopathy   . CRI (chronic renal insufficiency), stage 3 (moderate)   . Dyslipidemia, goal LDL below 70   . Anxiety   . Panic attacks   . Essential hypertension 04/11/2009  . VENTRICULAR TACHYCARDIA 04/11/2009  . CHF 04/11/2009  . SYNCOPE 04/11/2009    Past Surgical History:  Procedure Laterality Date  . CARDIAC CATHETERIZATION  2003  . CARDIAC DEFIBRILLATOR PLACEMENT    . COLONOSCOPY    . CORONARY ANGIOPLASTY    . CORONARY STENT INTERVENTION N/A 02/22/2019   Procedure: CORONARY STENT INTERVENTION;  Surgeon: Jettie Booze, MD;  Location: [facility] LAB;  Service: Cardiovascular;  Laterality: N/A;  . ESOPHAGOGASTRODUODENOSCOPY     with biopsy  . HERNIA REPAIR  01/01/2012  . INSERT / REPLACE / REMOVE PACEMAKER  2005   ICD; replaced in Jan 2013  . LEAD REVISION N/A 11/08/2011   Procedure: LEAD REVISION;  Surgeon: Thompson Grayer, MD;  Location: [facility] C[facility] LAB;  Service: Cardiovascular;  Laterality: N/A;  . LEFT HEART CATH AND CORONARY ANGIOGRAPHY N/A 02/22/2019   Procedure: LEFT HEART CATH AND CORONARY ANGIOGRAPHY;  Surgeon: Jettie Booze, MD;  Location: [facility] CV LAB;  Service: Cardiovascular;  Laterality: N/A;  . UMBILICAL HERNIA REPAIR    . UMBILICAL HERNIA REPAIR  01/01/2012  Procedure: HERNIA REPAIR UMBILICAL ADULT;  Surgeon: Merrie Roof, MD;  Location: [facility];  Service: General;  Laterality: N/A;  umbilical hernia repair with mesh       Family History  Problem Relation Age of Onset  . Heart attack Father        x2  . Heart disease Father   . Cancer Father        bladder and prostate  . Anesthesia problems Neg Hx   . Hypotension Neg Hx   . Malignant hyperthermia Neg Hx   . Pseudochol deficiency Neg Hx     Social History   Tobacco Use  . Smoking status: Never Smoker  . Smokeless tobacco: Never Used  Substance Use Topics  . Alcohol use: Yes    Comment:  occassionally  . Drug use: No    Home Medications Prior to Admission medications   Medication Sig Start Date End Date Taking? Authorizing Provider  aspirin EC 81 MG tablet Take 81 mg by mouth daily. Swallow whole.   Yes [provider]  benazepril (LOTENSIN) 10 MG tablet TAKE ONE TABLET BY MOUTH DAILY 05/17/19  Yes Kilroy, Luke K, PA-C  carvedilol (COREG) 12.5 MG tablet Take 1 tablet (12.5 mg total) by mouth 2 (two) times daily with a meal. 01/18/20  Yes Crenshaw, Denice Bors, MD  escitalopram (LEXAPRO) 10 MG tablet Take 10 mg by mouth daily.   Yes [provider]  finasteride (PROSCAR) 5 MG tablet Take 5 mg by mouth daily.   Yes [provider]  furosemide (LASIX) 20 MG tablet Take 1 tablet (20 mg total) by mouth daily. Patient taking differently: Take 20 mg by mouth 2 (two) times daily.  03/23/20  Yes Cleaver, Jossie Ng, NP  LORazepam (ATIVAN) 0.5 MG tablet Take 0.5 mg by mouth 2 (two) times daily as needed for anxiety.  12/08/13  Yes [provider]  simvastatin (ZOCOR) 40 MG tablet TAKE ONE TABLET BY MOUTH DAILY AT 6PM Patient taking differently: Take 40 mg by mouth every evening.  03/30/20  Yes Deberah Pelton, NP  aspirin 81 MG chewable tablet Chew 1 tablet (81 mg total) by mouth daily for 30 days. Patient taking differently: Chew 81 mg by mouth daily.  02/24/19 07/06/19  Elodia Florence., MD  zolpidem (AMBIEN) 10 MG tablet Take 10 mg by mouth at bedtime as needed for sleep.     [provider]    Allergies    Entresto [sacubitril-valsartan], Niacin and related, and Codeine  Review of Systems   Review of Systems  Constitutional: Negative for chills and fever.  HENT: Negative for ear pain and sore throat.   Eyes: Negative for pain and visual disturbance.  Respiratory: Negative for cough and shortness of breath.   Cardiovascular: Negative for chest pain and palpitations.  Gastrointestinal: Negative for abdominal pain and vomiting.   Genitourinary: Negative for dysuria and hematuria.  Musculoskeletal: Positive for arthralgias. Negative for back pain.  Skin: Negative for color change and rash.  Neurological: Negative for seizures and syncope.  All other systems reviewed and are negative.   Physical Exam Updated Vital Signs BP 130/87   Pulse 75   Temp 97.7 F (36.5 C) (Oral)   Resp (!) 21   Ht 5\' 7"  (1.702 m)   Wt 76.7 kg   SpO2 98%   BMI 26.47 kg/m   Physical Exam Vitals and nursing note reviewed.  Constitutional:      Appearance: He is well-developed.  HENT:     Head: Normocephalic and atraumatic.  Eyes:     Conjunctiva/sclera: Conjunctivae normal.  Cardiovascular:     Rate and Rhythm: Normal rate and regular rhythm.     Heart sounds: No murmur heard.   Pulmonary:     Effort: Pulmonary effort is normal. No respiratory distress.     Breath sounds: Normal breath sounds.  Abdominal:     Palpations: Abdomen is soft.     Tenderness: There is no abdominal tenderness.  Musculoskeletal:     Cervical back: Neck supple.     Comments: Bilateral lower leg pitting edema to level of mid calf, no significant erythema noted, normal DP/PT pulses bilaterally  Skin:    General: Skin is warm and dry.     Capillary Refill: Capillary refill takes less than 2 seconds.  Neurological:     General: No focal deficit present.     Mental Status: He is alert.  Psychiatric:        Mood and Affect: Mood normal.        Behavior: Behavior normal.     ED Results / Procedures / Treatments   Labs (all labs ordered are listed, but only abnormal results are displayed) Labs Reviewed  CBC - Abnormal; Notable for the following components:      Result Value   RDW 16.7 (*)    Platelets 130 (*)    All other components within normal limits  BRAIN NATRIURETIC PEPTIDE - Abnormal; Notable for the following components:   B Natriuretic Peptide 3,072.2 (*)    All other components within normal limits  COMPREHENSIVE METABOLIC PANEL  - Abnormal; Notable for the following components:   Glucose, Bld 102 (*)    BUN 24 (*)    Calcium 8.6 (*)    Albumin 3.3 (*)    GFR calc non Af Amer 55 (*)    All other components within normal limits  TROPONIN I (HIGH SENSITIVITY) - Abnormal; Notable for the following components:   Troponin I (High Sensitivity) 19 (*)    All other components within normal limits  TROPONIN I (HIGH SENSITIVITY) - Abnormal; Notable for the following components:   Troponin I (High Sensitivity) 18 (*)    All other components within normal limits  MAGNESIUM    EKG EKG Interpretation  Date/Time:  Saturday April 22 2020 17:52:43 EDT Ventricular Rate:  72 PR Interval:    QRS Duration: 136 QT Interval:  451 QTC Calculation: 494 R Axis:   -57 Text Interpretation: Sinus rhythm Multiple premature complexes, vent & supraven IVCD, consider atypical RBBB Probable anterior infarct, old Nonspecific T abnormalities, lateral leads Confirmed by Orpah Greek [phone]) on 04/23/2020 10:44:34 AM   Radiology DG Chest 2 View  Result Date: 04/22/2020 CLINICAL DATA:  Bilateral leg swelling. EXAM: CHEST - 2 VIEW COMPARISON:  02/20/2019 FINDINGS: Stable enlarged cardiac silhouette and tortuous and calcified thoracic aorta. Stable left subclavian AICD leads. Interval minimal patchy opacity at both lung bases and minimal bilateral pleural effusions. Normal vasculature without evidence of interstitial edema. Thoracic spine degenerative changes. IMPRESSION: 1. Interval minimal bibasilar atelectasis or pneumonia and minimal bilateral pleural effusions. 2. Stable cardiomegaly. Electronically Signed   By: Claudie Revering M.D.   On: 04/22/2020 17:45    Procedures Procedures (including critical care time)  Medications Ordered in ED Medications  potassium chloride SA (KLOR-CON) CR tablet 40 mEq (40 mEq Oral Given 04/22/20 2105)    ED Course  I have reviewed the triage vital signs  and the nursing notes.  Pertinent labs &  imaging results that were available during my care of the patient were reviewed by me and considered in my medical decision making (see chart for details).    MDM Rules/Calculators/A&P                          86 year old male presents to ER with concern for lower leg swelling.  Extensive past medical history notable for CAD, CHF.  Over the past few weeks patient has had increased lower leg edema, has been followed closely by cardiology.  On exam patient has noted to have moderate pitting edema in his bilateral lower legs extending to mid calf.  Suspect this is related to his heart failure.  EKG without acute ischemic change, high-sensitivity troponin at upper limit of normal, repeat troponin stable, no chest pain, doubt ACS.  BNP is elevated however I have no comparison, suspect he chronically has significant elevation in his BNP.  Creatinine improved from prior.  Patient has no dyspnea, no hypoxia, no tachypnea.  Appears well overall.  His cardiologist had just increased yesterday dose of Lasix to 40 mg daily from 20 mg daily.  Based on current exam and appearance today, believe he is appropriate for continued outpatient management.  Patient has appointment with cardiologist on Thursday, PCP appointment on Wednesday.  For now, recommend that patient tentatively continue this higher dose of Lasix until he is reevaluated by his primary doctor and cardiologist.  Recommend that he call his cardiology office Monday morning to further discuss.  His cardiologist had ordered outpatient labs to be completed early this coming week, recommend that he have these obtained so that they are able to monitor his electrolytes and trend.  Reviewed return precautions in detail with patient and daughter.  Will discharge home.    After the discussed management above, the patient was determined to be safe for discharge.  The patient was in agreement with this plan and all questions regarding their care were answered.  ED  return precautions were discussed and the patient will return to the ED with any significant worsening of condition.  Final Clinical Impression(s) / ED Diagnoses Final diagnoses:  Acute on chronic heart failure, unspecified heart failure type ([place])  Peripheral edema    Rx / DC Orders ED Discharge Orders    None       Lucrezia Starch, MD 04/23/20 1316

## 2020-04-22 NOTE — Discharge Instructions (Addendum)
Tentatively, plan to continue taking the increased dose of the furosemide (Lasix) for the next few days until you are seen in the cardiology clinic on Thursday.  I would recommend that you contact your office on Monday to discuss current dosing and have your blood work rechecked on Monday as was originally planned.  Additionally recommend follow-up with your primary doctor.  Additionally recommend elevating legs when possible, utilizing compression socks when possible.  In the interim, if he develops worsening swelling, redness, fever, chest pain or difficulty in breathing, please return to ER for reassessment.

## 2020-04-22 NOTE — ED Notes (Signed)
Pt to Xray.

## 2020-04-25 ENCOUNTER — Telehealth: Payer: Self-pay | Admitting: *Deleted

## 2020-04-25 ENCOUNTER — Other Ambulatory Visit: Payer: Self-pay | Admitting: *Deleted

## 2020-04-25 DIAGNOSIS — R609 Edema, unspecified: Secondary | ICD-10-CM

## 2020-04-25 DIAGNOSIS — Z79899 Other long term (current) drug therapy: Secondary | ICD-10-CM

## 2020-04-25 LAB — BASIC METABOLIC PANEL
BUN/Creatinine Ratio: 15 (ref 10–24)
BUN: 20 mg/dL (ref 8–27)
CO2: 27 mmol/L (ref 20–29)
Calcium: 8.8 mg/dL (ref 8.6–10.2)
Chloride: 100 mmol/L (ref 96–106)
Creatinine, Ser: 1.32 mg/dL — ABNORMAL HIGH (ref 0.76–1.27)
GFR calc Af Amer: 56 mL/min/{1.73_m2} — ABNORMAL LOW (ref >59)
GFR calc non Af Amer: 49 mL/min/{1.73_m2} — ABNORMAL LOW (ref >59)
Glucose: 99 mg/dL (ref 65–99)
Potassium: 4.7 mmol/L (ref 3.5–5.2)
Sodium: 141 mmol/L (ref 134–144)

## 2020-04-25 LAB — PRO B NATRIURETIC PEPTIDE: NT-Pro BNP: 17077 pg/mL — ABNORMAL HIGH (ref 0–486)

## 2020-04-25 MED ORDER — FUROSEMIDE 20 MG PO TABS
60.0000 mg | ORAL_TABLET | Freq: Every day | ORAL | 1 refills | Status: DC
Start: 2020-04-25 — End: 2020-07-28

## 2020-04-25 NOTE — Telephone Encounter (Signed)
I spoke with patient. Swelling has not improved much.  Right leg more swollen than left.  He will increase furosemide to 60 mg daily and come to office for BMP on July 6.  Will send new prescription to [facility] on [address].  Patient will follow up with Fabian Sharp, PA as planned on July 1

## 2020-04-25 NOTE — Telephone Encounter (Signed)
Ithiel is returning Debra's call.

## 2020-04-25 NOTE — Telephone Encounter (Addendum)
-----   Message from Lelon Perla, MD sent at 04/25/2020  7:04 AM EDT ----- Please contact patient to see if edema is improving.  If not increase Lasix to 60 mg daily with bmet in 1 week.  If it is improving continue Lasix 40 mg daily. Kirk Ruths  Left message for pt to call

## 2020-04-26 NOTE — Progress Notes (Signed)
Cardiology Office Note:    Date:  04/27/2020   ID:  Adrian Pace, DOB Nov 08, 1932, MRN [id number]  PCP:  Kelton Pillar, MD  Cardiologist:  Kirk Ruths, MD   Referring MD: Kelton Pillar, MD   Chief Complaint  Patient presents with   Follow-up    CHF, lower extremity swelling    History of Present Illness:    Adrian Pace is a 86 y.o. male with a hx of ischemic cardiomyopathy, chronic systolic heart failure with recent EF of 20 to 25%, CAD with PCI to LAD and RCA in 2003, hypertension, and hyperlipidemia.  He has ICD in place.  Repeat cardiac catheterization in 01/2019 showed patent LAD stent, 50% distal LAD disease, 95% distal RCA treated with DES.  He completed 12 months of dual antiplatelet therapy.  With Dr. Stanford Breed in March of this year, he was not requiring daily diuretic.  However in follow-up with Coletta Memos, NP, he noted lower extremity swelling and was placed on alternating doses of 20 mg and 40 mg of Lasix.  Continues to have lower extremity swelling and lower extremity Dopplers were negative for DVT.  He was seen in the ER on 04/22/2020 for worsening lower extremity swelling.  He was evaluated and found to have elevated BNP to over 3000 in the setting of serum creatinine of 1.20.  Albumin 3.3. HS troponin low and flat.  He was not dyspneic and denied orthopnea.  He was discharged without admission and with close cardiology follow-up.  He presents today for follow-up.  He is taking 60 mg lasix daily with some improvement in right lower extremity edema. Swelling has reduced in his leg, but still prominent in his foot, although less so and with less pain.  No dyspnea, orthopnea, and DOE. He does weigh daily and weighed 155 lbs this morning, has been as high as 169 in the ER. Unclear what his dry weight is - he will continue daily weights. No ICD fires.   Past Medical History:  Diagnosis Date   Anxiety    Takes Ativan as needed   Cardiac dysrhythmia, unspecified     CHF (congestive heart failure) (HCC)    Coronary artery disease    Hx of cardiac caths    Enlarged prostate    GERD (gastroesophageal reflux disease)    Takes prilosec   Gout    Heart attack ([place])    HTN (hypertension)    Hyperkalemia    Hyperlipemia    Ischemic cardiomyopathy    LV dysfunction    Myocardial infarct ([place]) 1988   Panic attacks    Personal history of peptic ulcer disease    Renal insufficiency    Syncope and collapse    Ventricular tachycardia ([place])     Past Surgical History:  Procedure Laterality Date   CARDIAC CATHETERIZATION  2003   CARDIAC DEFIBRILLATOR PLACEMENT     COLONOSCOPY     CORONARY ANGIOPLASTY     CORONARY STENT INTERVENTION N/A 02/22/2019   Procedure: CORONARY STENT INTERVENTION;  Surgeon: Jettie Booze, MD;  Location: [facility];  Service: Cardiovascular;  Laterality: N/A;   ESOPHAGOGASTRODUODENOSCOPY     with biopsy   HERNIA REPAIR  01/01/2012   INSERT / REPLACE / REMOVE PACEMAKER  2005   ICD; replaced in Jan 2013   LEAD REVISION N/A 11/08/2011   Procedure: LEAD REVISION;  Surgeon: Thompson Grayer, MD;  Location: [facility] CATH LAB;  Service: Cardiovascular;  Laterality: N/A;   LEFT HEART CATH  AND CORONARY ANGIOGRAPHY N/A 02/22/2019   Procedure: LEFT HEART CATH AND CORONARY ANGIOGRAPHY;  Surgeon: Jettie Booze, MD;  Location: [facility];  Service: Cardiovascular;  Laterality: N/A;   UMBILICAL HERNIA REPAIR     UMBILICAL HERNIA REPAIR  01/01/2012   Procedure: HERNIA REPAIR UMBILICAL ADULT;  Surgeon: Merrie Roof, MD;  Location: [facility];  Service: General;  Laterality: N/A;  umbilical hernia repair with mesh    Current Medications: Current Meds  Medication Sig   aspirin EC 81 MG tablet Take 81 mg by mouth daily. Swallow whole.   benazepril (LOTENSIN) 10 MG tablet TAKE ONE TABLET BY MOUTH DAILY   carvedilol (COREG) 12.5 MG tablet Take 1 tablet (12.5 mg total) by mouth 2 (two) times daily  with a meal.   eszopiclone (LUNESTA) 2 MG TABS tablet Take 2 mg by mouth daily. Take immediately before bedtime   finasteride (PROSCAR) 5 MG tablet Take 5 mg by mouth daily.   furosemide (LASIX) 20 MG tablet Take 3 tablets (60 mg total) by mouth daily.   simvastatin (ZOCOR) 40 MG tablet TAKE ONE TABLET BY MOUTH DAILY AT 6PM (Patient taking differently: Take 40 mg by mouth every evening. )     Allergies:   Entresto [sacubitril-valsartan], Niacin and related, and Codeine   Social History   Socioeconomic History   Marital status: Married    Spouse name: Not on file   Number of children: Not on file   Years of education: Not on file   Highest education level: Not on file  Occupational History   Not on file  Tobacco Use   Smoking status: Never Smoker   Smokeless tobacco: Never Used  Substance and Sexual Activity   Alcohol use: Yes    Comment: occassionally   Drug use: No   Sexual activity: Not Currently  Other Topics Concern   Not on file  Social History Narrative   Not on file   Social Determinants of Health   Financial Resource Strain:    Difficulty of Paying Living Expenses:   Food Insecurity:    Worried About Charity fundraiser in the Last Year:    Arboriculturist in the Last Year:   Transportation Needs:    Film/video editor (Medical):    Lack of Transportation (Non-Medical):   Physical Activity:    Days of Exercise per Week:    Minutes of Exercise per Session:   Stress:    Feeling of Stress :   Social Connections:    Frequency of Communication with Friends and Family:    Frequency of Social Gatherings with Friends and Family:    Attends Religious Services:    Active Member of Clubs or Organizations:    Attends Archivist Meetings:    Marital Status:      Family History: The patient's family history includes Cancer in his father; Heart attack in his father; Heart disease in his father. There is no history of  Anesthesia problems, Hypotension, Malignant hyperthermia, or Pseudochol deficiency.  ROS:   Please see the history of present illness.     All other systems reviewed and are negative.  EKGs/Labs/Other Studies Reviewed:    The following studies were reviewed today:  Left heart cath 02/22/19:  Patent LAD stent.  Mid LAD-2 lesion is 30% stenosed.  Dist LAD lesion is 50% stenosed.  Mid RCA lesion is 30% stenosed.  Dist RCA-1 lesion is 95% stenosed.  A drug-eluting stent  was successfully placed using a STENT SYNERGY DES 3X20.  Post intervention, there is a 0% residual stenosis.  LV end diastolic pressure is normal.  There is no aortic valve stenosis.  Would not use right radial approach in teh future given subclavian tortuosity.   Echo 02/21/19: 1. Severe akinesis of the left ventricular, apical inferior wall.  2. The left ventricle has severely reduced systolic function, with an  ejection fraction of 20-25%. Left ventricular diastolic Doppler parameters  are consistent with impaired relaxation. Left ventricular diffuse  hypokinesis.  3. Left atrial size was mildly dilated.  4. Mitral valve regurgitation is moderate by color flow Doppler.  5. Aortic valve regurgitation is mild by color flow Doppler.  6. The aortic root is normal in size and structure.  7. No intracardiac thrombi or masses were visualized.   EKG:  EKG is not ordered today.    Recent Labs: 04/22/2020: ALT 23; B Natriuretic Peptide 3,072.2; Hemoglobin 13.0; Magnesium 2.2; Platelets 130 04/24/2020: BUN 20; Creatinine, Ser 1.32; NT-Pro BNP 17,077; Potassium 4.7; Sodium 141  Recent Lipid Panel    Component Value Date/Time   CHOL 97 (L) 01/03/2020 1126   TRIG 87 01/03/2020 1126   HDL 32 (L) 01/03/2020 1126   CHOLHDL 3.0 01/03/2020 1126   CHOLHDL 3.4 02/21/2019 0226   VLDL 17 02/21/2019 0226   LDLCALC 48 01/03/2020 1126    Physical Exam:    VS:  BP 104/62    Pulse 69    Ht 5\' 7"  (1.702 m)    Wt  159 lb 9.6 oz (72.4 kg)    SpO2 94%    BMI 25.00 kg/m     Wt Readings from Last 3 Encounters:  04/27/20 159 lb 9.6 oz (72.4 kg)  04/22/20 169 lb (76.7 kg)  02/24/20 167 lb 3.2 oz (75.8 kg)     GEN: elderly male in no acute distress HEENT: Normal NECK: No JVD; No carotid bruits LYMPHATICS: No lymphadenopathy CARDIAC: RRR, no murmurs, rubs, gallops RESPIRATORY:  Clear to auscultation without rales, wheezing or rhonchi  ABDOMEN: Soft, non-tender, non-distended MUSCULOSKELETAL:  Right foot swelling greater than left, residual leg edema; No deformity  SKIN: Warm and dry NEUROLOGIC:  Alert and oriented x 3 PSYCHIATRIC:  Normal affect   ASSESSMENT:    1. Ischemic cardiomyopathy   2. Systolic congestive heart failure, unspecified HF chronicity ([place])   3. Dyslipidemia, goal LDL below 70   4. CAD S/P percutaneous coronary angioplasty   5. Non-ST elevation (NSTEMI) myocardial infarction ([place])   6. Essential hypertension   7. Bilateral lower extremity edema    PLAN:    In order of problems listed above:  Lower extremity swelling Chronic systolic heart failure Ischemic cardiomyopathy - Last echo in 2020 with EF of 20 to 25% - Maintained on carvedilol, benazepril, and 60 mg of furosemide daily - Recent duplex was negative for DVT of lower extremities - swelling is improving with 60 mg lasix - hx of hyperkalemia with entresto - will check BMP and BNP today - recommended 1500 mg sodium restriction - he will start using compression stockings again and continue elevating his feet - I would like him to have close follow up next week   CAD - Brilinta has been discontinued and he is maintained on aspirin 81 mg daily - no chest pain   Hypertension - Medications as above   Hyperlipidemia - continue statin  Follow up next week.    Medication Adjustments/Labs and Tests Ordered: Current medicines  are reviewed at length with the patient today.  Concerns regarding medicines are  outlined above.  Orders Placed This Encounter  Procedures   Basic metabolic panel   Brain natriuretic peptide   No orders of the defined types were placed in this encounter.   Signed, Ledora Bottcher, PA  04/27/2020 4:45 PM    [facility]

## 2020-04-27 ENCOUNTER — Other Ambulatory Visit: Payer: Self-pay

## 2020-04-27 ENCOUNTER — Encounter: Payer: Self-pay | Admitting: Physician Assistant

## 2020-04-27 ENCOUNTER — Ambulatory Visit (INDEPENDENT_AMBULATORY_CARE_PROVIDER_SITE_OTHER): Payer: Medicare PPO | Admitting: Physician Assistant

## 2020-04-27 VITALS — BP 104/62 | HR 69 | Ht 67.0 in | Wt 159.6 lb

## 2020-04-27 DIAGNOSIS — E785 Hyperlipidemia, unspecified: Secondary | ICD-10-CM

## 2020-04-27 DIAGNOSIS — Z9861 Coronary angioplasty status: Secondary | ICD-10-CM

## 2020-04-27 DIAGNOSIS — I5020 Unspecified systolic (congestive) heart failure: Secondary | ICD-10-CM | POA: Diagnosis not present

## 2020-04-27 DIAGNOSIS — R600 Localized edema: Secondary | ICD-10-CM

## 2020-04-27 DIAGNOSIS — I2510 Atherosclerotic heart disease of native coronary artery without angina pectoris: Secondary | ICD-10-CM | POA: Diagnosis not present

## 2020-04-27 DIAGNOSIS — I214 Non-ST elevation (NSTEMI) myocardial infarction: Secondary | ICD-10-CM

## 2020-04-27 DIAGNOSIS — I255 Ischemic cardiomyopathy: Secondary | ICD-10-CM

## 2020-04-27 DIAGNOSIS — I10 Essential (primary) hypertension: Secondary | ICD-10-CM

## 2020-04-27 NOTE — Patient Instructions (Signed)
Medication Instructions:  Your physician recommends that you continue on your current medications as directed. Please refer to the Current Medication list given to you today.  *If you need a refill on your cardiac medications before your next appointment, please call your pharmacy*   Lab Work: Your physician recommends that you return for lab work today: BMET, BNP  If you have labs (blood work) drawn today and your tests are completely normal, you will receive your results only by:  MyChart Message (if you have [place]) OR  A paper copy in the mail If you have any lab test that is abnormal or we need to change your treatment, we will call you to review the results.   Follow-Up: At [facility], you and your health needs are our priority.  As part of our continuing mission to provide you with exceptional heart care, we have created designated Provider Care Teams.  These Care Teams include your primary Cardiologist (physician) and Advanced Practice Providers (APPs -  Physician Assistants and Nurse Practitioners) who all work together to provide you with the care you need, when you need it.  We recommend signing up for the patient portal called "MyChart".  Sign up information is provided on this After Visit Summary.  MyChart is used to connect with patients for Virtual Visits (Telemedicine).  Patients are able to view lab/test results, encounter notes, upcoming appointments, etc.  Non-urgent messages can be sent to your provider as well.   To learn more about what you can do with MyChart, go to [email or web address].    Your next appointment:   Wednesday, 05/03/20 at 10:45 AM  The format for your next appointment:   In Person  Provider:   Rosaria Ferries, PA-C

## 2020-04-28 ENCOUNTER — Other Ambulatory Visit: Payer: Self-pay

## 2020-04-28 ENCOUNTER — Encounter: Payer: Self-pay | Admitting: Physician Assistant

## 2020-04-28 ENCOUNTER — Ambulatory Visit (INDEPENDENT_AMBULATORY_CARE_PROVIDER_SITE_OTHER): Payer: Medicare PPO | Admitting: Physician Assistant

## 2020-04-28 VITALS — BP 100/62 | HR 65 | Temp 97.0°F | Ht 65.0 in | Wt 158.2 lb

## 2020-04-28 DIAGNOSIS — I255 Ischemic cardiomyopathy: Secondary | ICD-10-CM | POA: Diagnosis not present

## 2020-04-28 DIAGNOSIS — R600 Localized edema: Secondary | ICD-10-CM

## 2020-04-28 DIAGNOSIS — M79671 Pain in right foot: Secondary | ICD-10-CM

## 2020-04-28 DIAGNOSIS — I2510 Atherosclerotic heart disease of native coronary artery without angina pectoris: Secondary | ICD-10-CM

## 2020-04-28 DIAGNOSIS — E785 Hyperlipidemia, unspecified: Secondary | ICD-10-CM

## 2020-04-28 DIAGNOSIS — Z95810 Presence of automatic (implantable) cardiac defibrillator: Secondary | ICD-10-CM | POA: Diagnosis not present

## 2020-04-28 DIAGNOSIS — I10 Essential (primary) hypertension: Secondary | ICD-10-CM

## 2020-04-28 LAB — BASIC METABOLIC PANEL
BUN/Creatinine Ratio: 16 (ref 10–24)
BUN: 20 mg/dL (ref 8–27)
CO2: 31 mmol/L — ABNORMAL HIGH (ref 20–29)
Calcium: 9.1 mg/dL (ref 8.6–10.2)
Chloride: 96 mmol/L (ref 96–106)
Creatinine, Ser: 1.28 mg/dL — ABNORMAL HIGH (ref 0.76–1.27)
GFR calc Af Amer: 59 mL/min/{1.73_m2} — ABNORMAL LOW (ref >59)
GFR calc non Af Amer: 51 mL/min/{1.73_m2} — ABNORMAL LOW (ref >59)
Glucose: 107 mg/dL — ABNORMAL HIGH (ref 65–99)
Potassium: 4.2 mmol/L (ref 3.5–5.2)
Sodium: 143 mmol/L (ref 134–144)

## 2020-04-28 LAB — BRAIN NATRIURETIC PEPTIDE: BNP: 1877 pg/mL — ABNORMAL HIGH (ref 0.0–100.0)

## 2020-04-28 MED ORDER — COLCHICINE 0.6 MG PO TABS
0.6000 mg | ORAL_TABLET | Freq: Two times a day (BID) | ORAL | 0 refills | Status: DC
Start: 2020-04-28 — End: 2020-05-30

## 2020-04-28 NOTE — Telephone Encounter (Signed)
I spoke with the patient today. The pain with walking is a new symptom this morning. I did not appreciate a cool extremity or erythema suggestive of cellulitis or arterial blockage when I saw him yesterday. In consultation with Dr. Stanford Breed and the patient, he will come for ABIs today at [facility] has graciously agreed to examine his foot this afternoon. If signs of ischemia, will need to go to the ER.

## 2020-04-28 NOTE — Telephone Encounter (Signed)
Spoke to pt and confirmed appointment at 3 pm with Almyra Deforest, PA.

## 2020-04-28 NOTE — Progress Notes (Addendum)
Cardiology Office Note   Date:  05/03/2020   ID:  Adrian Pace, DOB 1933/07/10, MRN [id number]  PCP:  Kelton Pillar, MD Cardiologist:  Kirk Ruths, MD 01/03/2020 Electrphysiologist: Cristopher Peru, MD Rosaria Ferries, PA-C  [place] 04/28/2020   History of Present Illness: Adrian Pace is a 86 y.o. male with a history of ICM w/ EF 20-25%, S-CHF, DES LAD & RCA 2003, HTN, HLD, VT s/p MDT Protecta ICD, MI April 2020 w/ patent LAD stent, 50% dLAD, 95% dRCA s/p DES.  Office visit 07/02, pt w/ ?gout>>colchicine, ABIs ordered, wt 158 lbs, BUN/Cr 28/1.28, continue Lasix 60 mg qd  Adrian Pace presents for cardiology follow up. He is here today with his daughter.  His breathing is better, but he is still not walking much. He denies orthopnea or PND. LE edema is better, still with some swelling R foot.   He has not had any chest pain, but activity is very poor.   He feels weak and has not been able to increase his activity due to recent foot pain and SOB. He realizes he needs to increase his activity but struggles with this.   The foot pain has improved. However, he has gotten diarrhea. No N&V.   His appetite is poor. He is not eating much. He has lost weight since his last visit.   No palpitations, no presyncope or syncope.   He was offered a referral to the CHF clinic at one point. At this time, he prefers to continue current care.   Past Medical History:  Diagnosis Date  . Anxiety    Takes Ativan as needed  . Cardiac dysrhythmia, unspecified   . CHF (congestive heart failure) ([place])   . Coronary artery disease    Hx of cardiac caths   . Enlarged prostate   . GERD (gastroesophageal reflux disease)    Takes prilosec  . Gout   . Heart attack ([place])   . HTN (hypertension)   . Hyperkalemia   . Hyperlipemia   . Ischemic cardiomyopathy   . LV dysfunction   . Myocardial infarct ([place]) 1988  . Panic attacks   . Personal history of peptic ulcer disease   .  Renal insufficiency   . Syncope and collapse   . Ventricular tachycardia [facility])     Past Surgical History:  Procedure Laterality Date  . CARDIAC CATHETERIZATION  2003  . CARDIAC DEFIBRILLATOR PLACEMENT    . COLONOSCOPY    . CORONARY ANGIOPLASTY    . CORONARY STENT INTERVENTION N/A 02/22/2019   Procedure: CORONARY STENT INTERVENTION;  Surgeon: Jettie Booze, MD;  Location: [facility];  Service: Cardiovascular;  Laterality: N/A;  . ESOPHAGOGASTRODUODENOSCOPY     with biopsy  . HERNIA REPAIR  01/01/2012  . INSERT / REPLACE / REMOVE PACEMAKER  2005   ICD; replaced in Jan 2013  . LEAD REVISION N/A 11/08/2011   Procedure: LEAD REVISION;  Surgeon: Thompson Grayer, MD;  Location: [facility] CATH LAB;  Service: Cardiovascular;  Laterality: N/A;  . LEFT HEART CATH AND CORONARY ANGIOGRAPHY N/A 02/22/2019   Procedure: LEFT HEART CATH AND CORONARY ANGIOGRAPHY;  Surgeon: Jettie Booze, MD;  Location: [facility];  Service: Cardiovascular;  Laterality: N/A;  . UMBILICAL HERNIA REPAIR    . UMBILICAL HERNIA REPAIR  01/01/2012   Procedure: HERNIA REPAIR UMBILICAL ADULT;  Surgeon: Merrie Roof, MD;  Location: [facility] [facility];  Service: General;  Laterality: N/A;  umbilical hernia  repair with mesh    Current Outpatient Medications  Medication Sig Dispense Refill  . aspirin EC 81 MG tablet Take 81 mg by mouth daily. Swallow whole.    . benazepril (LOTENSIN) 10 MG tablet TAKE ONE TABLET BY MOUTH DAILY 90 tablet 3  . carvedilol (COREG) 12.5 MG tablet Take 1 tablet (12.5 mg total) by mouth 2 (two) times daily with a meal. 180 tablet 3  . colchicine 0.6 MG tablet Take 1 tablet (0.6 mg total) by mouth 2 (two) times daily. 28 tablet 0  . eszopiclone (LUNESTA) 2 MG TABS tablet Take 2 mg by mouth daily. Take immediately before bedtime    . finasteride (PROSCAR) 5 MG tablet Take 5 mg by mouth daily.    . furosemide (LASIX) 20 MG tablet Take 3 tablets (60 mg total) by mouth daily. 270 tablet 1  .  simvastatin (ZOCOR) 40 MG tablet TAKE ONE TABLET BY MOUTH DAILY AT 6PM (Patient taking differently: Take 40 mg by mouth every evening. ) 90 tablet 1   No current facility-administered medications for this visit.    Allergies:   Entresto [sacubitril-valsartan], Niacin and related, and Codeine    Social History:  The patient  reports that he has never smoked. He has never used smokeless tobacco. He reports current alcohol use. He reports that he does not use drugs.   Family History:  The patient's family history includes Cancer in his father; Heart attack in his father; Heart disease in his father.  He indicated that his mother is deceased. He indicated that his father is deceased. He indicated that his maternal grandmother is deceased. He indicated that his maternal grandfather is deceased. He indicated that his paternal grandmother is deceased. He indicated that his paternal grandfather is deceased. He indicated that the status of his neg hx is unknown.   ROS:  Please see the history of present illness. All other systems are reviewed and negative.    PHYSICAL EXAM: VS:  BP 120/68   Pulse 67   Ht 5\' 6"  (1.676 m)   Wt 147 lb 6.4 oz (66.9 kg)   SpO2 93%   BMI 23.79 kg/m  , BMI Body mass index is 23.79 kg/m. GEN: Well nourished, frail, elderly, male in no acute distress HEENT: normal for age  Neck: no JVD, no carotid bruit, no masses Cardiac: RRR; no murmur, no rubs, or gallops Respiratory:  clear to auscultation bilaterally, normal work of breathing GI: soft, nontender, nondistended, + BS MS: no deformity or atrophy; no edema; distal pulses are 2+ in all 4 extremities  Skin: warm and dry, no rash Neuro:  Strength and sensation are intact Psych: euthymic mood, full affect   EKG:  EKG is not ordered today.  Echo 02/21/2019 1. Severe akinesis of the left ventricular, apical inferior wall.  2. The left ventricle has severely reduced systolic function, with an  ejection fraction of  20-25%. Left ventricular diastolic Doppler parameters  are consistent with impaired relaxation. Left ventricular diffuse  hypokinesis.  3. Left atrial size was mildly dilated.  4. Mitral valve regurgitation is moderate by color flow Doppler.  5. Aortic valve regurgitation is mild by color flow Doppler.  6. The aortic root is normal in size and structure.  7. No intracardiac thrombi or masses were visualized.    Cath 02/22/2019  Patent LAD stent.  Mid LAD-2 lesion is 30% stenosed.  Dist LAD lesion is 50% stenosed.  Mid RCA lesion is 30% stenosed.  Dist RCA-1  lesion is 95% stenosed.  A drug-eluting stent was successfully placed using a STENT SYNERGY DES 3X20.  Post intervention, there is a 0% residual stenosis.  LV end diastolic pressure is normal.  There is no aortic valve stenosis.  Would not use right radial approach in the future given subclavian tortuosity.   Recent Labs: 04/22/2020: ALT 23; Hemoglobin 13.0; Magnesium 2.2; Platelets 130 04/24/2020: NT-Pro BNP 17,077 04/27/2020: BNP 1,877.0 05/03/2020: BUN 18; Creatinine, Ser 1.27; Potassium 4.0; Sodium 141  CBC    Component Value Date/Time   WBC 5.5 04/22/2020 1744   RBC 4.81 04/22/2020 1744   HGB 13.0 04/22/2020 1744   HCT 41.2 04/22/2020 1744   PLT 130 (L) 04/22/2020 1744   MCV 85.7 04/22/2020 1744   MCH 27.0 04/22/2020 1744   MCHC 31.6 04/22/2020 1744   RDW 16.7 (H) 04/22/2020 1744   LYMPHSABS 0.1 (L) 08/26/2007 0922   MONOABS 0.5 08/26/2007 0922   EOSABS 0.1 08/26/2007 0922   BASOSABS 0.0 08/26/2007 0922   CMP Latest Ref Rng & Units 05/03/2020 04/27/2020 04/24/2020  Glucose 65 - 99 mg/dL 93 107(H) 99  BUN 8 - 27 mg/dL 18 20 20   Creatinine 0.76 - 1.27 mg/dL 1.27 1.28(H) 1.32(H)  Sodium 134 - 144 mmol/L 141 143 141  Potassium 3.5 - 5.2 mmol/L 4.0 4.2 4.7  Chloride 96 - 106 mmol/L 98 96 100  CO2 20 - 29 mmol/L 26 31(H) 27  Calcium 8.6 - 10.2 mg/dL 9.2 9.1 8.8  Total Protein 6.5 - 8.1 g/dL - - -  Total  Bilirubin 0.3 - 1.2 mg/dL - - -  Alkaline Phos 38 - 126 U/L - - -  AST 15 - 41 U/L - - -  ALT 0 - 44 U/L - - -    Lipid Panel Lab Results  Component Value Date   CHOL 97 (L) 01/03/2020   HDL 32 (L) 01/03/2020   LDLCALC 48 01/03/2020   TRIG 87 01/03/2020   CHOLHDL 3.0 01/03/2020      Wt Readings from Last 3 Encounters:  05/03/20 147 lb 6.4 oz (66.9 kg)  04/28/20 158 lb 3.2 oz (71.8 kg)  04/27/20 159 lb 9.6 oz (72.4 kg)     Other studies Reviewed: Additional studies/ records that were reviewed today include: Office notes, hospital records and testing.  ASSESSMENT AND PLAN:  1.  Chronic systolic CHF:  - His weight is down 11 lbs in less than a week - he does not appear extremely dry, but has had diarrhea - continue Lasix for now, but ck BMET - we discussed a referral to the CHF clinic. I offered this to him but explained that because he cannot take Entresto, and his BP limits med titration, I'm not sure how much they can do. He is feeling better in general and his weight is at baseline. He does not wish to have the referral right now.  2. Gout - colchicine started at recent office visit, but pt developed diarrhea - significant improvement in sx on this med - decrease colchicine to 0.6 mg qd and follow sx - if diarrhea does not resolve, d/c colchicine and use allopurinol - f/u with Dr Stanford Breed in August, may be able to change to allopurinol then if swelling and pain have completely resolved.   3. Deconditioning - pt has become very weak with decreased activity level from his volume overload and gout -has had PT before but not in several years - get PT eval, may need home rx  4. ICM, CAD:  - No ongoing ischemic sx - continue ASA, BB, statin  5. Weight loss, poor nutritional status - his po intake is poor, he has no appetite most of the time - add protein bars for snacks and f/u with PCP   Current medicines are reviewed at length with the patient today.  The patient  has concerns regarding medicines. Concerns were addressed.  The following changes have been made:  no change for now, until labs reviewed  Labs/ tests ordered today include:   Orders Placed This Encounter  Procedures  . Basic metabolic panel     Disposition:   FU with Kirk Ruths, MD  Signed, Rosaria Ferries, PA-C  05/03/2020 8:16 PM    [facility] Phone: [phone]; Fax: [phone]

## 2020-04-28 NOTE — Patient Instructions (Addendum)
Medication Instructions:   START Colchine 0.6 mg 2 times a day for 2 weeks *If you need a refill on your cardiac medications before your next appointment, please call your pharmacy*  Lab Work: NONE ordered at this time of appointment   If you have labs (blood work) drawn today and your tests are completely normal, you will receive your results only by: Marland Kitchen MyChart Message (if you have MyChart) OR . A paper copy in the mail If you have any lab test that is abnormal or we need to change your treatment, we will call you to review the results.  Testing/Procedures: NONE ordered at this time of appointment   Follow-Up: At [facility], you and your health needs are our priority.  As part of our continuing mission to provide you with exceptional heart care, we have created designated Provider Care Teams.  These Care Teams include your primary Cardiologist (physician) and Advanced Practice Providers (APPs -  Physician Assistants and Nurse Practitioners) who all work together to provide you with the care you need, when you need it.  We recommend signing up for the patient portal called "MyChart".  Sign up information is provided on this After Visit Summary.  MyChart is used to connect with patients for Virtual Visits (Telemedicine).  Patients are able to view lab/test results, encounter notes, upcoming appointments, etc.  Non-urgent messages can be sent to your provider as well.   To learn more about what you can do with MyChart, go to [email or web address].    Your next appointment:   Follow up as scheduled   The format for your next appointment:   In Person  Provider:   Rosaria Ferries, PA-C  Other Instructions  Keep your scheduled appointment for your ABIs  Continue to monitor the redness of your leg if it spreads upward give our office a call.

## 2020-04-28 NOTE — Progress Notes (Signed)
Cardiology Office Note:    Date:  04/30/2020   ID:  Adrian Pace, DOB [birth year], MRN [id number]  PCP:  Kelton Pillar, MD  [facility] Cardiologist:  Kirk Ruths, MD  [facility] Electrophysiologist:  Cristopher Peru, MD   Referring MD: Kelton Pillar, MD   Chief Complaint  Patient presents with  . Follow-up    seen for Dr. Stanford Breed    History of Present Illness:    ASHDEN Pace is a 86 y.o. male with a hx of HTN, HLD, VT, ICM s/p ICD, syncope, and CAD.  Patient underwent PCI to LAD and RCA in 2003.  EF was 30% the time.  Repeat echocardiogram in April 2020 showed EF 20 to 25%, mild LAE, mild AI, moderate MR.  Repeat cardiac catheterization April 2020 showed patent LAD stent, 50% distal LAD, 95% distal RCA treated with DES.  Carotid Doppler around the same time showed no significant carotid artery disease.  Patient was seen by Dr. Stanford Breed in March 2021 at which time he had occasional dyspnea however not related to increased activity.  He was evaluated by Coletta Memos, NP on 02/24/2020 for lower extremity edema.  Diuretic was adjusted with 40 mg 3 times weekly and the 20 mg on the other days.  Patient returned to the ED on 04/22/2020 with worsening lower extremity edema about a week despite increasing the Lasix to 40 mg daily.  Outpatient venous Doppler was negative.  proBNP was 17,077. Patient was seen yesterday by Fabian Sharp at the time, he was on 60 mg daily of Lasix with improvement in the lower extremity edema. BNP was elevated at 1877.   Patient was added on visit today for evaluation of right foot pain.  Otherwise, his weight is going down, he does not appear to be volume overloaded.  I recommended continue on the current dose of diuretic.  The location of pain is in the dorsal surface of the right foot.  See picture below.  It is across the entire upper surface of the foot near the tarsometatarsal joint.  Differential diagnosis include gout versus cellulitis.  On physical  exam, symptom is ipsilateral on the right side.  It feels warm and tender to touch.  He denies any recent fever or chill or cough.  I suspect this is more likely to be gout in the setting of recent aggressive diuresis.  Unfortunately given his history of heart failure, NSAID therapy is not a good choice due to its effect on the sodium and fluid retention.  Prednisone also will cause significant side effect as well.  Therefore I recommend start on colchicine 0.6 mg twice a day for 2 weeks.  He has follow-up with Rosaria Ferries next week.  He is aware that if the redness began to travel up the leg, then he will need to contact us earlier due to concern of possible cellulitis.  He has very strong dopplerable pulse in the left foot, he also has strong posterior tibial pulse in the right foot, he has weak dorsalis pedis pulse on the right side.  He has upcoming ABI next week, however I suspect ABI will likely to be normal.     Past Medical History:  Diagnosis Date  . Anxiety    Takes Ativan as needed  . Cardiac dysrhythmia, unspecified   . CHF (congestive heart failure) ([place])   . Coronary artery disease    Hx of cardiac caths   . Enlarged prostate   . GERD (gastroesophageal reflux  disease)    Takes prilosec  . Gout   . Heart attack ([place])   . HTN (hypertension)   . Hyperkalemia   . Hyperlipemia   . Ischemic cardiomyopathy   . LV dysfunction   . Myocardial infarct ([place]) 1988  . Panic attacks   . Personal history of peptic ulcer disease   . Renal insufficiency   . Syncope and collapse   . Ventricular tachycardia [facility])     Past Surgical History:  Procedure Laterality Date  . CARDIAC CATHETERIZATION  2003  . CARDIAC DEFIBRILLATOR PLACEMENT    . COLONOSCOPY    . CORONARY ANGIOPLASTY    . CORONARY STENT INTERVENTION N/A 02/22/2019   Procedure: CORONARY STENT INTERVENTION;  Surgeon: Jettie Booze, MD;  Location: [place] [facility];  Service: Cardiovascular;  Laterality: N/A;  .  ESOPHAGOGASTRODUODENOSCOPY     with biopsy  . HERNIA REPAIR  01/01/2012  . INSERT / REPLACE / REMOVE PACEMAKER  2005   ICD; replaced in Jan 2013  . LEAD REVISION N/A 11/08/2011   Procedure: LEAD REVISION;  Surgeon: Thompson Grayer, MD;  Location: [facility] CATH LAB;  Service: Cardiovascular;  Laterality: N/A;  . LEFT HEART CATH AND CORONARY ANGIOGRAPHY N/A 02/22/2019   Procedure: LEFT HEART CATH AND CORONARY ANGIOGRAPHY;  Surgeon: Jettie Booze, MD;  Location: [place] [facility];  Service: Cardiovascular;  Laterality: N/A;  . UMBILICAL HERNIA REPAIR    . UMBILICAL HERNIA REPAIR  01/01/2012   Procedure: HERNIA REPAIR UMBILICAL ADULT;  Surgeon: Merrie Roof, MD;  Location: [place];  Service: General;  Laterality: N/A;  umbilical hernia repair with mesh    Current Medications: No outpatient medications have been marked as taking for the 04/28/20 encounter (Office Visit) with Almyra Deforest, [place].     Allergies:   Entresto [sacubitril-valsartan], Niacin and related, and Codeine   Social History   Socioeconomic History  . Marital status: Married    Spouse name: Not on file  . Number of children: Not on file  . Years of education: Not on file  . Highest education level: Not on file  Occupational History  . Not on file  Tobacco Use  . Smoking status: Never Smoker  . Smokeless tobacco: Never Used  Substance and Sexual Activity  . Alcohol use: Yes    Comment: occassionally  . Drug use: No  . Sexual activity: Not Currently  Other Topics Concern  . Not on file  Social History Narrative  . Not on file   Social Determinants of Health   Financial Resource Strain:   . Difficulty of Paying Living Expenses:   Food Insecurity:   . Worried About Charity fundraiser in the Last Year:   . Arboriculturist in the Last Year:   Transportation Needs:   . Film/video editor (Medical):   Marland Kitchen Lack of Transportation (Non-Medical):   Physical Activity:   . Days of Exercise per Week:   . Minutes of Exercise  per Session:   Stress:   . Feeling of Stress :   Social Connections:   . Frequency of Communication with Friends and Family:   . Frequency of Social Gatherings with Friends and Family:   . Attends Religious Services:   . Active Member of Clubs or Organizations:   . Attends Archivist Meetings:   Marland Kitchen Marital Status:      Family History: The patient's family history includes Cancer in his father; Heart attack in his  father; Heart disease in his father. There is no history of Anesthesia problems, Hypotension, Malignant hyperthermia, or Pseudochol deficiency.  ROS:   Please see the history of present illness.     All other systems reviewed and are negative.  EKGs/Labs/Other Studies Reviewed:    The following studies were reviewed today:  Echo 02/21/2019 1. Severe akinesis of the left ventricular, apical inferior wall.  2. The left ventricle has severely reduced systolic function, with an  ejection fraction of 20-25%. Left ventricular diastolic Doppler parameters  are consistent with impaired relaxation. Left ventricular diffuse  hypokinesis.  3. Left atrial size was mildly dilated.  4. Mitral valve regurgitation is moderate by color flow Doppler.  5. Aortic valve regurgitation is mild by color flow Doppler.  6. The aortic root is normal in size and structure.  7. No intracardiac thrombi or masses were visualized.    Cath 02/22/2019  Patent LAD stent.  Mid LAD-2 lesion is 30% stenosed.  Dist LAD lesion is 50% stenosed.  Mid RCA lesion is 30% stenosed.  Dist RCA-1 lesion is 95% stenosed.  A drug-eluting stent was successfully placed using a STENT SYNERGY DES 3X20.  Post intervention, there is a 0% residual stenosis.  LV end diastolic pressure is normal.  There is no aortic valve stenosis.  Would not use right radial approach in teh future given subclavian tortuosity.     EKG:  EKG is not ordered today.    Recent Labs: 04/22/2020: ALT 23; Hemoglobin  13.0; Magnesium 2.2; Platelets 130 04/24/2020: NT-Pro BNP 17,077 04/27/2020: BNP 1,877.0; BUN 20; Creatinine, Ser 1.28; Potassium 4.2; Sodium 143  Recent Lipid Panel    Component Value Date/Time   CHOL 97 (L) 01/03/2020 1126   TRIG 87 01/03/2020 1126   HDL 32 (L) 01/03/2020 1126   CHOLHDL 3.0 01/03/2020 1126   CHOLHDL 3.4 02/21/2019 0226   VLDL 17 02/21/2019 0226   LDLCALC 48 01/03/2020 1126    Physical Exam:    VS:  BP 100/62   Pulse 65   Temp (!) 97 F (36.1 C)   Ht 5\' 5"  (1.651 m)   Wt 158 lb 3.2 oz (71.8 kg)   SpO2 97%   BMI 26.33 kg/m     Wt Readings from Last 3 Encounters:  04/28/20 158 lb 3.2 oz (71.8 kg)  04/27/20 159 lb 9.6 oz (72.4 kg)  04/22/20 169 lb (76.7 kg)     GEN:  Well nourished, well developed in no acute distress HEENT: Normal NECK: No JVD; No carotid bruits LYMPHATICS: No lymphadenopathy CARDIAC: RRR, no murmurs, rubs, gallops RESPIRATORY:  Clear to auscultation without rales, wheezing or rhonchi  ABDOMEN: Soft, non-tender, non-distended MUSCULOSKELETAL: Tender to touch in the right foot dorsal surface, area appears to be erythematous and warm to touch as well. SKIN: Warm and dry NEUROLOGIC:  Alert and oriented x 3 PSYCHIATRIC:  Normal affect   ASSESSMENT:    1. Right foot pain   2. Coronary artery disease involving native coronary artery of native heart without angina pectoris   3. Ischemic cardiomyopathy   4. ICD (implantable cardioverter-defibrillator) in place   5. Essential hypertension   6. Hyperlipidemia LDL goal <70   7. Leg edema    PLAN:    In order of problems listed above:  1. Right foot pain: Differential diagnosis include gout versus cellulitis.  He does not have any recent fever or chill.  Right foot dorsal surface was tender to touch and feels warm when compared to  the other area.  The area also appears to be erythematous as well.  Location involved tarsometatarsal joint.  I suspect that this is related to recent diuresis.   Given his history of ischemic cardiomyopathy, NSAID is not a good choice due to its effect on the sodium and potassium retention which may worsen his heart failure symptoms.  I recommended 2 weeks of colchicine 0.6 mg twice a day.  He is aware to contact cardiology if redness began to creep up the right leg, in that case, I would have a higher suspicion for cellulitis.  At this time, I suspect gout is the more likely diagnosis.  2. CAD: Denies any recent chest pain  3. Ischemic cardiomyopathy s/p ICD: Continue benazepril and carvedilol  4. Hypertension: Blood pressure stable  5. Hyperlipidemia: Continue statin therapy  6. Leg edema: Continue 60 mg daily of Lasix, renal function stable on the last lab work and he appears to have very good diuresis and weight loss.  The only area of swelling involved the posterior ankle area.   Medication Adjustments/Labs and Tests Ordered: Current medicines are reviewed at length with the patient today.  Concerns regarding medicines are outlined above.  No orders of the defined types were placed in this encounter.  Meds ordered this encounter  Medications  . colchicine 0.6 MG tablet    Sig: Take 1 tablet (0.6 mg total) by mouth 2 (two) times daily.    Dispense:  28 tablet    Refill:  0    Patient Instructions  Medication Instructions:   START Colchine 0.6 mg 2 times a day for 2 weeks *If you need a refill on your cardiac medications before your next appointment, please call your pharmacy*  Lab Work: NONE ordered at this time of appointment   If you have labs (blood work) drawn today and your tests are completely normal, you will receive your results only by: Marland Kitchen MyChart Message (if you have MyChart) OR . A paper copy in the mail If you have any lab test that is abnormal or we need to change your treatment, we will call you to review the results.  Testing/Procedures: NONE ordered at this time of appointment   Follow-Up: At [facility], you  and your health needs are our priority.  As part of our continuing mission to provide you with exceptional heart care, we have created designated Provider Care Teams.  These Care Teams include your primary Cardiologist (physician) and Advanced Practice Providers (APPs -  Physician Assistants and Nurse Practitioners) who all work together to provide you with the care you need, when you need it.  We recommend signing up for the patient portal called "MyChart".  Sign up information is provided on this After Visit Summary.  MyChart is used to connect with patients for Virtual Visits (Telemedicine).  Patients are able to view lab/test results, encounter notes, upcoming appointments, etc.  Non-urgent messages can be sent to your provider as well.   To learn more about what you can do with MyChart, go to [email or web address].    Your next appointment:   Follow up as scheduled   The format for your next appointment:   In Person  Provider:   Rosaria Ferries, PA-C  Other Instructions  Keep your scheduled appointment for your ABIs  Continue to monitor the redness of your leg if it spreads upward give our office a call.       Hilbert Corrigan, [place]  04/30/2020 11:36 PM  [facility]

## 2020-04-30 ENCOUNTER — Encounter: Payer: Self-pay | Admitting: Physician Assistant

## 2020-05-03 ENCOUNTER — Encounter: Payer: Self-pay | Admitting: Physician Assistant

## 2020-05-03 ENCOUNTER — Ambulatory Visit (INDEPENDENT_AMBULATORY_CARE_PROVIDER_SITE_OTHER): Payer: Medicare PPO | Admitting: Physician Assistant

## 2020-05-03 ENCOUNTER — Telehealth: Payer: Self-pay | Admitting: Pediatrics

## 2020-05-03 ENCOUNTER — Other Ambulatory Visit: Payer: Self-pay

## 2020-05-03 ENCOUNTER — Ambulatory Visit (HOSPITAL_COMMUNITY)
Admission: RE | Admit: 2020-05-03 | Discharge: 2020-05-03 | Disposition: A | Discharge status: Home / Self Care | Payer: Medicare PPO | Source: Ambulatory Visit | Attending: Cardiology | Admitting: Cardiology

## 2020-05-03 VITALS — BP 120/68 | HR 67 | Ht 66.0 in | Wt 147.4 lb

## 2020-05-03 DIAGNOSIS — M109 Gout, unspecified: Secondary | ICD-10-CM

## 2020-05-03 DIAGNOSIS — I2510 Atherosclerotic heart disease of native coronary artery without angina pectoris: Secondary | ICD-10-CM | POA: Diagnosis not present

## 2020-05-03 DIAGNOSIS — I5022 Chronic systolic (congestive) heart failure: Secondary | ICD-10-CM

## 2020-05-03 DIAGNOSIS — M79671 Pain in right foot: Secondary | ICD-10-CM | POA: Diagnosis present

## 2020-05-03 DIAGNOSIS — E639 Nutritional deficiency, unspecified: Secondary | ICD-10-CM

## 2020-05-03 DIAGNOSIS — R5381 Other malaise: Secondary | ICD-10-CM

## 2020-05-03 LAB — BASIC METABOLIC PANEL
BUN/Creatinine Ratio: 14 (ref 10–24)
BUN: 18 mg/dL (ref 8–27)
CO2: 26 mmol/L (ref 20–29)
Calcium: 9.2 mg/dL (ref 8.6–10.2)
Chloride: 98 mmol/L (ref 96–106)
Creatinine, Ser: 1.27 mg/dL (ref 0.76–1.27)
GFR calc Af Amer: 59 mL/min/{1.73_m2} — ABNORMAL LOW (ref >59)
GFR calc non Af Amer: 51 mL/min/{1.73_m2} — ABNORMAL LOW (ref >59)
Glucose: 93 mg/dL (ref 65–99)
Potassium: 4 mmol/L (ref 3.5–5.2)
Sodium: 141 mmol/L (ref 134–144)

## 2020-05-03 NOTE — Patient Instructions (Addendum)
Medication Instructions:  Colchicine 0.6mg  1 Tablet Daily. ( Take with Largest Meal of The Day.) *If you need a refill on your cardiac medications before your next appointment, please call your pharmacy*   Lab Work: BMP If you have labs (blood work) drawn today and your tests are completely normal, you will receive your results only by:  [place] (if you have MyChart) OR  A paper copy in the mail If you have any lab test that is abnormal or we need to change your treatment, we will call you to review the results.   Testing/Procedures: None   Follow-Up: At [facility], you and your health needs are our priority.  As part of our continuing mission to provide you with exceptional heart care, we have created designated Provider Care Teams.  These Care Teams include your primary Cardiologist (physician) and Advanced Practice Providers (APPs -  Physician Assistants and Nurse Practitioners) who all work together to provide you with the care you need, when you need it.  Your next appointment:   1 month(s)  The format for your next appointment:   In Person  Provider:   Kirk Ruths, MD   Other Instructions Keep Appointment with Dr. Stanford Breed in August. Add protein bar twice daily. Referral for Physical Therapy    WEIGH DAILY, every am, wearing the same amount of clothing Record weights, contact Kirk Ruths, MD for weight gain of 3 lbs in a day or 5 lbs in a week Limit sodium to 500 mg per meal, total 2000 mg per day Limit all liquids to 1.5-2 liters/quarts per day

## 2020-05-08 ENCOUNTER — Other Ambulatory Visit: Payer: Self-pay | Admitting: Physician Assistant

## 2020-05-22 NOTE — Progress Notes (Signed)
HPI: FU coronary disease. He had PCI of his LAD and right coronary artery2003. His ejection fraction was 30%.Has ICD.Abd ultrasound 10/14 showed no aneurysm. Echocardiogram April 2020 showed ejection fraction 20 to 25%, mild left atrial enlargement, moderate mitral regurgitation, mild aortic insufficiency. Cardiac catheterization April 2020 showed patent LAD stent, 50% distal LAD, 95% distal right coronary artery which was treated with a drug-eluting stent. Carotid Dopplers April 2020 showed no significant obstruction. Patient with recent complaints of increased peripheral edema and dyspnea.  Lasix was increased.  Lower extremity venous Dopplers June 2021 showed no DVT.  ABIs July 2021 normal.  Since I last saw him,  patient denies dyspnea, chest pain, palpitations or syncope.  His pedal edema has completely resolved.  Current Outpatient Medications  Medication Sig Dispense Refill   aspirin EC 81 MG tablet Take 81 mg by mouth daily. Swallow whole.     benazepril (LOTENSIN) 10 MG tablet TAKE ONE TABLET BY MOUTH DAILY 90 tablet 3   carvedilol (COREG) 12.5 MG tablet Take 1 tablet (12.5 mg total) by mouth 2 (two) times daily with a meal. 180 tablet 3   eszopiclone (LUNESTA) 2 MG TABS tablet Take 2 mg by mouth daily. Take immediately before bedtime     finasteride (PROSCAR) 5 MG tablet Take 5 mg by mouth daily.     simvastatin (ZOCOR) 40 MG tablet TAKE ONE TABLET BY MOUTH DAILY AT 6PM (Patient taking differently: Take 40 mg by mouth every evening. ) 90 tablet 1   furosemide (LASIX) 20 MG tablet Take 3 tablets (60 mg total) by mouth daily. 270 tablet 1   No current facility-administered medications for this visit.     Past Medical History:  Diagnosis Date   Anxiety    Takes Ativan as needed   Cardiac dysrhythmia, unspecified    CHF (congestive heart failure) ([facility])    Coronary artery disease    Hx of cardiac caths    Enlarged prostate    GERD (gastroesophageal reflux  disease)    Takes prilosec   Gout    Heart attack ([place])    HTN (hypertension)    Hyperkalemia    Hyperlipemia    Ischemic cardiomyopathy    LV dysfunction    Myocardial infarct ([place]) 1988   Panic attacks    Personal history of peptic ulcer disease    Renal insufficiency    Syncope and collapse    Ventricular tachycardia ([place])     Past Surgical History:  Procedure Laterality Date   CARDIAC CATHETERIZATION  2003   CARDIAC DEFIBRILLATOR PLACEMENT     COLONOSCOPY     CORONARY ANGIOPLASTY     CORONARY STENT INTERVENTION N/A 02/22/2019   Procedure: CORONARY STENT INTERVENTION;  Surgeon: Jettie Booze, MD;  Location: [place] [facility];  Service: Cardiovascular;  Laterality: N/A;   ESOPHAGOGASTRODUODENOSCOPY     with biopsy   HERNIA REPAIR  01/01/2012   INSERT / REPLACE / REMOVE PACEMAKER  2005   ICD; replaced in Jan 2013   LEAD REVISION N/A 11/08/2011   Procedure: LEAD REVISION;  Surgeon: Thompson Grayer, MD;  Location: [facility] CATH LAB;  Service: Cardiovascular;  Laterality: N/A;   LEFT HEART CATH AND CORONARY ANGIOGRAPHY N/A 02/22/2019   Procedure: LEFT HEART CATH AND CORONARY ANGIOGRAPHY;  Surgeon: Jettie Booze, MD;  Location: [place] [facility];  Service: Cardiovascular;  Laterality: N/A;   UMBILICAL HERNIA REPAIR     UMBILICAL HERNIA REPAIR  01/01/2012  Procedure: HERNIA REPAIR UMBILICAL ADULT;  Surgeon: Merrie Roof, MD;  Location: [place];  Service: General;  Laterality: N/A;  umbilical hernia repair with mesh    Social History   Socioeconomic History   Marital status: Married    Spouse name: Not on file   Number of children: Not on file   Years of education: Not on file   Highest education level: Not on file  Occupational History   Not on file  Tobacco Use   Smoking status: Never Smoker   Smokeless tobacco: Never Used  Substance and Sexual Activity   Alcohol use: Yes    Comment: occassionally   Drug use: No   Sexual  activity: Not Currently  Other Topics Concern   Not on file  Social History Narrative   Not on file   Social Determinants of Health   Financial Resource Strain:    Difficulty of Paying Living Expenses:   Food Insecurity:    Worried About [place] in the Last Year:    Arboriculturist in the Last Year:   Transportation Needs:    Film/video editor (Medical):    Lack of Transportation (Non-Medical):   Physical Activity:    Days of Exercise per Week:    Minutes of Exercise per Session:   Stress:    Feeling of Stress :   Social Connections:    Frequency of Communication with Friends and Family:    Frequency of Social Gatherings with Friends and Family:    Attends Religious Services:    Active Member of Clubs or Organizations:    Attends Music therapist:    Marital Status:   Intimate Partner Violence:    Fear of Current or Ex-Partner:    Emotionally Abused:    Physically Abused:    Sexually Abused:     Family History  Problem Relation Age of Onset   Heart attack Father        x2   Heart disease Father    Cancer Father        bladder and prostate   Anesthesia problems Neg Hx    Hypotension Neg Hx    Malignant hyperthermia Neg Hx    Pseudochol deficiency Neg Hx     ROS: Pain on ventral aspect of right foot but no fevers or chills, productive cough, hemoptysis, dysphasia, odynophagia, melena, hematochezia, dysuria, hematuria, rash, seizure activity, orthopnea, PND, pedal edema, claudication. Remaining systems are negative.  Physical Exam: Well-developed well-nourished in no acute distress.  Skin is warm and dry.  HEENT is normal.  Neck is supple.  Chest is clear to auscultation with normal expansion.  Cardiovascular exam is regular rate and rhythm.  Abdominal exam nontender or distended. No masses palpated. Extremities show no edema.  No erythema noted. neuro grossly intact   A/P  1 chronic systolic  congestive heart failure-patient's volume status has improved.  We will continue Lasix at present dose.  Check potassium and renal function.  2 ischemic cardiomyopathy-continue ACE inhibitor and beta-blocker.  Patient by report has had hyperkalemia with Entresto in the past.  Repeat echocardiogram.  3 coronary artery disease-no chest pain.  Continue aspirin and statin.  4 hypertension-blood pressure controlled.  Continue present medications.  5 hyperlipidemia-continue statin.  6 prior ICD-Per electrophysiology.  Kirk Ruths, MD

## 2020-05-30 ENCOUNTER — Ambulatory Visit (INDEPENDENT_AMBULATORY_CARE_PROVIDER_SITE_OTHER): Payer: Medicare PPO | Admitting: Cardiology

## 2020-05-30 ENCOUNTER — Other Ambulatory Visit: Payer: Self-pay

## 2020-05-30 ENCOUNTER — Encounter: Payer: Self-pay | Admitting: Cardiology

## 2020-05-30 VITALS — BP 115/65 | HR 67 | Temp 97.0°F | Ht 67.0 in | Wt 144.0 lb

## 2020-05-30 DIAGNOSIS — E785 Hyperlipidemia, unspecified: Secondary | ICD-10-CM | POA: Diagnosis not present

## 2020-05-30 DIAGNOSIS — I255 Ischemic cardiomyopathy: Secondary | ICD-10-CM

## 2020-05-30 DIAGNOSIS — I5022 Chronic systolic (congestive) heart failure: Secondary | ICD-10-CM

## 2020-05-30 DIAGNOSIS — I2510 Atherosclerotic heart disease of native coronary artery without angina pectoris: Secondary | ICD-10-CM | POA: Diagnosis not present

## 2020-05-30 LAB — BASIC METABOLIC PANEL
BUN/Creatinine Ratio: 19 (ref 10–24)
BUN: 23 mg/dL (ref 8–27)
CO2: 28 mmol/L (ref 20–29)
Calcium: 9 mg/dL (ref 8.6–10.2)
Chloride: 96 mmol/L (ref 96–106)
Creatinine, Ser: 1.23 mg/dL (ref 0.76–1.27)
GFR calc Af Amer: 61 mL/min/{1.73_m2} (ref >59)
GFR calc non Af Amer: 53 mL/min/{1.73_m2} — ABNORMAL LOW (ref >59)
Glucose: 94 mg/dL (ref 65–99)
Potassium: 5 mmol/L (ref 3.5–5.2)
Sodium: 136 mmol/L (ref 134–144)

## 2020-05-30 NOTE — Patient Instructions (Signed)
Medication Instructions:  NO CHANGE *If you need a refill on your cardiac medications before your next appointment, please call your pharmacy*   Lab Work: Your physician recommends that you HAVE LAB WORK TODAY If you have labs (blood work) drawn today and your tests are completely normal, you will receive your results only by: . MyChart Message (if you have MyChart) OR . A paper copy in the mail If you have any lab test that is abnormal or we need to change your treatment, we will call you to review the results.   Testing/Procedures: Your physician has requested that you have an echocardiogram. Echocardiography is a painless test that uses sound waves to create images of your heart. It provides your doctor with information about the size and shape of your heart and how well your heart's chambers and valves are working. This procedure takes approximately one hour. There are no restrictions for this procedure.[address]     Follow-Up: At [facility], you and your health needs are our priority.  As part of our continuing mission to provide you with exceptional heart care, we have created designated Provider Care Teams.  These Care Teams include your primary Cardiologist (physician) and Advanced Practice Providers (APPs -  Physician Assistants and Nurse Practitioners) who all work together to provide you with the care you need, when you need it.  We recommend signing up for the patient portal called "MyChart".  Sign up information is provided on this After Visit Summary.  MyChart is used to connect with patients for Virtual Visits (Telemedicine).  Patients are able to view lab/test results, encounter notes, upcoming appointments, etc.  Non-urgent messages can be sent to your provider as well.   To learn more about what you can do with MyChart, go to [email or web address].    Your next appointment:   3 month(s)  The format for your next appointment:   In Person  Provider:    Brian Crenshaw, MD    

## 2020-06-06 ENCOUNTER — Other Ambulatory Visit: Payer: Self-pay | Admitting: Cardiology

## 2020-06-06 ENCOUNTER — Other Ambulatory Visit: Payer: Self-pay | Admitting: Physician Assistant

## 2020-06-06 DIAGNOSIS — I2510 Atherosclerotic heart disease of native coronary artery without angina pectoris: Secondary | ICD-10-CM | POA: Diagnosis not present

## 2020-06-06 DIAGNOSIS — I959 Hypotension, unspecified: Secondary | ICD-10-CM | POA: Diagnosis not present

## 2020-06-06 DIAGNOSIS — I472 Ventricular tachycardia: Secondary | ICD-10-CM | POA: Diagnosis not present

## 2020-06-06 DIAGNOSIS — I119 Hypertensive heart disease without heart failure: Secondary | ICD-10-CM | POA: Diagnosis not present

## 2020-06-06 DIAGNOSIS — I5022 Chronic systolic (congestive) heart failure: Secondary | ICD-10-CM | POA: Diagnosis not present

## 2020-06-06 DIAGNOSIS — L723 Sebaceous cyst: Secondary | ICD-10-CM | POA: Diagnosis not present

## 2020-06-06 DIAGNOSIS — M109 Gout, unspecified: Secondary | ICD-10-CM | POA: Diagnosis not present

## 2020-06-07 ENCOUNTER — Other Ambulatory Visit: Payer: Self-pay | Admitting: Physician Assistant

## 2020-06-07 ENCOUNTER — Telehealth: Payer: Self-pay | Admitting: Cardiology

## 2020-06-07 ENCOUNTER — Ambulatory Visit (INDEPENDENT_AMBULATORY_CARE_PROVIDER_SITE_OTHER): Payer: Medicare PPO | Admitting: *Deleted

## 2020-06-07 DIAGNOSIS — M109 Gout, unspecified: Secondary | ICD-10-CM

## 2020-06-07 DIAGNOSIS — I472 Ventricular tachycardia, unspecified: Secondary | ICD-10-CM

## 2020-06-07 LAB — CUP PACEART REMOTE DEVICE CHECK
Battery Voltage: 2.66 V
Brady Statistic RV Percent Paced: 0.03 %
Date Time Interrogation Session: 20210811033323
HighPow Impedance: 285 Ohm
HighPow Impedance: 41 Ohm
Implantable Lead Implant Date: 20130111
Implantable Lead Location: 753860
Implantable Lead Model: 6935
Implantable Pulse Generator Implant Date: 20130111
Lead Channel Impedance Value: 361 Ohm
Lead Channel Pacing Threshold Amplitude: 0.5 V
Lead Channel Pacing Threshold Pulse Width: 0.4 ms
Lead Channel Sensing Intrinsic Amplitude: 4.125 mV
Lead Channel Sensing Intrinsic Amplitude: 4.125 mV
Lead Channel Setting Pacing Amplitude: 2.5 V
Lead Channel Setting Pacing Pulse Width: 0.4 ms
Lead Channel Setting Sensing Sensitivity: 0.3 mV

## 2020-06-07 NOTE — Telephone Encounter (Signed)
Pt c/o medication issue:  1. Name of Medication: colchicine 0.6 mg  2. How are you currently taking this medication (dosage and times per day)? 1 tablet twice a day  3. Are you having a reaction (difficulty breathing--STAT)? no  4. What is your medication issue? Patient states he is having problems with his gout again and would like to continue taking the medication, but his refill was denied.

## 2020-06-07 NOTE — Telephone Encounter (Signed)
Patient reports his gout has flared back up. He contacted PCP who recommended he take colchicine again. This was originally prescribed by [place] PA on 04/28/2020  Will defer to him to see if he will refill   Pharmacy is [facility] in [place]

## 2020-06-08 ENCOUNTER — Telehealth: Payer: Self-pay | Admitting: *Deleted

## 2020-06-08 MED ORDER — COLCHICINE 0.6 MG PO TABS: 0.6000 mg | ORAL_TABLET | Freq: Every day | ORAL | 0 refills | Status: DC

## 2020-06-08 NOTE — Telephone Encounter (Signed)
That's fine with me, colchicine 0.6mg  daily for 2 weeks

## 2020-06-08 NOTE — Telephone Encounter (Signed)
Placed order for Colchicine 0.6 mg for 2 weeks.   Called patient to inform him that the medication was sent to his pharmacy on file [facility] on [address] and to follow up with his PCP about his gout. Patient verbalized understanding and all (if any) questions were answered.

## 2020-06-08 NOTE — Telephone Encounter (Signed)
Spoke with pt to advise of ICD nearing RRT. Battery voltage currently 2.66V, RRT at 2.63V. Scheduled for monthly battery checks, next on 07/10/20. Pt agreeable to plan and appreciative of call. Pt aware that scheduler will be contacting him to arrange non-urgent f/u with Dr. Lovena Le (recall due 04/2020). No further questions at this time.

## 2020-06-09 ENCOUNTER — Telehealth: Payer: Self-pay

## 2020-06-09 NOTE — Telephone Encounter (Signed)
Pt is scheduled for f/u with Dr. Lovena Le on 07/19/20.

## 2020-06-09 NOTE — Telephone Encounter (Signed)
The pt states his ICD made 3 beeps. I asked him did it sound like the [facility] he states no. I told the pt to send in a transmission before 5 pm and the nurse will look at it and give him a call back. I told him if he can not do it today he can do it anytime this weekend. The nurse will review it Monday morning and give him a call back. I let him know that even if it is the battery he will still have 3 months before the battery die. The pt verbalized understanding and agreed to send the transmission this weekend.

## 2020-06-09 NOTE — Telephone Encounter (Signed)
Spoke with pt. Assisted with manual ICD transmission. Normal ICD function. No alerts. Thoracic impedance chronically below baseline. Battery voltage still 2.66V. Lead trends stable.  Reassured pt regarding normal transmission. Explained solid tone for magnet appllication.and siren tone for RRT/lead issues. Pt states that he has not heard either of these tones. Plan to continue monthly battery checks, next on 07/10/20. No further questions at this time.

## 2020-06-12 NOTE — Progress Notes (Signed)
Remote ICD transmission.   

## 2020-06-16 ENCOUNTER — Ambulatory Visit (HOSPITAL_COMMUNITY): Payer: Medicare PPO | Attending: Cardiology

## 2020-06-16 ENCOUNTER — Other Ambulatory Visit: Payer: Self-pay

## 2020-06-16 DIAGNOSIS — I5022 Chronic systolic (congestive) heart failure: Secondary | ICD-10-CM | POA: Diagnosis not present

## 2020-06-16 DIAGNOSIS — I255 Ischemic cardiomyopathy: Secondary | ICD-10-CM | POA: Insufficient documentation

## 2020-06-16 LAB — ECHOCARDIOGRAM COMPLETE
Area-P 1/2: 4.1 cm2
MV M vel: 4.33 m/s
MV Peak grad: 75 mmHg
P 1/2 time: 384 msec
Radius: 0.6 cm
S' Lateral: 5.3 cm

## 2020-06-16 MED ORDER — PERFLUTREN LIPID MICROSPHERE
1.0000 mL | INTRAVENOUS | Status: AC | PRN
  Administered 2020-06-16: 1 mL via INTRAVENOUS

## 2020-06-21 ENCOUNTER — Telehealth: Payer: Self-pay | Admitting: *Deleted

## 2020-06-21 DIAGNOSIS — I712 Thoracic aortic aneurysm, without rupture, unspecified: Secondary | ICD-10-CM

## 2020-06-21 NOTE — Telephone Encounter (Addendum)
Spoke with pt, aware of results. He was told to not have CT scans because of his device and wants to make sure it is ok. Will forward for dr crenshaw's review.   ---- Message from Lelon Perla, MD sent at 06/16/2020  3:04 PM EDT ----- LV function remains severely reduced; severe MR; schedule chest CTA for dilated aorta Kirk Ruths

## 2020-06-23 DIAGNOSIS — H903 Sensorineural hearing loss, bilateral: Secondary | ICD-10-CM | POA: Diagnosis not present

## 2020-06-23 NOTE — Telephone Encounter (Signed)
Patient advised that Dr. Stanford Breed said it would be okay for him to have the CTA done with his device in place. Advised patient I did not see the CTA scheduled yet but that someone would reach out to him in regards to that.   Patient verbalized understanding.

## 2020-06-23 NOTE — Telephone Encounter (Signed)
Pt can have CTA; he is probably thinking MR that could not be performed due to device Kirk Ruths

## 2020-06-23 NOTE — Telephone Encounter (Signed)
Follow Up:   Pt is still waiting to find out about whether he should have the CT.

## 2020-06-26 NOTE — Telephone Encounter (Signed)
Order placed

## 2020-07-04 ENCOUNTER — Other Ambulatory Visit: Payer: Self-pay | Admitting: Cardiology

## 2020-07-06 ENCOUNTER — Ambulatory Visit
Admission: RE | Admit: 2020-07-06 | Discharge: 2020-07-06 | Disposition: A | Discharge status: Home / Self Care | Payer: Medicare PPO | Source: Ambulatory Visit | Attending: Cardiology | Admitting: Cardiology

## 2020-07-06 DIAGNOSIS — J90 Pleural effusion, not elsewhere classified: Secondary | ICD-10-CM | POA: Diagnosis not present

## 2020-07-06 DIAGNOSIS — I700 Atherosclerosis of aorta: Secondary | ICD-10-CM | POA: Diagnosis not present

## 2020-07-06 DIAGNOSIS — I712 Thoracic aortic aneurysm, without rupture, unspecified: Secondary | ICD-10-CM

## 2020-07-06 DIAGNOSIS — I2510 Atherosclerotic heart disease of native coronary artery without angina pectoris: Secondary | ICD-10-CM | POA: Diagnosis not present

## 2020-07-06 MED ORDER — IOPAMIDOL (ISOVUE-370) INJECTION 76%
75.0000 mL | Freq: Once | INTRAVENOUS | Status: AC | PRN
  Administered 2020-07-06: 75 mL via INTRAVENOUS

## 2020-07-10 ENCOUNTER — Ambulatory Visit (INDEPENDENT_AMBULATORY_CARE_PROVIDER_SITE_OTHER): Payer: Medicare PPO | Admitting: *Deleted

## 2020-07-10 DIAGNOSIS — I5022 Chronic systolic (congestive) heart failure: Secondary | ICD-10-CM

## 2020-07-10 DIAGNOSIS — I255 Ischemic cardiomyopathy: Secondary | ICD-10-CM

## 2020-07-10 LAB — CUP PACEART REMOTE DEVICE CHECK
Battery Voltage: 2.65 V
Brady Statistic RV Percent Paced: 0.1 %
Date Time Interrogation Session: 20210913033524
HighPow Impedance: 285 Ohm
HighPow Impedance: 43 Ohm
Implantable Lead Implant Date: 20130111
Implantable Lead Location: 753860
Implantable Lead Model: 6935
Implantable Pulse Generator Implant Date: 20130111
Lead Channel Impedance Value: 361 Ohm
Lead Channel Pacing Threshold Amplitude: 0.5 V
Lead Channel Pacing Threshold Pulse Width: 0.4 ms
Lead Channel Sensing Intrinsic Amplitude: 4.25 mV
Lead Channel Sensing Intrinsic Amplitude: 4.25 mV
Lead Channel Setting Pacing Amplitude: 2.5 V
Lead Channel Setting Pacing Pulse Width: 0.4 ms
Lead Channel Setting Sensing Sensitivity: 0.3 mV

## 2020-07-11 DIAGNOSIS — I5022 Chronic systolic (congestive) heart failure: Secondary | ICD-10-CM | POA: Diagnosis not present

## 2020-07-11 DIAGNOSIS — I2510 Atherosclerotic heart disease of native coronary artery without angina pectoris: Secondary | ICD-10-CM | POA: Diagnosis not present

## 2020-07-11 DIAGNOSIS — N183 Chronic kidney disease, stage 3 unspecified: Secondary | ICD-10-CM | POA: Diagnosis not present

## 2020-07-11 DIAGNOSIS — I129 Hypertensive chronic kidney disease with stage 1 through stage 4 chronic kidney disease, or unspecified chronic kidney disease: Secondary | ICD-10-CM | POA: Diagnosis not present

## 2020-07-11 DIAGNOSIS — G4700 Insomnia, unspecified: Secondary | ICD-10-CM | POA: Diagnosis not present

## 2020-07-11 DIAGNOSIS — I472 Ventricular tachycardia: Secondary | ICD-10-CM | POA: Diagnosis not present

## 2020-07-11 DIAGNOSIS — E782 Mixed hyperlipidemia: Secondary | ICD-10-CM | POA: Diagnosis not present

## 2020-07-11 DIAGNOSIS — F419 Anxiety disorder, unspecified: Secondary | ICD-10-CM | POA: Diagnosis not present

## 2020-07-11 DIAGNOSIS — I119 Hypertensive heart disease without heart failure: Secondary | ICD-10-CM | POA: Diagnosis not present

## 2020-07-12 NOTE — Progress Notes (Signed)
Remote ICD transmission.   

## 2020-07-19 ENCOUNTER — Ambulatory Visit: Payer: Medicare PPO | Admitting: Internal Medicine

## 2020-07-19 ENCOUNTER — Other Ambulatory Visit: Payer: Self-pay

## 2020-07-19 ENCOUNTER — Encounter: Payer: Self-pay | Admitting: Internal Medicine

## 2020-07-19 VITALS — BP 100/62 | HR 64 | Ht 67.0 in | Wt 144.4 lb

## 2020-07-19 DIAGNOSIS — Z95810 Presence of automatic (implantable) cardiac defibrillator: Secondary | ICD-10-CM | POA: Diagnosis not present

## 2020-07-19 DIAGNOSIS — I4729 Other ventricular tachycardia: Secondary | ICD-10-CM

## 2020-07-19 DIAGNOSIS — I472 Ventricular tachycardia: Secondary | ICD-10-CM

## 2020-07-19 DIAGNOSIS — I5020 Unspecified systolic (congestive) heart failure: Secondary | ICD-10-CM | POA: Diagnosis not present

## 2020-07-19 DIAGNOSIS — I10 Essential (primary) hypertension: Secondary | ICD-10-CM

## 2020-07-19 DIAGNOSIS — I255 Ischemic cardiomyopathy: Secondary | ICD-10-CM | POA: Diagnosis not present

## 2020-07-19 NOTE — Progress Notes (Signed)
HPI Mr. Steinke returns today for followup. He is a pleasant 86 yo man with a h/o an ICM, s/p MI, chronic systolic heart failure, and VT, s/p ICD insertion. He admits to feeling weak. He notes that his bp is elevated at times as well as a little low at others. He denies anginal symptoms.  Allergies  Allergen Reactions  . Entresto [Sacubitril-Valsartan] Other (See Comments)    hyperkalemia  . Niacin And Related Itching and Rash  . Codeine Anxiety     Current Outpatient Medications  Medication Sig Dispense Refill  . aspirin EC 81 MG tablet Take 81 mg by mouth daily. Swallow whole.    . carvedilol (COREG) 12.5 MG tablet Take 1 tablet (12.5 mg total) by mouth 2 (two) times daily with a meal. 180 tablet 3  . colchicine 0.6 MG tablet Take 1 tablet (0.6 mg total) by mouth daily. Future refills send to patients PCP 14 tablet 0  . finasteride (PROSCAR) 5 MG tablet Take 5 mg by mouth daily.    . furosemide (LASIX) 20 MG tablet Take 3 tablets (60 mg total) by mouth daily. 270 tablet 1  . LORazepam (ATIVAN) 0.5 MG tablet     . simvastatin (ZOCOR) 40 MG tablet TAKE ONE TABLET BY MOUTH DAILY AT 6PM (Patient taking differently: Take 40 mg by mouth every evening. ) 90 tablet 1   No current facility-administered medications for this visit.     Past Medical History:  Diagnosis Date  . Anxiety    Takes Ativan as needed  . Cardiac dysrhythmia, unspecified   . CHF (congestive heart failure) ([facility])   . Coronary artery disease    Hx of cardiac caths   . Enlarged prostate   . GERD (gastroesophageal reflux disease)    Takes prilosec  . Gout   . Heart attack ([place])   . HTN (hypertension)   . Hyperkalemia   . Hyperlipemia   . Ischemic cardiomyopathy   . LV dysfunction   . Myocardial infarct ([place]) 1988  . Panic attacks   . Personal history of peptic ulcer disease   . Renal insufficiency   . Syncope and collapse   . Ventricular tachycardia ([place])     ROS:   All systems reviewed and  negative except as noted in the HPI.   Past Surgical History:  Procedure Laterality Date  . CARDIAC CATHETERIZATION  2003  . CARDIAC DEFIBRILLATOR PLACEMENT    . COLONOSCOPY    . CORONARY ANGIOPLASTY    . CORONARY STENT INTERVENTION N/A 02/22/2019   Procedure: CORONARY STENT INTERVENTION;  Surgeon: Jettie Booze, MD;  Location: [facility];  Service: Cardiovascular;  Laterality: N/A;  . ESOPHAGOGASTRODUODENOSCOPY     with biopsy  . HERNIA REPAIR  01/01/2012  . INSERT / REPLACE / REMOVE PACEMAKER  2005   ICD; replaced in Jan 2013  . LEAD REVISION N/A 11/08/2011   Procedure: LEAD REVISION;  Surgeon: Thompson Grayer, MD;  Location: [facility] CATH LAB;  Service: Cardiovascular;  Laterality: N/A;  . LEFT HEART CATH AND CORONARY ANGIOGRAPHY N/A 02/22/2019   Procedure: LEFT HEART CATH AND CORONARY ANGIOGRAPHY;  Surgeon: Jettie Booze, MD;  Location: [place] [facility];  Service: Cardiovascular;  Laterality: N/A;  . UMBILICAL HERNIA REPAIR    . UMBILICAL HERNIA REPAIR  01/01/2012   Procedure: HERNIA REPAIR UMBILICAL ADULT;  Surgeon: Merrie Roof, MD;  Location: [place];  Service: General;  Laterality: N/A;  umbilical hernia  repair with mesh     Family History  Problem Relation Age of Onset  . Heart attack Father        x2  . Heart disease Father   . Cancer Father        bladder and prostate  . Anesthesia problems Neg Hx   . Hypotension Neg Hx   . Malignant hyperthermia Neg Hx   . Pseudochol deficiency Neg Hx      Social History   Socioeconomic History  . Marital status: Married    Spouse name: Not on file  . Number of children: Not on file  . Years of education: Not on file  . Highest education level: Not on file  Occupational History  . Not on file  Tobacco Use  . Smoking status: Never Smoker  . Smokeless tobacco: Never Used  Substance and Sexual Activity  . Alcohol use: Yes    Comment: occassionally  . Drug use: No  . Sexual activity: Not Currently  Other  Topics Concern  . Not on file  Social History Narrative  . Not on file   Social Determinants of Health   Financial Resource Strain:   . Difficulty of Paying Living Expenses: Not on file  Food Insecurity:   . Worried About Charity fundraiser in the Last Year: Not on file  . Ran Out of Food in the Last Year: Not on file  Transportation Needs:   . Lack of Transportation (Medical): Not on file  . Lack of Transportation (Non-Medical): Not on file  Physical Activity:   . Days of Exercise per Week: Not on file  . Minutes of Exercise per Session: Not on file  Stress:   . Feeling of Stress : Not on file  Social Connections:   . Frequency of Communication with Friends and Family: Not on file  . Frequency of Social Gatherings with Friends and Family: Not on file  . Attends Religious Services: Not on file  . Active Member of Clubs or Organizations: Not on file  . Attends Archivist Meetings: Not on file  . Marital Status: Not on file  Intimate Partner Violence:   . Fear of Current or Ex-Partner: Not on file  . Emotionally Abused: Not on file  . Physically Abused: Not on file  . Sexually Abused: Not on file     BP 100/62   Pulse 64   Ht 5\' 7"  (1.702 m)   Wt 144 lb 6.4 oz (65.5 kg)   SpO2 97%   BMI 22.62 kg/m   Physical Exam:  Well appearing NAD HEENT: Unremarkable Neck:  No JVD, no thyromegally Lymphatics:  No adenopathy Back:  No CVA tenderness Lungs:  Clear with no wheezes HEART:  Regular rate rhythm, no murmurs, no rubs, no clicks Abd:  soft, positive bowel sounds, no organomegally, no rebound, no guarding Ext:  2 plus pulses, no edema, no cyanosis, no clubbing Skin:  No rashes no nodules Neuro:  CN II through XII intact, motor grossly intact  EKG - nsr  DEVICE  Normal device function.  See PaceArt for details.   Assess/Plan: 1. Chronic systolic heart failure - his symptoms are class 2 but mostly low output. I told him to call us if his bp remains  low. 2. VT - he has not had any additional ICD therapies. 3. ICD - he is approaching ERI. 4. CAD - he denies anginal symptoms. He is encouraged to increase his activity.  Carleene Overlie Que Meneely,MD

## 2020-07-19 NOTE — Patient Instructions (Addendum)
Medication Instructions:  Your physician has recommended you make the following change in your medication:   1.  Increase your LASIX 20 mg- Take 2 tablets in the AM and 2 tablets after lunch.  I will call you next week.  If you are feeling better on this new regimen I will send in a new prescription for you and will have you come to the office for a BMP (lab work).  Labwork: None ordered.  Testing/Procedures: None ordered.  Follow-Up: Your physician wants you to follow-up in: one year with Dr. Lovena Le.   You will receive a reminder letter in the mail two months in advance. If you don't receive a letter, please call our office to schedule the follow-up appointment.  Remote monitoring is used to monitor your ICD from home. This monitoring reduces the number of office visits required to check your device to one time per year. It allows [place] to keep an eye on the functioning of your device to ensure it is working properly. You are scheduled for a device check from home on 08/10/2020. You may send your transmission at any time that day. If you have a wireless device, the transmission will be sent automatically. After your physician reviews your transmission, you will receive a postcard with your next transmission date.  Any Other Special Instructions Will Be Listed Below (If Applicable).  If you need a refill on your cardiac medications before your next appointment, please call your pharmacy.

## 2020-07-24 LAB — CUP PACEART INCLINIC DEVICE CHECK
Battery Voltage: 2.64 V
Brady Statistic RV Percent Paced: 0.06 %
Date Time Interrogation Session: 20210922164906
HighPow Impedance: 285 Ohm
HighPow Impedance: 40 Ohm
Implantable Lead Implant Date: 20130111
Implantable Lead Location: 753860
Implantable Lead Model: 6935
Implantable Pulse Generator Implant Date: 20130111
Lead Channel Impedance Value: 361 Ohm
Lead Channel Pacing Threshold Amplitude: 0.5 V
Lead Channel Pacing Threshold Pulse Width: 0.4 ms
Lead Channel Sensing Intrinsic Amplitude: 4.375 mV
Lead Channel Sensing Intrinsic Amplitude: 4.875 mV
Lead Channel Setting Pacing Amplitude: 2.5 V
Lead Channel Setting Pacing Pulse Width: 0.4 ms
Lead Channel Setting Sensing Sensitivity: 0.3 mV

## 2020-07-27 DIAGNOSIS — F4321 Adjustment disorder with depressed mood: Secondary | ICD-10-CM | POA: Diagnosis not present

## 2020-07-28 ENCOUNTER — Telehealth: Payer: Self-pay

## 2020-07-28 DIAGNOSIS — I5020 Unspecified systolic (congestive) heart failure: Secondary | ICD-10-CM

## 2020-07-28 MED ORDER — TORSEMIDE 20 MG PO TABS: 20.0000 mg | ORAL_TABLET | Freq: Every day | ORAL | 11 refills | Status: DC

## 2020-07-28 NOTE — Telephone Encounter (Signed)
Call placed to Pt to discuss if he felt better on new furosemide regimen.  Per Pt he has not noticed any difference.  Per Dr. Lovena Le--  1.  STOP furosemide (lasix) 2.  START taking torsemide 20 mg one tablet by mouth daily.  3.  You will need lab work AFTER you have been on the torsemide for 2 weeks.   Pt will send mychart message to this nurse to advise when he can come in for lab work.

## 2020-08-05 DIAGNOSIS — Z23 Encounter for immunization: Secondary | ICD-10-CM | POA: Diagnosis not present

## 2020-08-10 ENCOUNTER — Ambulatory Visit (INDEPENDENT_AMBULATORY_CARE_PROVIDER_SITE_OTHER): Payer: Medicare PPO

## 2020-08-10 DIAGNOSIS — I255 Ischemic cardiomyopathy: Secondary | ICD-10-CM

## 2020-08-11 ENCOUNTER — Other Ambulatory Visit: Payer: Self-pay

## 2020-08-11 DIAGNOSIS — I5020 Unspecified systolic (congestive) heart failure: Secondary | ICD-10-CM

## 2020-08-11 LAB — CUP PACEART REMOTE DEVICE CHECK
Battery Voltage: 2.64 V
Brady Statistic RV Percent Paced: 0.32 %
Date Time Interrogation Session: 20211014033423
HighPow Impedance: 285 Ohm
HighPow Impedance: 39 Ohm
Implantable Lead Implant Date: 20130111
Implantable Lead Location: 753860
Implantable Lead Model: 6935
Implantable Pulse Generator Implant Date: 20130111
Lead Channel Impedance Value: 361 Ohm
Lead Channel Pacing Threshold Amplitude: 0.5 V
Lead Channel Pacing Threshold Pulse Width: 0.4 ms
Lead Channel Sensing Intrinsic Amplitude: 4 mV
Lead Channel Sensing Intrinsic Amplitude: 4 mV
Lead Channel Setting Pacing Amplitude: 2.5 V
Lead Channel Setting Pacing Pulse Width: 0.4 ms
Lead Channel Setting Sensing Sensitivity: 0.3 mV

## 2020-08-11 LAB — BASIC METABOLIC PANEL
BUN/Creatinine Ratio: 14 (ref 10–24)
BUN: 24 mg/dL (ref 8–27)
CO2: 26 mmol/L (ref 20–29)
Calcium: 9.2 mg/dL (ref 8.6–10.2)
Chloride: 99 mmol/L (ref 96–106)
Creatinine, Ser: 1.66 mg/dL — ABNORMAL HIGH (ref 0.76–1.27)
GFR calc Af Amer: 43 mL/min/{1.73_m2} — ABNORMAL LOW (ref >59)
GFR calc non Af Amer: 37 mL/min/{1.73_m2} — ABNORMAL LOW (ref >59)
Glucose: 87 mg/dL (ref 65–99)
Potassium: 4.8 mmol/L (ref 3.5–5.2)
Sodium: 139 mmol/L (ref 134–144)

## 2020-08-12 ENCOUNTER — Ambulatory Visit: Payer: Medicare PPO | Attending: Internal Medicine

## 2020-08-12 ENCOUNTER — Other Ambulatory Visit: Payer: Self-pay

## 2020-08-12 DIAGNOSIS — Z23 Encounter for immunization: Secondary | ICD-10-CM

## 2020-08-12 NOTE — Progress Notes (Signed)
   Covid-19 Vaccination Clinic  Name:  Adrian Pace    MRN: [id number] DOB: 10-Oct-1933  08/12/2020  [name] was observed post Covid-19 immunization for 15 minutes without incident. He was provided with Vaccine Information Sheet and instruction to access the V-Safe system.   [name] was instructed to call 911 with any severe reactions post vaccine: Marland Kitchen Difficulty breathing  . Swelling of face and throat  . A fast heartbeat  . A bad rash all over body  . Dizziness and weakness

## 2020-08-15 NOTE — Progress Notes (Signed)
Remote ICD transmission.   

## 2020-08-15 NOTE — Addendum Note (Signed)
Addended by: Cheri Kearns A on: 08/15/2020 12:19 PM   Modules accepted: Level of Service

## 2020-08-16 ENCOUNTER — Other Ambulatory Visit: Payer: Self-pay

## 2020-08-16 ENCOUNTER — Ambulatory Visit (INDEPENDENT_AMBULATORY_CARE_PROVIDER_SITE_OTHER): Payer: Medicare PPO | Admitting: Cardiology

## 2020-08-16 ENCOUNTER — Encounter: Payer: Self-pay | Admitting: Cardiology

## 2020-08-16 VITALS — BP 102/58 | HR 70 | Ht 67.0 in | Wt 149.6 lb

## 2020-08-16 DIAGNOSIS — I340 Nonrheumatic mitral (valve) insufficiency: Secondary | ICD-10-CM

## 2020-08-16 DIAGNOSIS — N189 Chronic kidney disease, unspecified: Secondary | ICD-10-CM | POA: Insufficient documentation

## 2020-08-16 DIAGNOSIS — I255 Ischemic cardiomyopathy: Secondary | ICD-10-CM

## 2020-08-16 DIAGNOSIS — I5020 Unspecified systolic (congestive) heart failure: Secondary | ICD-10-CM

## 2020-08-16 DIAGNOSIS — N289 Disorder of kidney and ureter, unspecified: Secondary | ICD-10-CM

## 2020-08-16 DIAGNOSIS — Z95810 Presence of automatic (implantable) cardiac defibrillator: Secondary | ICD-10-CM | POA: Diagnosis not present

## 2020-08-16 DIAGNOSIS — Z9861 Coronary angioplasty status: Secondary | ICD-10-CM

## 2020-08-16 DIAGNOSIS — I2510 Atherosclerotic heart disease of native coronary artery without angina pectoris: Secondary | ICD-10-CM

## 2020-08-16 NOTE — Assessment & Plan Note (Signed)
EF Aug 2021- 20-25% by echo

## 2020-08-16 NOTE — Assessment & Plan Note (Signed)
MDT ICD in place- lead revision 2013 Nearing ERI- followed by Dr Lovena Le

## 2020-08-16 NOTE — Assessment & Plan Note (Signed)
RCA and LAD PCI 2003 NSTEMI April 2020- new stenosis proximal to previously placed RCA stent-treated with PCI/DES. RCA and LAD stents from 2003 patent.

## 2020-08-16 NOTE — Assessment & Plan Note (Signed)
See echo Aug 2021

## 2020-08-16 NOTE — Progress Notes (Signed)
Cardiology Office Note:    Date:  08/16/2020   ID:  Adrian Pace, DOB September 02, 1933, MRN [id number]  PCP:  Kelton Pillar, MD  Cardiologist:  Kirk Ruths, MD  Electrophysiologist:  Cristopher Peru, MD   Referring MD: Kelton Pillar, MD   CC:  LE edema, chronic DOE  History of Present Illness:    Adrian Pace is a 86 y.o. male with a hx of CAD. He had a remote RCA and LAD PCI in 2003.  He has an ischemic cardiomyopathy.  He had a Medtronic ICD placed in 2005, he had a lead revision in 2013. Dr Lovena Le follows him, he is approaching ERI per his last office note 07/19/2020.  His ejection fraction has been 20 to 30%. The patient had done well until this last year.   The patient was admitted  02/20/2019 with a NSTEMI.   At catheterization his previously placed LAD and RCA stents were patent.  He did have a new 90% RCA stenosis just proximal to the previously placed stent.  This was treated with PCI and DES.  Echocardiogram prior to discharge showed an ejection fraction of 20 to 25%.  He has had problems with LE edema and DOE over the last several months.  His diuretics have been adjusted.  He was recently changed to Torsemide from Lasix.  He is seen today in the office accompanied by his daughter.  He complains of fatigue with exertion and LE edema.  He denies orthopnea.  His B/P by me is 90 systolic.   Past Medical History:  Diagnosis Date  . Anxiety    Takes Ativan as needed  . Cardiac dysrhythmia, unspecified   . CHF (congestive heart failure) ([facility])   . Coronary artery disease    Hx of cardiac caths   . Enlarged prostate   . GERD (gastroesophageal reflux disease)    Takes prilosec  . Gout   . Heart attack ([place])   . HTN (hypertension)   . Hyperkalemia   . Hyperlipemia   . Ischemic cardiomyopathy   . LV dysfunction   . Myocardial infarct ([place]) 1988  . Panic attacks   . Personal history of peptic ulcer disease   . Renal insufficiency   . Syncope and collapse   .  Ventricular tachycardia [facility])     Past Surgical History:  Procedure Laterality Date  . CARDIAC CATHETERIZATION  2003  . CARDIAC DEFIBRILLATOR PLACEMENT    . COLONOSCOPY    . CORONARY ANGIOPLASTY    . CORONARY STENT INTERVENTION N/A 02/22/2019   Procedure: CORONARY STENT INTERVENTION;  Surgeon: Jettie Booze, MD;  Location: [place] [facility];  Service: Cardiovascular;  Laterality: N/A;  . ESOPHAGOGASTRODUODENOSCOPY     with biopsy  . HERNIA REPAIR  01/01/2012  . INSERT / REPLACE / REMOVE PACEMAKER  2005   ICD; replaced in Jan 2013  . LEAD REVISION N/A 11/08/2011   Procedure: LEAD REVISION;  Surgeon: Thompson Grayer, MD;  Location: [facility] CATH LAB;  Service: Cardiovascular;  Laterality: N/A;  . LEFT HEART CATH AND CORONARY ANGIOGRAPHY N/A 02/22/2019   Procedure: LEFT HEART CATH AND CORONARY ANGIOGRAPHY;  Surgeon: Jettie Booze, MD;  Location: [place] [facility];  Service: Cardiovascular;  Laterality: N/A;  . UMBILICAL HERNIA REPAIR    . UMBILICAL HERNIA REPAIR  01/01/2012   Procedure: HERNIA REPAIR UMBILICAL ADULT;  Surgeon: Merrie Roof, MD;  Location: [place];  Service: General;  Laterality: N/A;  umbilical hernia repair with mesh  Current Medications: Current Meds  Medication Sig  . aspirin EC 81 MG tablet Take 81 mg by mouth daily. Swallow whole.  . carvedilol (COREG) 12.5 MG tablet Take 1 tablet (12.5 mg total) by mouth 2 (two) times daily with a meal.  . colchicine 0.6 MG tablet Take 1 tablet (0.6 mg total) by mouth daily. Future refills send to patients PCP  . finasteride (PROSCAR) 5 MG tablet Take 5 mg by mouth daily.  Marland Kitchen LORazepam (ATIVAN) 0.5 MG tablet   . simvastatin (ZOCOR) 40 MG tablet TAKE ONE TABLET BY MOUTH DAILY AT 6PM (Patient taking differently: Take 40 mg by mouth every evening. )  . torsemide (DEMADEX) 20 MG tablet Take 1 tablet (20 mg total) by mouth daily.     Allergies:   Entresto [sacubitril-valsartan], Niacin and related, and Codeine   Social History    Socioeconomic History  . Marital status: Married    Spouse name: Not on file  . Number of children: Not on file  . Years of education: Not on file  . Highest education level: Not on file  Occupational History  . Not on file  Tobacco Use  . Smoking status: Never Smoker  . Smokeless tobacco: Never Used  Substance and Sexual Activity  . Alcohol use: Yes    Comment: occassionally  . Drug use: No  . Sexual activity: Not Currently  Other Topics Concern  . Not on file  Social History Narrative  . Not on file   Social Determinants of Health   Financial Resource Strain:   . Difficulty of Paying Living Expenses: Not on file  Food Insecurity:   . Worried About Charity fundraiser in the Last Year: Not on file  . Ran Out of Food in the Last Year: Not on file  Transportation Needs:   . Lack of Transportation (Medical): Not on file  . Lack of Transportation (Non-Medical): Not on file  Physical Activity:   . Days of Exercise per Week: Not on file  . Minutes of Exercise per Session: Not on file  Stress:   . Feeling of Stress : Not on file  Social Connections:   . Frequency of Communication with Friends and Family: Not on file  . Frequency of Social Gatherings with Friends and Family: Not on file  . Attends Religious Services: Not on file  . Active Member of Clubs or Organizations: Not on file  . Attends Archivist Meetings: Not on file  . Marital Status: Not on file     Family History: The patient's family history includes Cancer in his father; Heart attack in his father; Heart disease in his father. There is no history of Anesthesia problems, Hypotension, Malignant hyperthermia, or Pseudochol deficiency.  ROS:   Please see the history of present illness.     All other systems reviewed and are negative.  EKGs/Labs/Other Studies Reviewed:    The following studies were reviewed today: Echo 06/16/2020- IMPRESSIONS    1. Akinesis of the anteroseptal, apical and  inferior walls.  2. Left ventricular ejection fraction, by estimation, is 20 to 25%. The  left ventricle has severely decreased function. The left ventricle  demonstrates regional wall motion abnormalities (see scoring  diagram/findings for description). The left  ventricular internal cavity size was mildly dilated. Left ventricular  diastolic parameters are consistent with Grade III diastolic dysfunction  (restrictive). Elevated left atrial pressure.  3. Right ventricular systolic function is mildly reduced. The right  ventricular size is mildly  enlarged. There is severely elevated pulmonary  artery systolic pressure.  4. Left atrial size was mildly dilated.  5. Right atrial size was severely dilated.  6. The mitral valve is normal in structure. Severe mitral valve  regurgitation. No evidence of mitral stenosis.  7. Tricuspid valve regurgitation is moderate.  8. The aortic valve is tricuspid. Aortic valve regurgitation is mild to  moderate. Mild aortic valve sclerosis is present, with no evidence of  aortic valve stenosis.  9. Aortic dilatation noted. 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  10. The inferior vena cava is dilated in size with <50% respiratory  variability, suggesting right atrial pressure of 15 mmHg.   Comparison(s): 02/21/19 EF 20-25%.   EKG:  EKG is not ordered today.  The ekg ordered 07/19/2020 demonstrates NSR, IVCD, poor anterior RW  Recent Labs: 04/22/2020: ALT 23; Hemoglobin 13.0; Magnesium 2.2; Platelets 130 04/24/2020: NT-Pro BNP 17,077 04/27/2020: BNP 1,877.0 08/11/2020: BUN 24; Creatinine, Ser 1.66; Potassium 4.8; Sodium 139  Recent Lipid Panel    Component Value Date/Time   CHOL 97 (L) 01/03/2020 1126   TRIG 87 01/03/2020 1126   HDL 32 (L) 01/03/2020 1126   CHOLHDL 3.0 01/03/2020 1126   CHOLHDL 3.4 02/21/2019 0226   VLDL 17 02/21/2019 0226   LDLCALC 48 01/03/2020 1126    Physical Exam:    B/P by me 90 systolic  Lt arm  VS:  BP (!) 102/58   Pulse 70   Ht 5\' 7"  (1.702 m)   Wt 149 lb 9.6 oz (67.9 kg)   SpO2 98%   BMI 23.43 kg/m     Wt Readings from Last 3 Encounters:  08/16/20 149 lb 9.6 oz (67.9 kg)  07/19/20 144 lb 6.4 oz (65.5 kg)  05/30/20 144 lb (65.3 kg)     GEN: Thin, elderly male, in no acute distress HEENT: Normal NECK: No JVD; No carotid bruits CARDIAC: RRR, 2/6 MR murmur, no rubs, gallops RESPIRATORY:  Bi basilar crackles ABDOMEN: Soft, non-tender, non-distended MUSCULOSKELETAL:  1-2+ pitting pre tibial edema edema; No deformity  SKIN: Warm and dry NEUROLOGIC:  Alert and oriented x 3 PSYCHIATRIC:  Normal affect   ASSESSMENT:    Low output heart failure (HCC) EF 40-34%, B/P 90 systolic, pt with an elevated SCr and edema on exam. I suggested a referral to [facility]  Ischemic cardiomyopathy EF Aug 2021- 20-25% by echo  Severe mitral regurgitation See echo Aug 2021  CAD S/P percutaneous coronary angioplasty RCA and LAD PCI 2003 NSTEMI April 2020- new stenosis proximal to previously placed RCA stent-treated with PCI/DES. RCA and LAD stents from 2003 patent.  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  I explained that I had no good options as far as medication adjustments to offer them today as an OP.  They clearly would like to pursue any options.  I suggested a referral to the [facility] and they are agreeable.    Medication Adjustments/Labs and Tests Ordered: Current medicines are reviewed at length with the patient today.  Concerns regarding medicines are outlined above.  Orders Placed This Encounter  Procedures  . AMB referral to CHF clinic   No orders of the  defined types were placed in this encounter.   Patient Instructions  Medication Instructions:  Continue heart medicine.  *If you need a refill on your cardiac medications before your next appointment, please call your pharmacy*   Lab Work: None were ordered.    Testing/Procedures: None ordered.   Follow-Up: At [facility], you and your health needs are our priority.  As part of our continuing mission to provide you with exceptional heart care, we have created designated Provider Care Teams.  These Care Teams include your primary Cardiologist (physician) and Advanced Practice Providers (APPs -  Physician Assistants and Nurse Practitioners) who all work together to provide you with the care you need, when you need it.  We recommend signing up for the patient portal called "MyChart".  Sign up information is provided on this After Visit Summary.  MyChart is used to connect with patients for Virtual Visits (Telemedicine).  Patients are able to view lab/test results, encounter notes, upcoming appointments, etc.  Non-urgent messages can be sent to your provider as well.   To learn more about what you can do with MyChart, go to [email or web address].    Your next appointment:   You have been referred to heart failure clinic.       Signed, Kerin Ransom, PA-C  08/16/2020 3:41 PM    [facility]

## 2020-08-16 NOTE — Assessment & Plan Note (Signed)
EF 66-91%, B/P 90 systolic, pt with an elevated SCr and edema on exam. I suggested a referral to [facility]

## 2020-08-16 NOTE — Patient Instructions (Signed)
Medication Instructions:  Continue heart medicine.  *If you need a refill on your cardiac medications before your next appointment, please call your pharmacy*   Lab Work: None were ordered.    Testing/Procedures: None ordered.   Follow-Up: At [facility], you and your health needs are our priority.  As part of our continuing mission to provide you with exceptional heart care, we have created designated Provider Care Teams.  These Care Teams include your primary Cardiologist (physician) and Advanced Practice Providers (APPs -  Physician Assistants and Nurse Practitioners) who all work together to provide you with the care you need, when you need it.  We recommend signing up for the patient portal called "MyChart".  Sign up information is provided on this After Visit Summary.  MyChart is used to connect with patients for Virtual Visits (Telemedicine).  Patients are able to view lab/test results, encounter notes, upcoming appointments, etc.  Non-urgent messages can be sent to your provider as well.   To learn more about what you can do with MyChart, go to [email or web address].    Your next appointment:   You have been referred to heart failure clinic.

## 2020-08-16 NOTE — Assessment & Plan Note (Signed)
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

## 2020-09-01 NOTE — Progress Notes (Signed)
HPI: FU coronary disease. He had PCI of his LAD and right coronary artery2003. His ejection fraction was 30%.Has ICD.Abd ultrasound 10/14 showed no aneurysm. Cardiac catheterization April 2020 showed patent LAD stent, 50% distal LAD, 95% distal right coronary artery which was treated with a drug-eluting stent. Carotid Dopplers April 2020 showed no significant obstruction. ABIs July 2021 normal.  Last echocardiogram August 2021 showed ejection fraction 20 to 25%, mild left ventricular enlargement, restrictive filling, mild RV dysfunction, mild right ventricular enlargement, severe pulmonary hypertension, biatrial enlargement, severe mitral regurgitation, moderate tricuspid regurgitation, mild to moderate aortic insufficiency and moderately dilated ascending aorta at 46 mm.  CTA September 2021 showed dilated ascending aorta measuring 4.2 cm; 6 mm left lung nodule and follow-up recommended in 6 to 12 months. Since I last saw him,patient denies dyspnea, chest pain, palpitations or syncope.  He has mild pedal edema.  His weight has been stable.  He does have fatigue.  Current Outpatient Medications  Medication Sig Dispense Refill  . aspirin EC 81 MG tablet Take 81 mg by mouth daily. Swallow whole.    . benazepril (LOTENSIN) 10 MG tablet Take 10 mg by mouth daily.    . carvedilol (COREG) 12.5 MG tablet Take 1 tablet (12.5 mg total) by mouth 2 (two) times daily with a meal. 180 tablet 3  . colchicine 0.6 MG tablet Take 1 tablet (0.6 mg total) by mouth daily. Future refills send to patients PCP 14 tablet 0  . finasteride (PROSCAR) 5 MG tablet Take 5 mg by mouth daily.    . furosemide (LASIX) 20 MG tablet Take 20 mg by mouth.    . melatonin 5 MG TABS Take 5 mg by mouth at bedtime.    . simvastatin (ZOCOR) 40 MG tablet TAKE ONE TABLET BY MOUTH DAILY AT 6PM (Patient taking differently: Take 40 mg by mouth every evening. ) 90 tablet 1  . temazepam (RESTORIL) 15 MG capsule Take 30 mg by mouth at  bedtime.     No current facility-administered medications for this visit.     Past Medical History:  Diagnosis Date  . Anxiety    Takes Ativan as needed  . Cardiac dysrhythmia, unspecified   . CHF (congestive heart failure) ([place])   . Coronary artery disease    Hx of cardiac caths   . Enlarged prostate   . GERD (gastroesophageal reflux disease)    Takes prilosec  . Gout   . Heart attack ([place])   . HTN (hypertension)   . Hyperkalemia   . Hyperlipemia   . Ischemic cardiomyopathy   . LV dysfunction   . Myocardial infarct ([place]) 1988  . Panic attacks   . Personal history of peptic ulcer disease   . Renal insufficiency   . Syncope and collapse   . Ventricular tachycardia [facility])     Past Surgical History:  Procedure Laterality Date  . CARDIAC CATHETERIZATION  2003  . CARDIAC DEFIBRILLATOR PLACEMENT    . COLONOSCOPY    . CORONARY ANGIOPLASTY    . CORONARY STENT INTERVENTION N/A 02/22/2019   Procedure: CORONARY STENT INTERVENTION;  Surgeon: Jettie Booze, MD;  Location: [place] CV LAB;  Service: Cardiovascular;  Laterality: N/A;  . ESOPHAGOGASTRODUODENOSCOPY     with biopsy  . HERNIA REPAIR  01/01/2012  . INSERT / REPLACE / REMOVE PACEMAKER  2005   ICD; replaced in Jan 2013  . LEAD REVISION N/A 11/08/2011   Procedure: LEAD REVISION;  Surgeon: Thompson Grayer, MD;  Location: [place] CATH LAB;  Service: Cardiovascular;  Laterality: N/A;  . LEFT HEART CATH AND CORONARY ANGIOGRAPHY N/A 02/22/2019   Procedure: LEFT HEART CATH AND CORONARY ANGIOGRAPHY;  Surgeon: Jettie Booze, MD;  Location: [place] CV LAB;  Service: Cardiovascular;  Laterality: N/A;  . UMBILICAL HERNIA REPAIR    . UMBILICAL HERNIA REPAIR  01/01/2012   Procedure: HERNIA REPAIR UMBILICAL ADULT;  Surgeon: Merrie Roof, MD;  Location: [place];  Service: General;  Laterality: N/A;  umbilical hernia repair with mesh    Social History   Socioeconomic History  . Marital status: Married    Spouse name: Not on  file  . Number of children: Not on file  . Years of education: Not on file  . Highest education level: Not on file  Occupational History  . Not on file  Tobacco Use  . Smoking status: Never Smoker  . Smokeless tobacco: Never Used  Substance and Sexual Activity  . Alcohol use: Yes    Comment: occassionally  . Drug use: No  . Sexual activity: Not Currently  Other Topics Concern  . Not on file  Social History Narrative  . Not on file   Social Determinants of Health   Financial Resource Strain:   . Difficulty of Paying Living Expenses: Not on file  Food Insecurity:   . Worried About Charity fundraiser in the Last Year: Not on file  . Ran Out of Food in the Last Year: Not on file  Transportation Needs:   . Lack of Transportation (Medical): Not on file  . Lack of Transportation (Non-Medical): Not on file  Physical Activity:   . Days of Exercise per Week: Not on file  . Minutes of Exercise per Session: Not on file  Stress:   . Feeling of Stress : Not on file  Social Connections:   . Frequency of Communication with Friends and Family: Not on file  . Frequency of Social Gatherings with Friends and Family: Not on file  . Attends Religious Services: Not on file  . Active Member of Clubs or Organizations: Not on file  . Attends Archivist Meetings: Not on file  . Marital Status: Not on file  Intimate Partner Violence:   . Fear of Current or Ex-Partner: Not on file  . Emotionally Abused: Not on file  . Physically Abused: Not on file  . Sexually Abused: Not on file    Family History  Problem Relation Age of Onset  . Heart attack Father        x2  . Heart disease Father   . Cancer Father        bladder and prostate  . Anesthesia problems Neg Hx   . Hypotension Neg Hx   . Malignant hyperthermia Neg Hx   . Pseudochol deficiency Neg Hx     ROS: no fevers or chills, productive cough, hemoptysis, dysphasia, odynophagia, melena, hematochezia, dysuria, hematuria,  rash, seizure activity, orthopnea, PND, pedal edema, claudication. Remaining systems are negative.  Physical Exam: Well-developed well-nourished in no acute distress.  Skin is warm and dry.  HEENT is normal.  Neck is supple.  Chest is clear to auscultation with normal expansion.  Cardiovascular exam is regular rate and rhythm.  Abdominal exam nontender or distended. No masses palpated. Extremities show 1+ edema. neuro grossly intact  A/P  1 chronic systolic congestive heart failure-patient appears to be reasonly well compensated from a volume standpoint.  We will continue Demadex 20  mg daily.  Check potassium and renal function.  We discussed fluid restriction and low-sodium diet.  I have not added spironolactone given history of hyperkalemia.  He has been somewhat tenuous and I will see him back in 6 to 8 weeks to follow-up.  2 ischemic cardiomyopathy-by report patient had hyperkalemia with Entresto in the past.  We will check potassium and renal function today.  If stable we will begin losartan 25 mg daily.  Advance as tolerated by blood pressure with close follow-up of potassium.  Continue carvedilol.  I have not added digoxin at this point given baseline renal insufficiency.  3 coronary artery disease-patient denies chest pain.  Continue medical therapy with aspirin and statin.  4 hypertension-blood pressure controlled.  Continue present medications and follow.  5 hyperlipidemia-continue statin.  6 prior ICD-followed by electrophysiology.  7 severe mitral regurgitation-I personally reviewed his previous echocardiogram.  Mitral regurgitation appears to be 3+.  We can consider referral for mitral clip in the future if needed.  8 chronic stage III kidney disease-recheck potassium and renal function.  Kirk Ruths, MD

## 2020-09-08 ENCOUNTER — Ambulatory Visit: Payer: Medicare PPO | Admitting: Cardiology

## 2020-09-08 ENCOUNTER — Encounter: Payer: Self-pay | Admitting: Cardiology

## 2020-09-08 ENCOUNTER — Other Ambulatory Visit: Payer: Self-pay

## 2020-09-08 VITALS — BP 100/56 | HR 64 | Ht 67.0 in | Wt 144.0 lb

## 2020-09-08 DIAGNOSIS — Z9861 Coronary angioplasty status: Secondary | ICD-10-CM

## 2020-09-08 DIAGNOSIS — I255 Ischemic cardiomyopathy: Secondary | ICD-10-CM | POA: Diagnosis not present

## 2020-09-08 DIAGNOSIS — I2510 Atherosclerotic heart disease of native coronary artery without angina pectoris: Secondary | ICD-10-CM

## 2020-09-08 DIAGNOSIS — Z95810 Presence of automatic (implantable) cardiac defibrillator: Secondary | ICD-10-CM | POA: Diagnosis not present

## 2020-09-08 DIAGNOSIS — I340 Nonrheumatic mitral (valve) insufficiency: Secondary | ICD-10-CM

## 2020-09-08 DIAGNOSIS — I5020 Unspecified systolic (congestive) heart failure: Secondary | ICD-10-CM

## 2020-09-08 LAB — BASIC METABOLIC PANEL
BUN/Creatinine Ratio: 16 (ref 10–24)
BUN: 29 mg/dL — ABNORMAL HIGH (ref 8–27)
CO2: 29 mmol/L (ref 20–29)
Calcium: 9.2 mg/dL (ref 8.6–10.2)
Chloride: 100 mmol/L (ref 96–106)
Creatinine, Ser: 1.86 mg/dL — ABNORMAL HIGH (ref 0.76–1.27)
GFR calc Af Amer: 37 mL/min/{1.73_m2} — ABNORMAL LOW (ref >59)
GFR calc non Af Amer: 32 mL/min/{1.73_m2} — ABNORMAL LOW (ref >59)
Glucose: 102 mg/dL — ABNORMAL HIGH (ref 65–99)
Potassium: 4.7 mmol/L (ref 3.5–5.2)
Sodium: 139 mmol/L (ref 134–144)

## 2020-09-08 NOTE — Patient Instructions (Signed)
  Follow-Up: At [facility], you and your health needs are our priority.  As part of our continuing mission to provide you with exceptional heart care, we have created designated Provider Care Teams.  These Care Teams include your primary Cardiologist (physician) and Advanced Practice Providers (APPs -  Physician Assistants and Nurse Practitioners) who all work together to provide you with the care you need, when you need it.  We recommend signing up for the patient portal called "MyChart".  Sign up information is provided on this After Visit Summary.  MyChart is used to connect with patients for Virtual Visits (Telemedicine).  Patients are able to view lab/test results, encounter notes, upcoming appointments, etc.  Non-urgent messages can be sent to your provider as well.   To learn more about what you can do with MyChart, go to [email or web address].    Your next appointment:   6-8 week(s)  The format for your next appointment:   In Person  Provider:   Kirk Ruths, MD

## 2020-09-08 NOTE — Addendum Note (Signed)
Addended by: Cristopher Estimable on: 09/08/2020 11:27 AM   Modules accepted: Orders

## 2020-09-11 ENCOUNTER — Ambulatory Visit (INDEPENDENT_AMBULATORY_CARE_PROVIDER_SITE_OTHER): Payer: Medicare PPO

## 2020-09-11 ENCOUNTER — Telehealth: Payer: Self-pay | Admitting: *Deleted

## 2020-09-11 DIAGNOSIS — I255 Ischemic cardiomyopathy: Secondary | ICD-10-CM

## 2020-09-11 DIAGNOSIS — I5020 Unspecified systolic (congestive) heart failure: Secondary | ICD-10-CM

## 2020-09-11 LAB — CUP PACEART REMOTE DEVICE CHECK
Battery Voltage: 2.64 V
Brady Statistic RV Percent Paced: 0.26 %
Date Time Interrogation Session: 20211115043724
HighPow Impedance: 285 Ohm
HighPow Impedance: 40 Ohm
Implantable Lead Implant Date: 20130111
Implantable Lead Location: 753860
Implantable Lead Model: 6935
Implantable Pulse Generator Implant Date: 20130111
Lead Channel Impedance Value: 361 Ohm
Lead Channel Pacing Threshold Amplitude: 0.5 V
Lead Channel Pacing Threshold Pulse Width: 0.4 ms
Lead Channel Sensing Intrinsic Amplitude: 4 mV
Lead Channel Sensing Intrinsic Amplitude: 4 mV
Lead Channel Setting Pacing Amplitude: 2.5 V
Lead Channel Setting Pacing Pulse Width: 0.4 ms
Lead Channel Setting Sensing Sensitivity: 0.3 mV

## 2020-09-11 MED ORDER — LOSARTAN POTASSIUM 25 MG PO TABS: 25.0000 mg | ORAL_TABLET | Freq: Every day | ORAL | 3 refills | Status: DC

## 2020-09-11 NOTE — Telephone Encounter (Signed)
-----   Message from Lelon Perla, MD sent at 09/08/2020  4:38 PM EST ----- Add losartan 25 mg daily; bmet one week; low K diet .Kirk Ruths

## 2020-09-11 NOTE — Telephone Encounter (Signed)
Spoke with pt, Aware of dr crenshaw's recommendations. New script sent to the pharmacy and Lab orders mailed to the pt  

## 2020-09-12 NOTE — Progress Notes (Signed)
Remote ICD transmission.   

## 2020-09-18 DIAGNOSIS — I255 Ischemic cardiomyopathy: Secondary | ICD-10-CM | POA: Diagnosis not present

## 2020-09-18 DIAGNOSIS — I5020 Unspecified systolic (congestive) heart failure: Secondary | ICD-10-CM | POA: Diagnosis not present

## 2020-09-18 LAB — BASIC METABOLIC PANEL
BUN/Creatinine Ratio: 20 (ref 10–24)
BUN: 30 mg/dL — ABNORMAL HIGH (ref 8–27)
CO2: 26 mmol/L (ref 20–29)
Calcium: 8.9 mg/dL (ref 8.6–10.2)
Chloride: 105 mmol/L (ref 96–106)
Creatinine, Ser: 1.49 mg/dL — ABNORMAL HIGH (ref 0.76–1.27)
GFR calc Af Amer: 49 mL/min/{1.73_m2} — ABNORMAL LOW (ref >59)
GFR calc non Af Amer: 42 mL/min/{1.73_m2} — ABNORMAL LOW (ref >59)
Glucose: 89 mg/dL (ref 65–99)
Potassium: 4.4 mmol/L (ref 3.5–5.2)
Sodium: 143 mmol/L (ref 134–144)

## 2020-10-04 ENCOUNTER — Other Ambulatory Visit: Payer: Self-pay | Admitting: General Practice

## 2020-10-12 ENCOUNTER — Ambulatory Visit (INDEPENDENT_AMBULATORY_CARE_PROVIDER_SITE_OTHER): Payer: Medicare PPO

## 2020-10-12 ENCOUNTER — Telehealth: Payer: Self-pay

## 2020-10-12 DIAGNOSIS — I255 Ischemic cardiomyopathy: Secondary | ICD-10-CM

## 2020-10-12 LAB — CUP PACEART REMOTE DEVICE CHECK
Battery Voltage: 2.63 V
Brady Statistic RV Percent Paced: 0.5 %
Date Time Interrogation Session: 20211216043824
HighPow Impedance: 285 Ohm
HighPow Impedance: 38 Ohm
Implantable Lead Implant Date: 20130111
Implantable Lead Location: 753860
Implantable Lead Model: 6935
Implantable Pulse Generator Implant Date: 20130111
Lead Channel Impedance Value: 342 Ohm
Lead Channel Pacing Threshold Amplitude: 0.5 V
Lead Channel Pacing Threshold Pulse Width: 0.4 ms
Lead Channel Sensing Intrinsic Amplitude: 3.75 mV
Lead Channel Sensing Intrinsic Amplitude: 3.75 mV
Lead Channel Setting Pacing Amplitude: 2.5 V
Lead Channel Setting Pacing Pulse Width: 0.4 ms
Lead Channel Setting Sensing Sensitivity: 0.3 mV

## 2020-10-12 NOTE — Telephone Encounter (Signed)
Carelink alert received for elevated Optivol since mid September and battery has reached [facility] 10/12/20. Patient reports of bilateral leg swelling "for a good while" per patient. Denies shortness of breath or issues sleeping.  Patient reports compliance with all medications including Coreg 12.5 mg BID and torsemide 20 mg daily.   Advised I forward to Dr. Lovena Le and we will call with changes. Scheduling will call to make apt. To discuss gen change. Patient agreeable to plan.

## 2020-10-13 NOTE — Telephone Encounter (Signed)
Take torsemide twice a day for 3 days. Schedule followup with me in January to discuss gen change out.

## 2020-10-16 ENCOUNTER — Telehealth (HOSPITAL_COMMUNITY): Payer: Self-pay | Admitting: Internal Medicine

## 2020-10-16 ENCOUNTER — Telehealth: Payer: Self-pay | Admitting: Cardiology

## 2020-10-16 NOTE — Telephone Encounter (Signed)
Sent mychart message advising to increase torsemide 20 mg to BID for 3 days.  Pt sees Almyra Deforest PA tomorrow.

## 2020-10-16 NOTE — Telephone Encounter (Signed)
Follow up   Pt and pts daughter are calling back  Call transferred to [facility]

## 2020-10-16 NOTE — Telephone Encounter (Signed)
Pt's daughter Marlon Pel called requesting to speak with nurse regarding pt. Daughter not listed on DPR. Nurse advised daughter to call back with pt to obtain verbal permission. Daughter verbalized understanding.

## 2020-10-16 NOTE — Telephone Encounter (Signed)
New Message  Pts daughter is calling, she is with the patient and has  Concerns. Mental fogginess, still has swelling in lower extremities. Mental confusion was one of the first things they noticed over the summer and it was showing CHF episodes from blood work. Pts daughter says he is not sleeping and having strange dreams. She is concerned and wants him checked out  She scheduled pt with PA Almyra Deforest on 12/21 but would like to talk with the nurse   Please call

## 2020-10-16 NOTE — Telephone Encounter (Signed)
Pt daughter called to f/u w/referral sent in over the summer from Dr Stanford Breed office. Haley(daughter) stated that her dads health is declining, she can be reached [phone], for appt. Thanks

## 2020-10-16 NOTE — Telephone Encounter (Signed)
Spoke to patient and daughter. Patient he is not having any chest pain , no shortness of breath no cough . ankles and feet are swollen per patient and does not go down at night , no redness or warmth  No discoloration  . Patient states he does not weight daily   Weight is usually 139 to 141 lbs. Per daughter, he is actually is losing weight,  has not taken blood pressure or heart rate.    daughter states patient has had a referral for heart failure clinic since earlier in the year , no appointment yet.  She just wanted patient to to be evaluated so they do not have an CHF episode.  RN informed patient and daughter - that his symptoms  Where not severe and that attending tomorrow appointment will be appropriate.    start keeping daily weight log and blood pressure keep appointment for tomorrow.   please sign DPR/hippa - daughter is aware

## 2020-10-17 ENCOUNTER — Telehealth (HOSPITAL_COMMUNITY): Payer: Self-pay | Admitting: Internal Medicine

## 2020-10-17 ENCOUNTER — Other Ambulatory Visit: Payer: Self-pay

## 2020-10-17 ENCOUNTER — Ambulatory Visit: Payer: Medicare PPO | Admitting: Physician Assistant

## 2020-10-17 ENCOUNTER — Encounter: Payer: Self-pay | Admitting: Physician Assistant

## 2020-10-17 VITALS — BP 96/48 | HR 66 | Ht 67.0 in | Wt 145.0 lb

## 2020-10-17 DIAGNOSIS — E785 Hyperlipidemia, unspecified: Secondary | ICD-10-CM | POA: Diagnosis not present

## 2020-10-17 DIAGNOSIS — Z95810 Presence of automatic (implantable) cardiac defibrillator: Secondary | ICD-10-CM

## 2020-10-17 DIAGNOSIS — N183 Chronic kidney disease, stage 3 unspecified: Secondary | ICD-10-CM

## 2020-10-17 DIAGNOSIS — I2510 Atherosclerotic heart disease of native coronary artery without angina pectoris: Secondary | ICD-10-CM | POA: Diagnosis not present

## 2020-10-17 DIAGNOSIS — R42 Dizziness and giddiness: Secondary | ICD-10-CM

## 2020-10-17 DIAGNOSIS — I10 Essential (primary) hypertension: Secondary | ICD-10-CM

## 2020-10-17 DIAGNOSIS — I5023 Acute on chronic systolic (congestive) heart failure: Secondary | ICD-10-CM

## 2020-10-17 NOTE — Telephone Encounter (Signed)
[facility] called to f/u with referral

## 2020-10-17 NOTE — Patient Instructions (Signed)
Medication Instructions:   HOLD Losartan   INCREASE Torsemide to 20 mg 2 times a day for 3 days, then resume 20 mg daily   *If you need a refill on your cardiac medications before your next appointment, please call your pharmacy*  Lab Work: NONE ordered at this time of appointment   If you have labs (blood work) drawn today and your tests are completely normal, you will receive your results only by: Marland Kitchen MyChart Message (if you have MyChart) OR . A paper copy in the mail If you have any lab test that is abnormal or we need to change your treatment, we will call you to review the results.  Testing/Procedures: NONE ordered at this time of appointment   Follow-Up: At [facility], you and your health needs are our priority.  As part of our continuing mission to provide you with exceptional heart care, we have created designated Provider Care Teams.  These Care Teams include your primary Cardiologist (physician) and Advanced Practice Providers (APPs -  Physician Assistants and Nurse Practitioners) who all work together to provide you with the care you need, when you need it.  Your next appointment:   3 month(s)  The format for your next appointment:   In Person  Provider:   Kirk Ruths, MD  Other Instructions

## 2020-10-17 NOTE — Progress Notes (Signed)
Cardiology Office Note:    Date:  10/19/2020   ID:  Adrian Pace, DOB February 01, 1933, MRN [id number]  PCP:  Adrian Pillar, MD  [facility] Cardiologist:  Adrian Ruths, MD  [facility] Electrophysiologist:  Adrian Peru, MD   Referring MD: Adrian Pillar, MD   Chief Complaint  Patient presents with  . Follow-up    Seen for Adrian Pace    History of Present Illness:    Adrian Pace is a 86 y.o. male with a hx of CAD, ICM, hypertension, hyperlipidemia, CKD stage III, and history of syncope.  She had PCI of LAD and RCA in 2003.  EF was 30% the time.  He eventually underwent ICD implantation.  Abdominal ultrasound in October 2014 showed no aneurysm.  Repeat cardiac catheterization in April 2020 showed patent LAD stent, 50% distal LAD, 95% distal RCA treated with DES.  Carotid Doppler in April 2020 showed normal significant disease.  ABI in July 2021 was normal.  Repeat echocardiogram in August 2021 showed EF 20 to 25%, mild LAE, mild RV dysfunction, severe pulmonary hypertension, severe MR, moderate TR, mild to moderate AI, moderately dilated ascending aorta measuring at 46 mm.  CTA in September 2021 showed dilated ascending aorta measuring at 4.2 cm, 6 mm left lung nodule.  Follow-up recommended in 6 to 12 months.  During the recent visit with Adrian Pace, low-dose losartan was added.  Recent device interrogation on 10/12/2020 revealed elevated OptiVol number.  Torsemide was increased to 20 mg twice daily for 3 days before going back to the previous dose.  Patient presents today for follow-up.  Even after 3 days of increased torsemide, he continued to have at least 2+ pitting edema in bilateral lower extremity.  Fortunately his lung is clear on exam.  According to the daughter, his mind has been foggy since starting on the losartan.  Blood pressure is currently low in 96.  Given the mild mental status changes recently, I am concerned that that his low blood pressure is causing the  issue.  I recommended temporary hold on the losartan for the time being.  Given the bilateral lower extremity edema, I recommended increase torsemide to 20 mg twice daily for 3 days again before going back down to the lower dose.  He is scheduled to see Dr. Lovena Pace in early January to discuss device change out as his current ICD has reached ERI.  Otherwise he denies any chest pain.  He has chronic shortness of breath with exertion and does not do much activity at home.   Past Medical History:  Diagnosis Date  . Anxiety    Takes Ativan as needed  . Cardiac dysrhythmia, unspecified   . CHF (congestive heart failure) ([place])   . Coronary artery disease    Hx of cardiac caths   . Enlarged prostate   . GERD (gastroesophageal reflux disease)    Takes prilosec  . Gout   . Heart attack ([place])   . HTN (hypertension)   . Hyperkalemia   . Hyperlipemia   . Ischemic cardiomyopathy   . LV dysfunction   . Myocardial infarct ([place]) 1988  . Panic attacks   . Personal history of peptic ulcer disease   . Renal insufficiency   . Syncope and collapse   . Ventricular tachycardia [facility])     Past Surgical History:  Procedure Laterality Date  . CARDIAC CATHETERIZATION  2003  . CARDIAC DEFIBRILLATOR PLACEMENT    . COLONOSCOPY    . CORONARY ANGIOPLASTY    .  CORONARY STENT INTERVENTION N/A 02/22/2019   Procedure: CORONARY STENT INTERVENTION;  Surgeon: Jettie Booze, MD;  Location: [place] CV LAB;  Service: Cardiovascular;  Laterality: N/A;  . ESOPHAGOGASTRODUODENOSCOPY     with biopsy  . HERNIA REPAIR  01/01/2012  . INSERT / REPLACE / REMOVE PACEMAKER  2005   ICD; replaced in Jan 2013  . LEAD REVISION N/A 11/08/2011   Procedure: LEAD REVISION;  Surgeon: Thompson Grayer, MD;  Location: [facility] CATH LAB;  Service: Cardiovascular;  Laterality: N/A;  . LEFT HEART CATH AND CORONARY ANGIOGRAPHY N/A 02/22/2019   Procedure: LEFT HEART CATH AND CORONARY ANGIOGRAPHY;  Surgeon: Jettie Booze, MD;  Location: [place] CV LAB;  Service: Cardiovascular;  Laterality: N/A;  . UMBILICAL HERNIA REPAIR    . UMBILICAL HERNIA REPAIR  01/01/2012   Procedure: HERNIA REPAIR UMBILICAL ADULT;  Surgeon: Merrie Roof, MD;  Location: [place];  Service: General;  Laterality: N/A;  umbilical hernia repair with mesh    Current Medications: Current Meds  Medication Sig  . aspirin EC 81 MG tablet Take 81 mg by mouth daily. Swallow whole.  . carvedilol (COREG) 12.5 MG tablet Take 1 tablet (12.5 mg total) by mouth 2 (two) times daily with a meal.  . colchicine 0.6 MG tablet Take 1 tablet (0.6 mg total) by mouth daily. Future refills send to patients PCP  . finasteride (PROSCAR) 5 MG tablet Take 5 mg by mouth daily.  Marland Kitchen losartan (COZAAR) 25 MG tablet Take 1 tablet (25 mg total) by mouth daily.  . melatonin 5 MG TABS Take 5 mg by mouth at bedtime.  . simvastatin (ZOCOR) 40 MG tablet TAKE ONE TABLET BY MOUTH DAILY AT 6 IN THE EVENING  . temazepam (RESTORIL) 15 MG capsule Take 30 mg by mouth at bedtime.  . torsemide (DEMADEX) 20 MG tablet Take 20 mg by mouth daily.     Allergies:   Entresto [sacubitril-valsartan], Niacin and related, and Codeine   Social History   Socioeconomic History  . Marital status: Married    Spouse name: Not on file  . Number of children: Not on file  . Years of education: Not on file  . Highest education level: Not on file  Occupational History  . Not on file  Tobacco Use  . Smoking status: Never Smoker  . Smokeless tobacco: Never Used  Substance and Sexual Activity  . Alcohol use: Yes    Comment: occassionally  . Drug use: No  . Sexual activity: Not Currently  Other Topics Concern  . Not on file  Social History Narrative  . Not on file   Social Determinants of Health   Financial Resource Strain: Not on file  Food Insecurity: Not on file  Transportation Needs: Not on file  Physical Activity: Not on file  Stress: Not on file  Social Connections: Not on file     Family  History: The patient's family history includes Cancer in his father; Heart attack in his father; Heart disease in his father. There is no history of Anesthesia problems, Hypotension, Malignant hyperthermia, or Pseudochol deficiency.  ROS:   Please see the history of present illness.     All other systems reviewed and are negative.  EKGs/Labs/Other Studies Reviewed:    The following studies were reviewed today:  Echo 06/16/2020 1. Akinesis of the anteroseptal, apical and inferior walls.  2. Left ventricular ejection fraction, by estimation, is 20 to 25%. The  left ventricle has severely decreased function.  The left ventricle  demonstrates regional wall motion abnormalities (see scoring  diagram/findings for description). The left  ventricular internal cavity size was mildly dilated. Left ventricular  diastolic parameters are consistent with Grade III diastolic dysfunction  (restrictive). Elevated left atrial pressure.  3. Right ventricular systolic function is mildly reduced. The right  ventricular size is mildly enlarged. There is severely elevated pulmonary  artery systolic pressure.  4. Left atrial size was mildly dilated.  5. Right atrial size was severely dilated.  6. The mitral valve is normal in structure. Severe mitral valve  regurgitation. No evidence of mitral stenosis.  7. Tricuspid valve regurgitation is moderate.  8. The aortic valve is tricuspid. Aortic valve regurgitation is mild to  moderate. Mild aortic valve sclerosis is present, with no evidence of  aortic valve stenosis.  9. Aortic dilatation noted. 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  10. The inferior vena cava is dilated in size with <50% respiratory  variability, suggesting right atrial pressure of 15 mmHg.   Comparison(s): 02/21/19 EF 20-25%.   EKG:  EKG is not ordered today.    Recent Labs: 04/22/2020: ALT 23; Hemoglobin 13.0; Magnesium 2.2; Platelets  130 04/24/2020: NT-Pro BNP 17,077 04/27/2020: BNP 1,877.0 09/18/2020: BUN 30; Creatinine, Ser 1.49; Potassium 4.4; Sodium 143  Recent Lipid Panel    Component Value Date/Time   CHOL 97 (L) 01/03/2020 1126   TRIG 87 01/03/2020 1126   HDL 32 (L) 01/03/2020 1126   CHOLHDL 3.0 01/03/2020 1126   CHOLHDL 3.4 02/21/2019 0226   VLDL 17 02/21/2019 0226   LDLCALC 48 01/03/2020 1126     Risk Assessment/Calculations:       Physical Exam:    VS:  BP (!) 96/48 (BP Location: Left Arm, Patient Position: Sitting, Cuff Size: Normal)   Pulse 66   Ht 5\' 7"  (1.702 m)   Wt 145 lb (65.8 kg)   BMI 22.71 kg/m     Wt Readings from Last 3 Encounters:  10/17/20 145 lb (65.8 kg)  09/08/20 144 lb (65.3 kg)  08/16/20 149 lb 9.6 oz (67.9 kg)     GEN:  Well nourished, well developed in no acute distress HEENT: Normal NECK: No JVD; No carotid bruits LYMPHATICS: No lymphadenopathy CARDIAC: RRR, no murmurs, rubs, gallops RESPIRATORY:  Clear to auscultation without rales, wheezing or rhonchi  ABDOMEN: Soft, non-tender, non-distended MUSCULOSKELETAL:  No edema; No deformity  SKIN: Warm and dry NEUROLOGIC:  Alert and oriented x 3 PSYCHIATRIC:  Normal affect   ASSESSMENT:    1. Acute on chronic systolic heart failure ([place])   2. Dizziness   3. Coronary artery disease involving native coronary artery of native heart without angina pectoris   4. Essential hypertension   5. Hyperlipidemia LDL goal <70   6. Stage 3 chronic kidney disease, unspecified whether stage 3a or 3b CKD ([place])   7. ICD (implantable cardioverter-defibrillator) in place    PLAN:    In order of problems listed above:  1. Acute on chronic systolic heart failure: Given despite the recent increase of diuretic, he continued to have at least 2+ pitting edema in the lower extremity.  I recommended higher dose of torsemide for 3 more days before going back to the 20 mg daily of torsemide  2. Dizziness: His daughter has been noticing some  altered mental status recently.  Patient says his mind has been foggy since starting on the losartan during the last visit.  Blood pressure has  been borderline low.  I recommended hold losartan for the time being 3. CAD: Denies any recent chest pain  4. Hypertension: Blood pressure borderline low recently.  With mild altered mental status, I decided to reduce losartan  5. Hyperlipidemia: On Zocor  6. CKD stage III: Stable on last lab work  7. Presence of ICD: Due for device change out.  Has visit with EP service next month.        Medication Adjustments/Labs and Tests Ordered: Current medicines are reviewed at length with the patient today.  Concerns regarding medicines are outlined above.  No orders of the defined types were placed in this encounter.  No orders of the defined types were placed in this encounter.   Patient Instructions  Medication Instructions:   HOLD Losartan   INCREASE Torsemide to 20 mg 2 times a day for 3 days, then resume 20 mg daily   *If you need a refill on your cardiac medications before your next appointment, please call your pharmacy*  Lab Work: NONE ordered at this time of appointment   If you have labs (blood work) drawn today and your tests are completely normal, you will receive your results only by: Marland Kitchen MyChart Message (if you have MyChart) OR . A paper copy in the mail If you have any lab test that is abnormal or we need to change your treatment, we will call you to review the results.  Testing/Procedures: NONE ordered at this time of appointment   Follow-Up: At [facility], you and your health needs are our priority.  As part of our continuing mission to provide you with exceptional heart care, we have created designated Provider Care Teams.  These Care Teams include your primary Cardiologist (physician) and Advanced Practice Providers (APPs -  Physician Assistants and Nurse Practitioners) who all work together to provide you with the  care you need, when you need it.  Your next appointment:   3 month(s)  The format for your next appointment:   In Person  Provider:   Kirk Ruths, MD  Other Instructions      Signed, Almyra Deforest, [place]  10/19/2020 10:17 PM    [place]

## 2020-10-18 DIAGNOSIS — I129 Hypertensive chronic kidney disease with stage 1 through stage 4 chronic kidney disease, or unspecified chronic kidney disease: Secondary | ICD-10-CM | POA: Diagnosis not present

## 2020-10-18 DIAGNOSIS — I119 Hypertensive heart disease without heart failure: Secondary | ICD-10-CM | POA: Diagnosis not present

## 2020-10-18 DIAGNOSIS — I5022 Chronic systolic (congestive) heart failure: Secondary | ICD-10-CM | POA: Diagnosis not present

## 2020-10-18 DIAGNOSIS — Z0000 Encounter for general adult medical examination without abnormal findings: Secondary | ICD-10-CM | POA: Diagnosis not present

## 2020-10-18 DIAGNOSIS — F419 Anxiety disorder, unspecified: Secondary | ICD-10-CM | POA: Diagnosis not present

## 2020-10-18 DIAGNOSIS — G4700 Insomnia, unspecified: Secondary | ICD-10-CM | POA: Diagnosis not present

## 2020-10-18 DIAGNOSIS — I2510 Atherosclerotic heart disease of native coronary artery without angina pectoris: Secondary | ICD-10-CM | POA: Diagnosis not present

## 2020-10-18 DIAGNOSIS — N400 Enlarged prostate without lower urinary tract symptoms: Secondary | ICD-10-CM | POA: Diagnosis not present

## 2020-10-18 DIAGNOSIS — Z1389 Encounter for screening for other disorder: Secondary | ICD-10-CM | POA: Diagnosis not present

## 2020-10-19 ENCOUNTER — Encounter: Payer: Self-pay | Admitting: Physician Assistant

## 2020-10-23 DIAGNOSIS — R972 Elevated prostate specific antigen [PSA]: Secondary | ICD-10-CM | POA: Diagnosis not present

## 2020-10-23 NOTE — Telephone Encounter (Signed)
appt sch °

## 2020-10-26 NOTE — Progress Notes (Signed)
HPI: FU coronary disease. He had PCI of his LAD and right coronary artery2003. His ejection fraction was 30%.Has ICD.Abd ultrasound 10/14 showed no aneurysm. Cardiac catheterization April 2020 showed patent LAD stent, 50% distal LAD, 95% distal right coronary artery which was treated with a drug-eluting stent. Carotid Dopplers April 2020 showed no significant obstruction. ABIs July 2021 normal.  Last echocardiogram August 2021 showed ejection fraction 20 to 25%, mild left ventricular enlargement, restrictive filling, mild RV dysfunction, mild right ventricular enlargement, severe pulmonary hypertension, biatrial enlargement, severe mitral regurgitation, moderate tricuspid regurgitation, mild to moderate aortic insufficiency and moderately dilated ascending aorta at 46 mm.  CTA September 2021 showed dilated ascending aorta measuring 4.2 cm; 6 mm left lung nodule and follow-up recommended in 6 to 12 months. Since I last saw him,he denies dyspnea, chest pain or syncope.  He does have some pedal edema.  He also describes some fatigue.  He apparently feels "foggy" when his blood pressure is low.  Current Outpatient Medications  Medication Sig Dispense Refill  . aspirin EC 81 MG tablet Take 81 mg by mouth daily. Swallow whole.    . carvedilol (COREG) 12.5 MG tablet Take 1 tablet (12.5 mg total) by mouth 2 (two) times daily with a meal. 180 tablet 3  . melatonin 5 MG TABS Take 5 mg by mouth at bedtime.    . simvastatin (ZOCOR) 40 MG tablet TAKE ONE TABLET BY MOUTH DAILY AT 6 IN THE EVENING 90 tablet 1  . Suvorexant (BELSOMRA) 20 MG TABS Take by mouth.    . temazepam (RESTORIL) 15 MG capsule Take 30 mg by mouth at bedtime.    . torsemide (DEMADEX) 20 MG tablet Take 20 mg by mouth daily.     No current facility-administered medications for this visit.     Past Medical History:  Diagnosis Date  . Anxiety    Takes Ativan as needed  . Cardiac dysrhythmia, unspecified   . CHF (congestive heart  failure) ([facility])   . Coronary artery disease    Hx of cardiac caths   . Enlarged prostate   . GERD (gastroesophageal reflux disease)    Takes prilosec  . Gout   . Heart attack ([facility])   . HTN (hypertension)   . Hyperkalemia   . Hyperlipemia   . Ischemic cardiomyopathy   . LV dysfunction   . Myocardial infarct ([facility]) 1988  . Panic attacks   . Personal history of peptic ulcer disease   . Renal insufficiency   . Syncope and collapse   . Ventricular tachycardia [facility])     Past Surgical History:  Procedure Laterality Date  . CARDIAC CATHETERIZATION  2003  . CARDIAC DEFIBRILLATOR PLACEMENT    . COLONOSCOPY    . CORONARY ANGIOPLASTY    . CORONARY STENT INTERVENTION N/A 02/22/2019   Procedure: CORONARY STENT INTERVENTION;  Surgeon: Corky Crafts, MD;  [facility] INVASIVE CV LAB;  Service: Cardiovascular;  Laterality: N/A;  . ESOPHAGOGASTRODUODENOSCOPY     with biopsy  . HERNIA REPAIR  01/01/2012  . INSERT / REPLACE / REMOVE PACEMAKER  2005   ICD; replaced in Jan 2013  . LEAD REVISION N/A 11/08/2011   Procedure: LEAD REVISION;  Surgeon: Hillis Range, MD;  Location: [facility] CATH LAB;  Service: Cardiovascular;  Laterality: N/A;  . LEFT HEART CATH AND CORONARY ANGIOGRAPHY N/A 02/22/2019   Procedure: LEFT HEART CATH AND CORONARY ANGIOGRAPHY;  Surgeon: Corky Crafts, MD;  Location: [facility] INVASIVE CV LAB;  Service: Cardiovascular;  Laterality: N/A;  . UMBILICAL HERNIA REPAIR    . UMBILICAL HERNIA REPAIR  01/01/2012   Procedure: HERNIA REPAIR UMBILICAL ADULT;  Surgeon: Merrie Roof, MD;  Location: [place];  Service: General;  Laterality: N/A;  umbilical hernia repair with mesh    Social History   Socioeconomic History  . Marital status: Married    Spouse name: Not on file  . Number of children: Not on file  . Years of education: Not on file  . Highest education level: Not on file  Occupational History  . Not on file  Tobacco Use  . Smoking status: Never Smoker  . Smokeless  tobacco: Never Used  Substance and Sexual Activity  . Alcohol use: Yes    Comment: occassionally  . Drug use: No  . Sexual activity: Not Currently  Other Topics Concern  . Not on file  Social History Narrative  . Not on file   Social Determinants of Health   Financial Resource Strain: Not on file  Food Insecurity: Not on file  Transportation Needs: Not on file  Physical Activity: Not on file  Stress: Not on file  Social Connections: Not on file  Intimate Partner Violence: Not on file    Family History  Problem Relation Age of Onset  . Heart attack Father        x2  . Heart disease Father   . Cancer Father        bladder and prostate  . Anesthesia problems Neg Hx   . Hypotension Neg Hx   . Malignant hyperthermia Neg Hx   . Pseudochol deficiency Neg Hx     ROS: no fevers or chills, productive cough, hemoptysis, dysphasia, odynophagia, melena, hematochezia, dysuria, hematuria, rash, seizure activity, orthopnea, PND, claudication. Remaining systems are negative.  Physical Exam: Well-developed somewhat frail in no acute distress.  Skin is warm and dry.  HEENT is normal.  Neck is supple.  Chest is clear to auscultation with normal expansion.  Cardiovascular exam is regular rate and rhythm.  Abdominal exam nontender or distended. No masses palpated. Extremities show 1+ edema. neuro grossly intact  A/P  1 chronic systolic congestive heart failure-patient describes worsening pedal edema.  We will increase Demadex to 20 mg in the morning and 10 in the afternoon.  Check potassium and renal function in 1 week.  We have not added spironolactone given history of hyperkalemia. Continue fluid restriction and low-sodium diet.  2 ischemic cardiomyopathy-patient seen by Almyra Deforest recently and blood pressure low.  ARB was held.  Continue carvedilol.  We will consider resuming ARB or adding hydralazine/nitrates in the future if blood pressure allows.  He feels as though losartan caused  a "fog".  3 coronary artery disease-continue aspirin and statin.  4 hypertension-patient's blood pressure is controlled.  Continue present medical regimen.  5 hyperlipidemia-continue statin.  6 prior ICD-patient has decided not to have his generator changed.  He also requests no CODE BLUE.  7 history of mitral regurgitation-as outlined in previous notes I felt that his mitral regurgitation is in the 3+ range.  Can consider referral for mitral clip in the future if needed.  8 chronic stage III kidney disease-check potassium and renal function.  Kirk Ruths, MD

## 2020-10-26 NOTE — Progress Notes (Signed)
Remote ICD transmission.   

## 2020-10-30 DIAGNOSIS — R310 Gross hematuria: Secondary | ICD-10-CM | POA: Diagnosis not present

## 2020-10-31 ENCOUNTER — Ambulatory Visit: Payer: Medicare PPO | Admitting: Internal Medicine

## 2020-10-31 ENCOUNTER — Other Ambulatory Visit: Payer: Self-pay

## 2020-10-31 ENCOUNTER — Encounter: Payer: Self-pay | Admitting: Internal Medicine

## 2020-10-31 DIAGNOSIS — I255 Ischemic cardiomyopathy: Secondary | ICD-10-CM

## 2020-10-31 NOTE — Patient Instructions (Addendum)
Medication Instructions:  Your physician recommends that you continue on your current medications as directed. Please refer to the Current Medication list given to you today.  Labwork: None ordered.  Testing/Procedures: None ordered.  Follow-Up:  Please contact me if you would like to schedule a battery change for your ICD   Any Other Special Instructions Will Be Listed Below (If Applicable).  If you need a refill on your cardiac medications before your next appointment, please call your pharmacy.

## 2020-10-31 NOTE — Progress Notes (Signed)
HPI Adrian Pace returns today for followup. He is a pleasant 87 yo man with a h/o an ICM, s/p MI, chronic systolic heart failure, and VT, s/p ICD insertion. He admits to feeling weak. He notes that his bp is elevated at times as well as a little low at others. He denies anginal symptoms. He has reached ERI.  Allergies  Allergen Reactions  . Entresto [Sacubitril-Valsartan] Other (See Comments)    hyperkalemia  . Niacin And Related Itching and Rash  . Serotonin Reuptake Inhibitors (Ssris) Other (See Comments)  . Sulfamethoxazole-Trimethoprim Other (See Comments)  . Codeine Anxiety     Current Outpatient Medications  Medication Sig Dispense Refill  . aspirin EC 81 MG tablet Take 81 mg by mouth daily. Swallow whole.    . carvedilol (COREG) 12.5 MG tablet Take 1 tablet (12.5 mg total) by mouth 2 (two) times daily with a meal. 180 tablet 3  . melatonin 5 MG TABS Take 5 mg by mouth at bedtime.    . simvastatin (ZOCOR) 40 MG tablet TAKE ONE TABLET BY MOUTH DAILY AT 6 IN THE EVENING 90 tablet 1  . Suvorexant (BELSOMRA) 20 MG TABS Take by mouth.    . temazepam (RESTORIL) 15 MG capsule Take 30 mg by mouth at bedtime.    . torsemide (DEMADEX) 20 MG tablet Take 20 mg by mouth daily.     No current facility-administered medications for this visit.     Past Medical History:  Diagnosis Date  . Anxiety    Takes Ativan as needed  . Cardiac dysrhythmia, unspecified   . CHF (congestive heart failure) ([place])   . Coronary artery disease    Hx of cardiac caths   . Enlarged prostate   . GERD (gastroesophageal reflux disease)    Takes prilosec  . Gout   . Heart attack ([place])   . HTN (hypertension)   . Hyperkalemia   . Hyperlipemia   . Ischemic cardiomyopathy   . LV dysfunction   . Myocardial infarct ([place]) 1988  . Panic attacks   . Personal history of peptic ulcer disease   . Renal insufficiency   . Syncope and collapse   . Ventricular tachycardia ([place])     ROS:   All systems  reviewed and negative except as noted in the HPI.   Past Surgical History:  Procedure Laterality Date  . CARDIAC CATHETERIZATION  2003  . CARDIAC DEFIBRILLATOR PLACEMENT    . COLONOSCOPY    . CORONARY ANGIOPLASTY    . CORONARY STENT INTERVENTION N/A 02/22/2019   Procedure: CORONARY STENT INTERVENTION;  Surgeon: Jettie Booze, MD;  Location: [place] [facility];  Service: Cardiovascular;  Laterality: N/A;  . ESOPHAGOGASTRODUODENOSCOPY     with biopsy  . HERNIA REPAIR  01/01/2012  . INSERT / REPLACE / REMOVE PACEMAKER  2005   ICD; replaced in Jan 2013  . LEAD REVISION N/A 11/08/2011   Procedure: LEAD REVISION;  Surgeon: Thompson Grayer, MD;  Location: [facility];  Service: Cardiovascular;  Laterality: N/A;  . LEFT HEART CATH AND CORONARY ANGIOGRAPHY N/A 02/22/2019   Procedure: LEFT HEART CATH AND CORONARY ANGIOGRAPHY;  Surgeon: Jettie Booze, MD;  Location: [place] [facility];  Service: Cardiovascular;  Laterality: N/A;  . UMBILICAL HERNIA REPAIR    . UMBILICAL HERNIA REPAIR  01/01/2012   Procedure: HERNIA REPAIR UMBILICAL ADULT;  Surgeon: Merrie Roof, MD;  Location: [place];  Service: General;  Laterality: N/A;  umbilical  hernia repair with mesh     Family History  Problem Relation Age of Onset  . Heart attack Father        x2  . Heart disease Father   . Cancer Father        bladder and prostate  . Anesthesia problems Neg Hx   . Hypotension Neg Hx   . Malignant hyperthermia Neg Hx   . Pseudochol deficiency Neg Hx      Social History   Socioeconomic History  . Marital status: Married    Spouse name: Not on file  . Number of children: Not on file  . Years of education: Not on file  . Highest education level: Not on file  Occupational History  . Not on file  Tobacco Use  . Smoking status: Never Smoker  . Smokeless tobacco: Never Used  Substance and Sexual Activity  . Alcohol use: Yes    Comment: occassionally  . Drug use: No  . Sexual activity: Not  Currently  Other Topics Concern  . Not on file  Social History Narrative  . Not on file   Social Determinants of Health   Financial Resource Strain: Not on file  Food Insecurity: Not on file  Transportation Needs: Not on file  Physical Activity: Not on file  Stress: Not on file  Social Connections: Not on file  Intimate Partner Violence: Not on file     Ht 5\' 7"  (1.702 m)   BMI 22.71 kg/m   Physical Exam:  frail appearing NAD HEENT: Unremarkable Neck:  No JVD, no thyromegally Lymphatics:  No adenopathy Back:  No CVA tenderness Lungs:  Clear with no wheezes HEART:  Regular rate rhythm, no murmurs, no rubs, no clicks Abd:  soft, positive bowel sounds, no organomegally, no rebound, no guarding Ext:  2 plus pulses, no edema, no cyanosis, no clubbing Skin:  No rashes no nodules Neuro:  CN II through XII intact, motor grossly intact  DEVICE  Normal device function.  See PaceArt for details. ERI.  Assess/Plan: 1. Chronic systolic heart failure - his symptoms are class 2B but mostly low output. He will continue his current meds. Dr. will adjust his meds 2. VT - he has not had any additional ICD therapies. 3. ICD - he has reached ERI. We discussed the pro's and con's of ICD gen change out and he will call Adrian Pace if he would like to schedule. 4. CAD - he denies anginal symptoms. He is encouraged to increase his activity.

## 2020-11-02 ENCOUNTER — Ambulatory Visit (INDEPENDENT_AMBULATORY_CARE_PROVIDER_SITE_OTHER): Payer: Medicare PPO | Admitting: Cardiology

## 2020-11-02 ENCOUNTER — Other Ambulatory Visit: Payer: Self-pay

## 2020-11-02 ENCOUNTER — Ambulatory Visit: Payer: Medicare PPO | Admitting: Cardiology

## 2020-11-02 ENCOUNTER — Encounter: Payer: Self-pay | Admitting: Cardiology

## 2020-11-02 VITALS — BP 130/63 | HR 84 | Temp 95.5°F | Ht 65.0 in | Wt 149.6 lb

## 2020-11-02 DIAGNOSIS — I5023 Acute on chronic systolic (congestive) heart failure: Secondary | ICD-10-CM | POA: Diagnosis not present

## 2020-11-02 DIAGNOSIS — I10 Essential (primary) hypertension: Secondary | ICD-10-CM | POA: Diagnosis not present

## 2020-11-02 DIAGNOSIS — I255 Ischemic cardiomyopathy: Secondary | ICD-10-CM | POA: Diagnosis not present

## 2020-11-02 DIAGNOSIS — E785 Hyperlipidemia, unspecified: Secondary | ICD-10-CM | POA: Diagnosis not present

## 2020-11-02 DIAGNOSIS — I2510 Atherosclerotic heart disease of native coronary artery without angina pectoris: Secondary | ICD-10-CM | POA: Diagnosis not present

## 2020-11-02 MED ORDER — TORSEMIDE 10 MG PO TABS: ORAL_TABLET | ORAL | 3 refills | Status: DC

## 2020-11-02 NOTE — Patient Instructions (Signed)
Medication Instructions:   TAKE 10 MG OF TORSEMIDE ONCE DAILY IN THE EARLY AFTERNOON  *If you need a refill on your cardiac medications before your next appointment, please call your pharmacy*   Lab Work:  Your physician recommends that you return for lab work in:ONE WEEK  If you have labs (blood work) drawn today and your tests are completely normal, you will receive your results only by: Marland Kitchen MyChart Message (if you have MyChart) OR . A paper copy in the mail If you have any lab test that is abnormal or we need to change your treatment, we will call you to review the results.    Follow-Up: At [facility] you and your health needs are our priority.  As part of our continuing mission to provide you with exceptional heart care, we have created designated Provider Care Teams.  These Care Teams include your primary Cardiologist (physician) and Advanced Practice Providers (APPs -  Physician Assistants and Nurse Practitioners) who all work together to provide you with the care you need, when you need it.  We recommend signing up for the patient portal called "MyChart".  Sign up information is provided on this After Visit Summary.  MyChart is used to connect with patients for Virtual Visits (Telemedicine).  Patients are able to view lab/test results, encounter notes, upcoming appointments, etc.  Non-urgent messages can be sent to your provider as well.   To learn more about what you can do with MyChart, go to [email or web address].    Your next appointment:   6-8 week(s)  The format for your next appointment:   In Person  Provider:   Olga Millers, MD

## 2020-11-13 ENCOUNTER — Ambulatory Visit (INDEPENDENT_AMBULATORY_CARE_PROVIDER_SITE_OTHER): Payer: Medicare PPO

## 2020-11-13 DIAGNOSIS — I255 Ischemic cardiomyopathy: Secondary | ICD-10-CM

## 2020-11-13 LAB — CUP PACEART REMOTE DEVICE CHECK
Battery Voltage: 2.64 V
Brady Statistic RV Percent Paced: 0.73 %
Date Time Interrogation Session: 20220117082605
HighPow Impedance: 285 Ohm
HighPow Impedance: 34 Ohm
Implantable Lead Implant Date: 20130111
Implantable Lead Location: 753860
Implantable Lead Model: 6935
Implantable Pulse Generator Implant Date: 20130111
Lead Channel Impedance Value: 342 Ohm
Lead Channel Pacing Threshold Amplitude: 0.5 V
Lead Channel Pacing Threshold Pulse Width: 0.4 ms
Lead Channel Sensing Intrinsic Amplitude: 4.125 mV
Lead Channel Sensing Intrinsic Amplitude: 4.125 mV
Lead Channel Setting Pacing Amplitude: 2.5 V
Lead Channel Setting Pacing Pulse Width: 0.4 ms
Lead Channel Setting Sensing Sensitivity: 0.3 mV

## 2020-11-21 DIAGNOSIS — I5022 Chronic systolic (congestive) heart failure: Secondary | ICD-10-CM | POA: Diagnosis not present

## 2020-11-21 DIAGNOSIS — I129 Hypertensive chronic kidney disease with stage 1 through stage 4 chronic kidney disease, or unspecified chronic kidney disease: Secondary | ICD-10-CM | POA: Diagnosis not present

## 2020-11-21 DIAGNOSIS — R233 Spontaneous ecchymoses: Secondary | ICD-10-CM | POA: Diagnosis not present

## 2020-11-21 DIAGNOSIS — N183 Chronic kidney disease, stage 3 unspecified: Secondary | ICD-10-CM | POA: Diagnosis not present

## 2020-11-21 DIAGNOSIS — D649 Anemia, unspecified: Secondary | ICD-10-CM | POA: Diagnosis not present

## 2020-11-23 DIAGNOSIS — G4700 Insomnia, unspecified: Secondary | ICD-10-CM | POA: Diagnosis not present

## 2020-11-25 NOTE — Progress Notes (Signed)
Remote ICD transmission.   

## 2020-12-07 ENCOUNTER — Encounter (HOSPITAL_COMMUNITY): Payer: Self-pay | Admitting: Internal Medicine

## 2020-12-07 ENCOUNTER — Ambulatory Visit (HOSPITAL_COMMUNITY)
Admission: RE | Admit: 2020-12-07 | Discharge: 2020-12-07 | Disposition: A | Discharge status: Home / Self Care | Payer: Medicare PPO | Source: Ambulatory Visit | Attending: Internal Medicine | Admitting: Internal Medicine

## 2020-12-07 ENCOUNTER — Other Ambulatory Visit: Payer: Self-pay

## 2020-12-07 VITALS — BP 102/58 | HR 64 | Wt 153.4 lb

## 2020-12-07 DIAGNOSIS — N1832 Chronic kidney disease, stage 3b: Secondary | ICD-10-CM | POA: Insufficient documentation

## 2020-12-07 DIAGNOSIS — I272 Pulmonary hypertension, unspecified: Secondary | ICD-10-CM | POA: Insufficient documentation

## 2020-12-07 DIAGNOSIS — I255 Ischemic cardiomyopathy: Secondary | ICD-10-CM | POA: Insufficient documentation

## 2020-12-07 DIAGNOSIS — N183 Chronic kidney disease, stage 3 unspecified: Secondary | ICD-10-CM | POA: Diagnosis not present

## 2020-12-07 DIAGNOSIS — I5023 Acute on chronic systolic (congestive) heart failure: Secondary | ICD-10-CM | POA: Insufficient documentation

## 2020-12-07 DIAGNOSIS — I252 Old myocardial infarction: Secondary | ICD-10-CM | POA: Diagnosis not present

## 2020-12-07 DIAGNOSIS — I130 Hypertensive heart and chronic kidney disease with heart failure and stage 1 through stage 4 chronic kidney disease, or unspecified chronic kidney disease: Secondary | ICD-10-CM | POA: Insufficient documentation

## 2020-12-07 DIAGNOSIS — I5022 Chronic systolic (congestive) heart failure: Secondary | ICD-10-CM | POA: Diagnosis not present

## 2020-12-07 DIAGNOSIS — I2510 Atherosclerotic heart disease of native coronary artery without angina pectoris: Secondary | ICD-10-CM

## 2020-12-07 DIAGNOSIS — Z79899 Other long term (current) drug therapy: Secondary | ICD-10-CM | POA: Insufficient documentation

## 2020-12-07 DIAGNOSIS — Z8249 Family history of ischemic heart disease and other diseases of the circulatory system: Secondary | ICD-10-CM | POA: Insufficient documentation

## 2020-12-07 DIAGNOSIS — I340 Nonrheumatic mitral (valve) insufficiency: Secondary | ICD-10-CM

## 2020-12-07 DIAGNOSIS — Z66 Do not resuscitate: Secondary | ICD-10-CM | POA: Diagnosis not present

## 2020-12-07 DIAGNOSIS — I454 Nonspecific intraventricular block: Secondary | ICD-10-CM | POA: Diagnosis not present

## 2020-12-07 DIAGNOSIS — Z955 Presence of coronary angioplasty implant and graft: Secondary | ICD-10-CM | POA: Insufficient documentation

## 2020-12-07 DIAGNOSIS — Z7982 Long term (current) use of aspirin: Secondary | ICD-10-CM | POA: Diagnosis not present

## 2020-12-07 LAB — BASIC METABOLIC PANEL
Anion gap: 8 (ref 5–15)
BUN: 33 mg/dL — ABNORMAL HIGH (ref 8–23)
CO2: 27 mmol/L (ref 22–32)
Calcium: 8.4 mg/dL — ABNORMAL LOW (ref 8.9–10.3)
Chloride: 106 mmol/L (ref 98–111)
Creatinine, Ser: 1.84 mg/dL — ABNORMAL HIGH (ref 0.61–1.24)
GFR, Estimated: 35 mL/min — ABNORMAL LOW (ref >60)
Glucose, Bld: 136 mg/dL — ABNORMAL HIGH (ref 70–99)
Potassium: 3.8 mmol/L (ref 3.5–5.1)
Sodium: 141 mmol/L (ref 135–145)

## 2020-12-07 LAB — BRAIN NATRIURETIC PEPTIDE: B Natriuretic Peptide: 3445.9 pg/mL — ABNORMAL HIGH (ref 0.0–100.0)

## 2020-12-07 MED ORDER — POTASSIUM CHLORIDE CRYS ER 20 MEQ PO TBCR: 20.0000 meq | EXTENDED_RELEASE_TABLET | Freq: Two times a day (BID) | ORAL | 3 refills | Status: DC

## 2020-12-07 NOTE — Consult Note (Signed)
ADVANCED HF CLINIC CONSULT NOTE  Referring Physician: Dr. Stanford Breed Primary Care: Kelton Pillar, MD Primary Cardiologist: Kirk Ruths, MD   HPI:  Adrian Pace is an 87 y.o. male with a hx of CAD, CKD IIIb and systolic HF due to severe iCM referred by Dr. Stanford Breed for further evaluation of his advanced HF.  He had his first heart attack ~ 1988. He had PCI of LAD and RCA in 2003.  EF was 30% the time.  He eventually underwent ICD implantation.  Repeat cardiac catheterization in April 2020 showed patent LAD stent, 50% distal LAD, 95% distal RCA treated with DES.    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  CTA in September 2021 showed dilated ascending aorta measuring at 4.2 cm, 6 mm left lung nodule.    Recently struggling with progressive volume overload and has had several visits for diuretic titration.   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 Gets SOB and fatigued with any activity. Struggles with ADLs. Takes chair showers but needs assistance. Has been struggling with low BP and fluid overload. Takes torsemide 20 mg in am and 10mg  in pm currently. + LE edema and ab bloating. Struggles with poor sleep and confusion at night. Spends most of his day sitting in a chair playing a puzzle game .    Review of Systems: [y] = yes, [ ]  = no   General: Weight gain [ ] ; Weight loss [ ] ; Anorexia Blue.Reese ]; Fatigue Blue.Reese ]; Fever [ ] ; Chills [ ] ; Weakness [ ]   Cardiac: Chest pain/pressure [ ] ; Resting SOB [ ] ; Exertional SOB Blue.Reese ]; Orthopnea [ ] ; Pedal Edema Blue.Reese ]; Palpitations [ ] ; Syncope [ ] ; Presyncope [ ] ; Paroxysmal nocturnal dyspnea[ ]   Pulmonary: Cough [ ] ; Wheezing[ ] ; Hemoptysis[ ] ; Sputum [ ] ; Snoring [ ]   GI: Vomiting[ ] ; Dysphagia[ ] ; Melena[ ] ; Hematochezia [ ] ; Heartburn[ ] ;  Abdominal pain [ ] ; Constipation [ ] ; Diarrhea [ ] ; BRBPR [ ]   GU: Hematuria[ ] ; Dysuria [ ] ; Nocturia[ ]   Vascular: Pain in legs with walking [ ] ; Pain in feet with lying flat [ ] ; Non-healing sores [ ] ; Stroke [ ] ; TIA [ ] ; Slurred speech [ ] ;  Neuro: Headaches[ ] ; Vertigo[ ] ; Seizures[ ] ; Paresthesias[ ] ;Blurred vision [ ] ; Diplopia [ ] ; Vision changes [ ]   Ortho/Skin: Arthritis Blue.Reese ]; Joint pain Blue.Reese ]; Muscle pain [ ] ; Joint swelling [ ] ; Back Pain [ ] ; Rash [ ]   Psych: Depression[ ] ; Anxiety[y ]  Heme: Bleeding problems [ ] ; Clotting disorders [ ] ; Anemia [ ]   Endocrine: Diabetes [ ] ; Thyroid dysfunction[ ]    Past Medical History:  Diagnosis Date  . Anxiety    Takes Ativan as needed  . Cardiac dysrhythmia, unspecified   . CHF (congestive heart failure) ([place])   . Coronary artery disease    Hx of cardiac caths   . Enlarged prostate   . GERD (gastroesophageal reflux disease)    Takes prilosec  . Gout   . Heart attack ([place])   . HTN (hypertension)   . Hyperkalemia   . Hyperlipemia   . Ischemic cardiomyopathy   . LV dysfunction   . Myocardial infarct ([place]) 1988  . Panic attacks   . Personal history of peptic ulcer disease   .  Renal insufficiency   . Syncope and collapse   . Ventricular tachycardia ([place])     Current Outpatient Medications  Medication Sig Dispense Refill  . aspirin EC 81 MG tablet Take 81 mg by mouth daily. Swallow whole.    . carvedilol (COREG) 12.5 MG tablet Take 1 tablet (12.5 mg total) by mouth 2 (two) times daily with a meal. 180 tablet 3  . LORazepam (ATIVAN) 0.5 MG tablet Take 0.5 mg by mouth in the morning, at noon, and at bedtime.    . melatonin 5 MG TABS Take 5 mg by mouth at bedtime.    . simvastatin (ZOCOR) 40 MG tablet TAKE ONE TABLET BY MOUTH DAILY AT 6 IN THE EVENING 90 tablet 1  . torsemide (DEMADEX) 10 MG tablet Take 1 tablet every afternoon 90 tablet 3  . torsemide (DEMADEX) 20 MG tablet Take 20 mg by mouth in the morning.     No current  facility-administered medications for this encounter.    Allergies  Allergen Reactions  . Entresto [Sacubitril-Valsartan] Other (See Comments)    hyperkalemia  . Niacin And Related Itching and Rash  . Serotonin Reuptake Inhibitors (Ssris) Other (See Comments)  . Sulfamethoxazole-Trimethoprim Other (See Comments)  . Codeine Anxiety      Social History   Socioeconomic History  . Marital status: Married    Spouse name: Not on file  . Number of children: Not on file  . Years of education: Not on file  . Highest education level: Not on file  Occupational History  . Not on file  Tobacco Use  . Smoking status: Never Smoker  . Smokeless tobacco: Never Used  Substance and Sexual Activity  . Alcohol use: Yes    Comment: occassionally  . Drug use: No  . Sexual activity: Not Currently  Other Topics Concern  . Not on file  Social History Narrative  . Not on file   Social Determinants of Health   Financial Resource Strain: Not on file  Food Insecurity: Not on file  Transportation Needs: Not on file  Physical Activity: Not on file  Stress: Not on file  Social Connections: Not on file  Intimate Partner Violence: Not on file      Family History  Problem Relation Age of Onset  . Heart attack Father        x2  . Heart disease Father   . Cancer Father        bladder and prostate  . Anesthesia problems Neg Hx   . Hypotension Neg Hx   . Malignant hyperthermia Neg Hx   . Pseudochol deficiency Neg Hx     Vitals:   12/07/20 1447  BP: (!) 102/58  Pulse: 64  SpO2: 98%  Weight: 69.6 kg (153 lb 6.4 oz)    PHYSICAL EXAM: General: Elderly frail. Sallow. Falls asleep when I am speaking then wakes back up quickly No respiratory difficulty HEENT: normal Neck: supple. JVP to jaw with prominent CV waves. Carotids 2+ bilat; no bruits. No lymphadenopathy or thryomegaly appreciated. Cor: PMI nondisplaced. Regular rate & rhythm. 3/6 MR Lungs: clear Abdomen: soft, nontender, ++  distended. No hepatosplenomegaly. No bruits or masses. Good bowel sounds. Extremities: no cyanosis, clubbing, rash, 3+ edema cool Neuro: alert & oriented x 3, cranial nerves grossly intact. moves all 4 extremities w/o difficulty. Affect pleasant.   ASSESSMENT & PLAN:  1. Acute on chronic systolic heart failure: - due to longstanding iCM - echo 8/21 EF 20 to 25%, mild LAE,  mild RV dysfunction, severe PH, severe MR - he is end-stage with low-output symptoms and marked volume overload. Likely Stage D HF  - NYHA IV - long talk with him and his family about how severe his HF is and that his time may be short but that we would do our best to optimize him and play it a few months at a time. He is not a candidate for mechanical support but we did discuss possibility of home inotropes and switch to Hospice if/when time comes - will stop carvedilol - enroll in [facility] for IV lasix (80 IV bid) with KCL 20 bid. Wrap legs.  - If not diuresing can admit for trial of inotropes - I will see back next week and try to titrate on some afterload reduction +/- [id number]  - avoid digoxin for now with age and CKD - check labs today  2. CAD: - no s/s angina continue ASA/statin   3. Severe MR - this is functional and certainly is contributing to the severity of his HF - symptoms and cardiomyopathy ltoo advanced to benefit from MitraClip  4. CKD stage IIIb:  - creatinine 1.6-1.8 recently - recehck today  5. DNR/DNI - agree with not replacing ICD (at [facility])  Total time spent 55 minutes. Over half that time spent discussing above.    Glori Bickers, MD  3:05 PM

## 2020-12-07 NOTE — Patient Instructions (Signed)
START KCL 24meq (1 tablet) twice daily  STOP Carvedilol  You have been referred to [facility]. They will contact you to schedule an appointment  Labs done today, your results will be available in MyChart, we will contact you for abnormal readings.  Your physician recommends that you schedule a follow-up appointment in: 1-2 weeks  If you have any questions or concerns before your next appointment please send [place] a message through [place] or call our office at [phone].    TO LEAVE A MESSAGE FOR THE NURSE SELECT OPTION 2, PLEASE LEAVE A MESSAGE INCLUDING: . YOUR NAME . DATE OF BIRTH . CALL BACK NUMBER . REASON FOR CALL**this is important as we prioritize the call backs  YOU WILL RECEIVE A CALL BACK THE SAME DAY AS LONG AS YOU CALL BEFORE 4:00 PM

## 2020-12-10 DIAGNOSIS — I5032 Chronic diastolic (congestive) heart failure: Secondary | ICD-10-CM | POA: Diagnosis not present

## 2020-12-11 DIAGNOSIS — I509 Heart failure, unspecified: Secondary | ICD-10-CM | POA: Diagnosis not present

## 2020-12-14 ENCOUNTER — Ambulatory Visit (INDEPENDENT_AMBULATORY_CARE_PROVIDER_SITE_OTHER): Payer: Medicare PPO

## 2020-12-14 DIAGNOSIS — I472 Ventricular tachycardia: Secondary | ICD-10-CM | POA: Diagnosis not present

## 2020-12-14 LAB — CUP PACEART REMOTE DEVICE CHECK
Battery Voltage: 2.63 V
Brady Statistic RV Percent Paced: 0.08 %
Date Time Interrogation Session: 20220217012404
HighPow Impedance: 285 Ohm
HighPow Impedance: 36 Ohm
Implantable Lead Implant Date: 20130111
Implantable Lead Location: 753860
Implantable Lead Model: 6935
Implantable Pulse Generator Implant Date: 20130111
Lead Channel Impedance Value: 342 Ohm
Lead Channel Pacing Threshold Amplitude: 0.375 V
Lead Channel Pacing Threshold Pulse Width: 0.4 ms
Lead Channel Sensing Intrinsic Amplitude: 3.625 mV
Lead Channel Sensing Intrinsic Amplitude: 3.625 mV
Lead Channel Setting Pacing Amplitude: 2.5 V
Lead Channel Setting Pacing Pulse Width: 0.4 ms
Lead Channel Setting Sensing Sensitivity: 0.3 mV

## 2020-12-19 NOTE — Progress Notes (Unsigned)
HPI: FU coronary disease and CHF. He had PCI of his LAD and right coronary artery2003. His ejection fraction was 30%.Has ICD.Abd ultrasound 10/14 showed no aneurysm.Cardiac catheterization April 2020 showed patent LAD stent, 50% distal LAD, 95% distal right coronary artery which was treated with a drug-eluting stent. Carotid Dopplers April 2020 showed no significant obstruction. ABIs July 2021 normal.Last echocardiogram August 2021 showed ejection fraction 20 to 25%, mild left ventricular enlargement, restrictive filling, mild RV dysfunction, mild right ventricular enlargement, severe pulmonary hypertension, biatrial enlargement, severe mitral regurgitation, moderate tricuspid regurgitation,mild to moderate aortic insufficiency and moderately dilated ascending aorta at 46 mm. CTA September 2021 showed dilated ascending aorta measuring 4.2 cm;6 mm left lung nodule and follow-up recommended in 6 to 12 months.  Seen by Dr. Haroldine Laws in CHF clinic February 2022 and felt to be end-stage.  Carvedilol was discontinued and home Lasix was initiated.  Since I last saw him, he denies dyspnea, chest pain, palpitations or syncope.  Minimal pedal edema.  He does complain of fatigue.  Current Outpatient Medications  Medication Sig Dispense Refill  . aspirin EC 81 MG tablet Take 81 mg by mouth daily. Swallow whole.    Marland Kitchen LORazepam (ATIVAN) 0.5 MG tablet Take 0.5 mg by mouth in the morning, at noon, and at bedtime.    . melatonin 5 MG TABS Take 5 mg by mouth at bedtime.    Marland Kitchen QUEtiapine (SEROQUEL) 100 MG tablet Take 100 mg by mouth at bedtime.    . simvastatin (ZOCOR) 40 MG tablet TAKE ONE TABLET BY MOUTH DAILY AT 6 IN THE EVENING 90 tablet 1  . spironolactone (ALDACTONE) 25 MG tablet Take 0.5 tablets (12.5 mg total) by mouth daily. 45 tablet 0  . torsemide (DEMADEX) 20 MG tablet Take 30 mg by mouth daily.     No current facility-administered medications for this visit.     Past Medical History:   Diagnosis Date  . Anxiety    Takes Ativan as needed  . Cardiac dysrhythmia, unspecified   . CHF (congestive heart failure) ([place])   . Coronary artery disease    Hx of cardiac caths   . Enlarged prostate   . GERD (gastroesophageal reflux disease)    Takes prilosec  . Gout   . Heart attack ([place])   . HTN (hypertension)   . Hyperkalemia   . Hyperlipemia   . Ischemic cardiomyopathy   . LV dysfunction   . Myocardial infarct ([place]) 1988  . Panic attacks   . Personal history of peptic ulcer disease   . Renal insufficiency   . Syncope and collapse   . Ventricular tachycardia [facility])     Past Surgical History:  Procedure Laterality Date  . CARDIAC CATHETERIZATION  2003  . CARDIAC DEFIBRILLATOR PLACEMENT    . COLONOSCOPY    . CORONARY ANGIOPLASTY    . CORONARY STENT INTERVENTION N/A 02/22/2019   Procedure: CORONARY STENT INTERVENTION;  Surgeon: Jettie Booze, MD;  Location: [place] [facility];  Service: Cardiovascular;  Laterality: N/A;  . ESOPHAGOGASTRODUODENOSCOPY     with biopsy  . HERNIA REPAIR  01/01/2012  . INSERT / REPLACE / REMOVE PACEMAKER  2005   ICD; replaced in Jan 2013  . LEAD REVISION N/A 11/08/2011   Procedure: LEAD REVISION;  Surgeon: Thompson Grayer, MD;  Location: [facility] CATH LAB;  Service: Cardiovascular;  Laterality: N/A;  . LEFT HEART CATH AND CORONARY ANGIOGRAPHY N/A 02/22/2019   Procedure: LEFT HEART CATH AND CORONARY ANGIOGRAPHY;  Surgeon: Jettie Booze, MD;  Location: [place] [facility];  Service: Cardiovascular;  Laterality: N/A;  . UMBILICAL HERNIA REPAIR    . UMBILICAL HERNIA REPAIR  01/01/2012   Procedure: HERNIA REPAIR UMBILICAL ADULT;  Surgeon: Merrie Roof, MD;  Location: [place];  Service: General;  Laterality: N/A;  umbilical hernia repair with mesh    Social History   Socioeconomic History  . Marital status: Married    Spouse name: Not on file  . Number of children: Not on file  . Years of education: Not on file  . Highest education level:  Not on file  Occupational History  . Not on file  Tobacco Use  . Smoking status: Never Smoker  . Smokeless tobacco: Never Used  Substance and Sexual Activity  . Alcohol use: Yes    Comment: occassionally  . Drug use: No  . Sexual activity: Not Currently  Other Topics Concern  . Not on file  Social History Narrative  . Not on file   Social Determinants of Health   Financial Resource Strain: Not on file  Food Insecurity: Not on file  Transportation Needs: Not on file  Physical Activity: Not on file  Stress: Not on file  Social Connections: Not on file  Intimate Partner Violence: Not on file    Family History  Problem Relation Age of Onset  . Heart attack Father        x2  . Heart disease Father   . Cancer Father        bladder and prostate  . Anesthesia problems Neg Hx   . Hypotension Neg Hx   . Malignant hyperthermia Neg Hx   . Pseudochol deficiency Neg Hx     ROS: Fatigue but no fevers or chills, productive cough, hemoptysis, dysphasia, odynophagia, melena, hematochezia, dysuria, hematuria, rash, seizure activity, orthopnea, PND, claudication. Remaining systems are negative.  Physical Exam: Well-developed well-nourished in no acute distress.  Skin is warm and dry.  HEENT is normal.  Neck is supple.  Chest is clear to auscultation with normal expansion.  Cardiovascular exam is regular rate and rhythm.  Abdominal exam nontender or distended. No masses palpated. Extremities show 1+ ankle edema. neuro grossly intact  A/P  1 chronic systolic congestive heart failure-minimally volume overloaded at present.  Continue diuretics at present dose.  We discussed fluid restriction and low-sodium diet.  He is scheduled for blood work on March 11.  2 ischemic cardiomyopathy-beta-blocker discontinued due to low output.  We will consider addition of ARB in the future if blood pressure allows.  3 coronary artery disease-no chest pain.  Continue aspirin and statin.  4  hyperlipidemia-continue statin.  5 previous ICD-patient is a no CODE BLUE and his generator will not be changed.  6 mitral regurgitation-felt not to be a candidate for MitraClip.  7 chronic stage III kidney disease-blood work scheduled for March 11.  8 lung nodule-patient will need follow-up chest CT September 2022.  Kirk Ruths, MD

## 2020-12-20 NOTE — Progress Notes (Signed)
ADVANCED HF CLINIC NOTE  PCP: Kelton Pillar, MD Primary Cardiologist: Stanford Breed  HPI:  Mr. Adrian Pace an 87 y.o.malewith a hx of CAD, CKD IIIb and systolic HF due to severe iCM referred by Dr. Stanford Breed for further evaluation of his advanced HF.  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 He had PCI of LAD and RCA in 2003. EF was 30% the time. He eventually underwent ICD implantation. Repeat cardiac catheterization in April 2020 showed patent LAD stent, 50% distal LAD, 95% distal RCA treated with DES.   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 CTA in September 2021 showed dilated ascending aorta measuring at 4.2 cm, 6 mm left lung nodule.   We saw him for the first time on 12/07/20 with his 2 daughters and wife. Appeared end-stage. Was volume overloaded. Enrolled with [facility]. Diuresed well but noted to be very limited physically and had evidence of significant dementia according to care team.  Here for f/u with his 2 daughters. Lost about 15 pounds with [facility]. Says he can't tell a difference in how he feels. No SOB or dizziness. Taking 1mg  ativan at [place], 6p and again in the middle of the night. Also taking seroquel 100 mg at night. Says he naps all day and awake all night. Very confused and agitated at night. Now on torsemide 30 daily. Weight stable at 136.   ROS: All systems negative except as listed in HPI, PMH and Problem List.  SH:  Social History   Socioeconomic History   Marital status: Married    Spouse name: Not on file   Number of children: Not on file   Years of education: Not on file   Highest education level: Not on file  Occupational History   Not on file  Tobacco Use   Smoking status: Never Smoker   Smokeless tobacco:  Never Used  Substance and Sexual Activity   Alcohol use: Yes    Comment: occassionally   Drug use: No   Sexual activity: Not Currently  Other Topics Concern   Not on file  Social History Narrative   Not on file   Social Determinants of Health   Financial Resource Strain: Not on file  Food Insecurity: Not on file  Transportation Needs: Not on file  Physical Activity: Not on file  Stress: Not on file  Social Connections: Not on file  Intimate Partner Violence: Not on file    FH:  Family History  Problem Relation Age of Onset   Heart attack Father        x2   Heart disease Father    Cancer Father        bladder and prostate   Anesthesia problems Neg Hx    Hypotension Neg Hx    Malignant hyperthermia Neg Hx    Pseudochol deficiency Neg Hx     Past Medical History:  Diagnosis Date   Anxiety    Takes Ativan as needed   Cardiac dysrhythmia, unspecified    CHF (congestive heart failure) ([facility])    Coronary artery disease    Hx of cardiac caths    Enlarged prostate    GERD (gastroesophageal reflux disease)    Takes prilosec   Gout    Heart attack ([facility] [facility])    HTN (  hypertension)    Hyperkalemia    Hyperlipemia    Ischemic cardiomyopathy    LV dysfunction    Myocardial infarct (HCC) 1988   Panic attacks    Personal history of peptic ulcer disease    Renal insufficiency    Syncope and collapse    Ventricular tachycardia (HCC)     Current Outpatient Medications  Medication Sig Dispense Refill   aspirin EC 81 MG tablet Take 81 mg by mouth daily. Swallow whole.     LORazepam (ATIVAN) 0.5 MG tablet Take 0.5 mg by mouth in the morning, at noon, and at bedtime.     melatonin 5 MG TABS Take 5 mg by mouth at bedtime.     potassium chloride SA (KLOR-CON) 20 MEQ tablet Take 1 tablet (20 mEq total) by mouth 2 (two) times daily. 180 tablet 3   QUEtiapine (SEROQUEL) 100 MG tablet Take 100 mg by mouth at bedtime.     simvastatin (ZOCOR) 40 MG  tablet TAKE ONE TABLET BY MOUTH DAILY AT 6 IN THE EVENING 90 tablet 1   torsemide (DEMADEX) 20 MG tablet Take 30 mg by mouth daily.     No current facility-administered medications for this encounter.    Vitals:   12/21/20 1355  BP: 126/64  Pulse: 78  SpO2: 99%  Weight: 63.9 kg (140 lb 12.8 oz)   Wt Readings from Last 3 Encounters:  12/21/20 63.9 kg (140 lb 12.8 oz)  12/07/20 69.6 kg (153 lb 6.4 oz)  11/02/20 67.9 kg (149 lb 9.6 oz)   PHYSICAL EXAM:  General:  Elderly weak appearing. No resp difficulty HEENT: normal Neck: supple. JVP 8-9. Carotids 2+ bilaterally; no bruits. No lymphadenopathy or thryomegaly appreciated. Cor: PMI normal. Regular rate & rhythm. 2/6 TR/MR  Lungs: clear Abdomen: soft, nontender, mildly distended. No hepatosplenomegaly. No bruits or masses. Good bowel sounds. Extremities: no cyanosis, clubbing, rash, edema Neuro: alert & orientedx3, cranial nerves grossly intact. Moves all 4 extremities w/o difficulty. Affect pleasant.   ASSESSMENT & PLAN:  1. Acute on chronic systolic heart failure: - due to longstanding iCM - echo 8/21 EF 20 to 25%, mild LAE, mild RV dysfunction, severe PH, severe MR - he is end-stage  - volume status and symptoms improved with stopping b-blocker and diuresis   - NYHA IIIB  - Volume status improved but still with some volume on board - Continue torsemide 30 daily. - Add spiro 12.5. Stop Kcl. Labs today and in 1 week - Can consider SGLT2i if he continues to stabilize - avoid digoxin for now with age and CKD - long talk today with him and his daughters about need for Palliative Care support to help with sundowning and anxiety and also to support his wife. He agrees.  - encouraged family to let him sleep in the am and then keep him awake and active from 12p- early evening and then let him go to bed to try and get sleep cycle back in rhythm   2. CAD: - no s/s angina. continue ASA/statin   3. Severe MR - this is  functional and certainly is contributing to the severity of his HF - symptoms and cardiomyopathy too advanced to benefit from MitraClip  4. CKD stage IIIb:  - creatinine 1.5-1.8 recently - recheck labs today  5. Dementia/sundowning - managed by PCP and [facility] - see suggestions above - Palliative Care consult places  6. DNR/DNI - agree with not replacing ICD (at [facility])  Total time spent 45  minutes. Over half that time spent discussing above.    Glori Bickers, MD  2:24 PM

## 2020-12-21 ENCOUNTER — Encounter (HOSPITAL_COMMUNITY): Payer: Self-pay | Admitting: Internal Medicine

## 2020-12-21 ENCOUNTER — Ambulatory Visit (HOSPITAL_COMMUNITY)
Admission: RE | Admit: 2020-12-21 | Discharge: 2020-12-21 | Disposition: A | Discharge status: Home / Self Care | Payer: Medicare PPO | Source: Ambulatory Visit | Attending: Internal Medicine | Admitting: Internal Medicine

## 2020-12-21 ENCOUNTER — Other Ambulatory Visit: Payer: Self-pay

## 2020-12-21 VITALS — BP 126/64 | HR 78 | Wt 140.8 lb

## 2020-12-21 DIAGNOSIS — Z7982 Long term (current) use of aspirin: Secondary | ICD-10-CM | POA: Diagnosis not present

## 2020-12-21 DIAGNOSIS — F419 Anxiety disorder, unspecified: Secondary | ICD-10-CM | POA: Diagnosis not present

## 2020-12-21 DIAGNOSIS — I340 Nonrheumatic mitral (valve) insufficiency: Secondary | ICD-10-CM

## 2020-12-21 DIAGNOSIS — I255 Ischemic cardiomyopathy: Secondary | ICD-10-CM | POA: Insufficient documentation

## 2020-12-21 DIAGNOSIS — I5023 Acute on chronic systolic (congestive) heart failure: Secondary | ICD-10-CM | POA: Diagnosis not present

## 2020-12-21 DIAGNOSIS — I2510 Atherosclerotic heart disease of native coronary artery without angina pectoris: Secondary | ICD-10-CM | POA: Diagnosis not present

## 2020-12-21 DIAGNOSIS — F0390 Unspecified dementia without behavioral disturbance: Secondary | ICD-10-CM | POA: Insufficient documentation

## 2020-12-21 DIAGNOSIS — Z955 Presence of coronary angioplasty implant and graft: Secondary | ICD-10-CM | POA: Diagnosis not present

## 2020-12-21 DIAGNOSIS — I5022 Chronic systolic (congestive) heart failure: Secondary | ICD-10-CM | POA: Diagnosis not present

## 2020-12-21 DIAGNOSIS — I252 Old myocardial infarction: Secondary | ICD-10-CM | POA: Diagnosis not present

## 2020-12-21 DIAGNOSIS — Z8249 Family history of ischemic heart disease and other diseases of the circulatory system: Secondary | ICD-10-CM | POA: Diagnosis not present

## 2020-12-21 DIAGNOSIS — N1832 Chronic kidney disease, stage 3b: Secondary | ICD-10-CM | POA: Insufficient documentation

## 2020-12-21 DIAGNOSIS — N183 Chronic kidney disease, stage 3 unspecified: Secondary | ICD-10-CM

## 2020-12-21 DIAGNOSIS — Z79899 Other long term (current) drug therapy: Secondary | ICD-10-CM | POA: Insufficient documentation

## 2020-12-21 DIAGNOSIS — I130 Hypertensive heart and chronic kidney disease with heart failure and stage 1 through stage 4 chronic kidney disease, or unspecified chronic kidney disease: Secondary | ICD-10-CM | POA: Diagnosis not present

## 2020-12-21 DIAGNOSIS — Z66 Do not resuscitate: Secondary | ICD-10-CM | POA: Insufficient documentation

## 2020-12-21 LAB — BASIC METABOLIC PANEL
Anion gap: 8 (ref 5–15)
BUN: 26 mg/dL — ABNORMAL HIGH (ref 8–23)
CO2: 23 mmol/L (ref 22–32)
Calcium: 8.9 mg/dL (ref 8.9–10.3)
Chloride: 107 mmol/L (ref 98–111)
Creatinine, Ser: 1.67 mg/dL — ABNORMAL HIGH (ref 0.61–1.24)
GFR, Estimated: 40 mL/min — ABNORMAL LOW (ref >60)
Glucose, Bld: 139 mg/dL — ABNORMAL HIGH (ref 70–99)
Potassium: 5.1 mmol/L (ref 3.5–5.1)
Sodium: 138 mmol/L (ref 135–145)

## 2020-12-21 LAB — BRAIN NATRIURETIC PEPTIDE: B Natriuretic Peptide: 2597.7 pg/mL — ABNORMAL HIGH (ref 0.0–100.0)

## 2020-12-21 MED ORDER — SPIRONOLACTONE 25 MG PO TABS: 12.5000 mg | ORAL_TABLET | Freq: Every day | ORAL | 0 refills | Status: DC

## 2020-12-21 NOTE — Patient Instructions (Signed)
START Spironolactone 12.5mg  (1/2 tablet) daily  STOP KCL  Labs done today, your results will be available in MyChart, we will contact you for abnormal readings.  Your physician recommends that you have repeat labs drawn in 1-2 weeks. A prescription has been sent with you to have them drawn locally  You have been referred to Palliative care. They will contact you to schedule a follow up appointment  Your physician recommends that you schedule a follow-up appointment in: 1 month  If you have any questions or concerns before your next appointment please send [place] a message through [place] or call our office at [phone].    TO LEAVE A MESSAGE FOR THE NURSE SELECT OPTION 2, PLEASE LEAVE A MESSAGE INCLUDING: . YOUR NAME . DATE OF BIRTH . CALL BACK NUMBER . REASON FOR CALL**this is important as we prioritize the call backs  YOU WILL RECEIVE A CALL BACK THE SAME DAY AS LONG AS YOU CALL BEFORE 4:00 PM

## 2020-12-21 NOTE — Progress Notes (Signed)
Remote ICD transmission.   

## 2020-12-22 ENCOUNTER — Encounter: Payer: Self-pay | Admitting: Cardiology

## 2020-12-22 ENCOUNTER — Ambulatory Visit (INDEPENDENT_AMBULATORY_CARE_PROVIDER_SITE_OTHER): Payer: Medicare PPO | Admitting: Cardiology

## 2020-12-22 ENCOUNTER — Encounter (HOSPITAL_COMMUNITY): Payer: Self-pay

## 2020-12-22 VITALS — BP 120/60 | HR 97 | Ht 65.0 in | Wt 140.0 lb

## 2020-12-22 DIAGNOSIS — I2510 Atherosclerotic heart disease of native coronary artery without angina pectoris: Secondary | ICD-10-CM

## 2020-12-22 DIAGNOSIS — I255 Ischemic cardiomyopathy: Secondary | ICD-10-CM

## 2020-12-22 DIAGNOSIS — I5022 Chronic systolic (congestive) heart failure: Secondary | ICD-10-CM

## 2020-12-22 DIAGNOSIS — I340 Nonrheumatic mitral (valve) insufficiency: Secondary | ICD-10-CM

## 2020-12-22 DIAGNOSIS — I10 Essential (primary) hypertension: Secondary | ICD-10-CM

## 2020-12-22 NOTE — Progress Notes (Signed)
Referral for Palliative care faxed to [facility] at [phone]. Referral form sent to be scanned into chart

## 2020-12-22 NOTE — Patient Instructions (Signed)
  Follow-Up: At [facility], you and your health needs are our priority.  As part of our continuing mission to provide you with exceptional heart care, we have created designated Provider Care Teams.  These Care Teams include your primary Cardiologist (physician) and Advanced Practice Providers (APPs -  Physician Assistants and Nurse Practitioners) who all work together to provide you with the care you need, when you need it.  We recommend signing up for the patient portal called "MyChart".  Sign up information is provided on this After Visit Summary.  MyChart is used to connect with patients for Virtual Visits (Telemedicine).  Patients are able to view lab/test results, encounter notes, upcoming appointments, etc.  Non-urgent messages can be sent to your provider as well.   To learn more about what you can do with MyChart, go to [email or web address].    Your next appointment:   4 month(s)  The format for your next appointment:   In Person  Provider:   Kirk Ruths, MD

## 2020-12-25 ENCOUNTER — Telehealth: Payer: Self-pay

## 2020-12-25 NOTE — Telephone Encounter (Signed)
Spoke with patient's daughter Caryl Pina and scheduled an in-person Palliative Consult for 01/01/21 @ 1PM  COVID screening was negative. Two cats in the home. Patient lives with his wife downstairs of daughter Ashley's home.  Consent obtained; updated Outlook/Netsmart/Team List and Epic.  Family is aware they may be receiving a call from NP the day before or day of to confirm appointment.

## 2021-01-01 ENCOUNTER — Other Ambulatory Visit: Payer: Self-pay | Admitting: Student

## 2021-01-01 ENCOUNTER — Other Ambulatory Visit: Payer: Self-pay

## 2021-01-01 DIAGNOSIS — Z515 Encounter for palliative care: Secondary | ICD-10-CM | POA: Diagnosis not present

## 2021-01-01 NOTE — Progress Notes (Signed)
[facility] Consult Note Telephone: [phone]  Fax: [phone]  PATIENT NAME: Adrian Pace [address] [place] [address] [phone] (home)  DOB: [birth year] MRN: [id number]  PRIMARY CARE PROVIDER:    Kelton Pillar, MD,  [place] [employer] [place] [address] [phone]  REFERRING PROVIDER:   Dr. Haroldine Laws   RESPONSIBLE PARTY:   Extended Emergency Contact Information Primary Emergency Contact: Clayton Bibles Mobile Phone: [phone] Relation: Daughter Preferred language: English Interpreter needed? No Secondary Emergency Contact: Hilligoss,Betty F Address: [address]          [place], [place] [address] [place] Phone: [phone] Relation: Spouse  I met face to face with patient and family in patient's home.  ASSESSMENT AND RECOMMENDATIONS:   Advance Care Planning: Visit at the request of Dr. Haroldine Laws for palliative consult. Visit consisted of building trust and discussions on Palliative care medicine as specialized medical care for people living with serious illness, aimed at facilitating improved quality of life through symptoms relief, assisting with advance care planning and establishing goals of care. Education provided on Palliative Medicine vs. Hospice services. Ongoing education on disease processes. Palliative Medicine to monitor for changes and declines; will refer patient to Hospice services when he meets criteria. Palliative care will continue to provide support to patient, family and the medical team.  Goal of care: To maintain current level of care, have symptoms managed.   Directives: MOST form; indicates DNR, comfort interventions, antibiotics as indicated, address IV fluids as needed, no feeding tube.  Symptom Management:   Heart failure, edema- patient with chronic systolic heart failure. Presently denies symptoms such as shortness of breath. Does have  pedal edema; recommend daily weights, continue torsemide 41m daily as directed, wear compression hose/socks daily on each am, off QHS. Will monitor for worsening symptoms such as chest pain, cough, fatigue, confusion, decrease in appetite, shortness of breath, edema. Follow up with Cardiology as scheduled.  Anxiety/insomnia-patient with hx of anxiety, worsens at night, leading to insomnia. Recommend administering lorazepam 0.513mat 6pm and 10pm, increase melatonin to 1023mHS, continue quetiapine 200m27mS.   Follow up Palliative Care Visit: Palliative care will continue to follow for complex decision making and symptom management. Return in 4 weeks or prn.  Family /Caregiver/Community Supports: Patient currently receiving nursing and PT services from [facility]. Will make referral to Palliative SW for long range planning.      I spent 90 minutes providing this consultation, from 1:10pm to 2:40pm. Time includes time spent with patient/family, chart review, provider coordination, and documentation. More than 50% of the time in this consultation was spent counseling and coordinating communication.   CHIEF COMPLAINT: Palliative Medicine initial consult.   History obtained from review of EMR and interview with family Records reviewed and summarized below.  HISTORY OF PRESENT ILLNESS:  WillMARQUS Pace 88 y75. year old male with multiple medical problems including chronic systolic heart failure NYHA 3B, CAD, CKDIIIb, anxiety, enlarged prostate, gout, hypertension, hyperlipidemia, GERD; hx of MI, ventricular tachycardia, panic attacks. Palliative Care was asked to follow this patient by consultation request of Dr. BensHaroldine Lawshelp address advance care planning and goals of care. This is an initial visit.  Adrian Pace resides at home with his wife, daughter AshlCaryl Pina husband. Daughter HaleHildred Alamino involved in care. Patient reports feeling okay. He denies pain, chest pain, shortness of  breath at rest and with exertion. Does  sleep with HOB elevated; although denies orthopnea, PND. He reports improvement in edema and abdominal "fullness" since receiving IV lasix. He is back to taking torsemide 63m daily. Endorses fatigue. He states he is able to complete adl's. He is receiving nursing and PT services through [facility]. He weighs daily; his weight is down to 126 pounds, 10 pound loss since 12/21/20. Family reports "sundowning" which starts around 6pm. He endorses anxiety, which worsens at night, causing him difficulty in sleeping. His quetiapine was increased around 1 week ago by [facility] NP. Patient endorses a good appetite. Family does express need for assistance with long range planning, when daughter ACaryl Pinareturns to working outside of the home.    CODE STATUS: DNR  PPS: 50%  HOSPICE ELIGIBILITY/DIAGNOSIS: TBD   ROS   General: NAD EYES: denies vision changes ENMT: no swallowing difficulty Cardiovascular: denies chest pain, palpitations Pulmonary: denies cough, denies increased SOB Abdomen: endorses good appetite, denies constipation GU: denies dysuria MSK: ambulatory, no falls reported Skin: denies rashes or wounds Neurological: endorses weakness, insomnia Psych: reports anxiety Heme/lymph/immuno: bruises easily    Physical Exam: Weight: 126 pounds Constitutional: NAD General: Alert and oriented x 3, frail appearing, thin EYES: anicteric sclera, lids intact, no discharge  ENMT: intact hearing,oral mucous membranes moist CV: RRR, trace pedal edema Pulmonary: LCTA,  no increased work of breathing, no cough, room air Abdomen: non-distended, non tender to palpation, bowel sounds normoactive x 4 GU: deferred MSK: sarcopenia, able to move all extremities, ambulatory Skin: warm and dry Neuro: Generalized weakness Psych: non-anxious affect today Hem/lymph/immuno: hemorrhagic areas to extremities   PAST MEDICAL HISTORY:  Past Medical History:  Diagnosis  Date  . Anxiety    Takes Ativan as needed  . Cardiac dysrhythmia, unspecified   . CH[place] ([place]estive heart failure) ([facility]   . Coronary artery disease    Hx of cardiac caths   . Enlarged prostate   . GERD (gastroesophageal reflux disease)    Takes prilosec  . Gout   . Heart attack ([facility]   . HTN (hypertension)   . Hyperkalemia   . Hyperlipemia   . Ischemic cardiomyopathy   . LV dysfunction   . [place]ocardial infarct ([facility] 1988  . Panic attacks   . Personal history of peptic ulcer disease   . Renal insufficiency   . Syncope and collapse   . [place] tachycardia ([facility]     SOCIAL HX:  Social History   Tobacco Use  . Smoking status: Never Smoker  . Smokeless tobacco: Never Used  Substance Use Topics  . Alcohol use: Yes    Comment: occassionally   FAMILY HX:  Family History  Problem Relation Age of Onset  . Heart attack Father        x2  . Heart disease Father   . Cancer Father        bladder and prostate  . Anesthesia problems Neg Hx   . Hypotension Neg Hx   . Malignant hyperthermia Neg Hx   . Pseudochol deficiency Neg Hx     ALLERGIES:  Allergies  Allergen Reactions  . Entresto [Sacubitril-Valsartan] Other (See Comments)    hyperkalemia  . Niacin And Related Itching and Rash  . Serotonin Reuptake Inhibitors (Ssris) Other (See Comments)  . Sulfamethoxazole-Trimethoprim Other (See Comments)  . Codeine Anxiety     PERTINENT MEDICATIONS:  Outpatient Encounter Medications as of 01/01/2021  Medication Sig  . aspirin EC 81 MG tablet Take 81 mg by mouth daily. Swallow whole.  .Marland Kitchen  LORazepam (ATIVAN) 0.5 MG tablet Take 0.5 mg by mouth in the morning, at noon, and at bedtime.  . melatonin 5 MG TABS Take 5 mg by mouth at bedtime.  Marland Kitchen QUEtiapine (SEROQUEL) 100 MG tablet Take 100 mg by mouth at bedtime.  . simvastatin (ZOCOR) 40 MG tablet TAKE ONE TABLET BY MOUTH DAILY AT 6 IN THE EVENING  . spironolactone (ALDACTONE) 25 MG tablet Take 0.5 tablets (12.5 mg total) by mouth daily.   Marland Kitchen torsemide (DEMADEX) 20 MG tablet Take 30 mg by mouth daily.   No facility-administered encounter medications on file as of 01/01/2021.     Thank you for the opportunity to participate in the care of Mr. Lorey. The palliative care team will continue to follow. Please call our office at [phone] if we can be of additional assistance.  Charlann Boxer, AGPCNP-C

## 2021-01-02 DIAGNOSIS — I509 Heart failure, unspecified: Secondary | ICD-10-CM | POA: Diagnosis not present

## 2021-01-02 DIAGNOSIS — R634 Abnormal weight loss: Secondary | ICD-10-CM | POA: Diagnosis not present

## 2021-01-03 ENCOUNTER — Other Ambulatory Visit (HOSPITAL_COMMUNITY): Payer: Self-pay | Admitting: Internal Medicine

## 2021-01-05 ENCOUNTER — Telehealth: Payer: Self-pay | Admitting: Student

## 2021-01-05 NOTE — Telephone Encounter (Signed)
Palliative NP spoke with daughter Caryl Pina. She states although melatonin was just increased, patient is still waking up during the middle of the night and is asking for his lorazepam. Patient has lorazepam ordered TID and this would still be within his parameters to take if he should need it. Patient's seroquel was also recently increased by [facility]. She is encouraged to call as needed.

## 2021-01-11 ENCOUNTER — Telehealth: Payer: Self-pay

## 2021-01-11 NOTE — Telephone Encounter (Signed)
01/11/21 @430PM : Palliative care SW outreached patients daughter, Caryl Pina, to discuss long term planning for patient   Daughter shared that she has been speaking with [facility] team and was told that hospice may be the next est step for patient at this time due to patients current medication regimen and adjustments not being effective and [facility] no having much more guidance or assistance to offer in this regard. Daughter shared that Asencion Partridge, RN with [facility], is inquiring about a hospice referral from patients provider. SW will make palliative care NP aware.

## 2021-01-15 ENCOUNTER — Ambulatory Visit (INDEPENDENT_AMBULATORY_CARE_PROVIDER_SITE_OTHER): Payer: Medicare PPO

## 2021-01-15 ENCOUNTER — Telehealth: Payer: Self-pay | Admitting: Cardiology

## 2021-01-15 DIAGNOSIS — I255 Ischemic cardiomyopathy: Secondary | ICD-10-CM

## 2021-01-15 NOTE — Telephone Encounter (Signed)
Spoke to daughter (ok per DPR)-she states she went to get the simvastatin refilled and pharmacy stated there was a note saying he will need a new prescribing physician.  Advised unsure where this note was populating from but Dr. Stanford Breed will continue to fill at this time.   Daughter verbalized understanding.

## 2021-01-15 NOTE — Telephone Encounter (Signed)
Pt c/o medication issue:  1. Name of Medication: simvastatin (ZOCOR) 40 MG tablet  2. How are you currently taking this medication (dosage and times per day)? As directed  3. Are you having a reaction (difficulty breathing--STAT)? no  4. What is your medication issue? Patient wants to know if Dr. Stanford Breed still wants him to still be taking this medication. She states that the pharmacist told her that there is a note on this medication that states they will need a new ordering physician. Please advise.

## 2021-01-16 LAB — CUP PACEART REMOTE DEVICE CHECK
Battery Voltage: 2.62 V
Brady Statistic RV Percent Paced: 0.01 %
Date Time Interrogation Session: 20220321033524
HighPow Impedance: 285 Ohm
HighPow Impedance: 52 Ohm
Implantable Lead Implant Date: 20130111
Implantable Lead Location: 753860
Implantable Lead Model: 6935
Implantable Pulse Generator Implant Date: 20130111
Lead Channel Impedance Value: 361 Ohm
Lead Channel Pacing Threshold Amplitude: 0.5 V
Lead Channel Pacing Threshold Pulse Width: 0.4 ms
Lead Channel Sensing Intrinsic Amplitude: 4.5 mV
Lead Channel Sensing Intrinsic Amplitude: 4.5 mV
Lead Channel Setting Pacing Amplitude: 2.5 V
Lead Channel Setting Pacing Pulse Width: 0.4 ms
Lead Channel Setting Sensing Sensitivity: 0.3 mV

## 2021-01-19 ENCOUNTER — Telehealth (HOSPITAL_COMMUNITY): Payer: Self-pay | Admitting: *Deleted

## 2021-01-19 ENCOUNTER — Ambulatory Visit: Payer: Medicare PPO | Admitting: Cardiology

## 2021-01-19 NOTE — Telephone Encounter (Signed)
Marzetta Board with [facility] left VM asking if patient is hospice eligible at this time. Pt has an office visit with Dr.Bensimhon Monday and if Dr.Bensimhon agrees pt is hospice eligible she asks that we address ICD status and discontinuing it/turning it off. She asks for a call back after visit.  Call back # [phone]

## 2021-01-22 ENCOUNTER — Other Ambulatory Visit: Payer: Self-pay

## 2021-01-22 ENCOUNTER — Encounter (HOSPITAL_COMMUNITY): Payer: Self-pay | Admitting: Internal Medicine

## 2021-01-22 ENCOUNTER — Ambulatory Visit (HOSPITAL_COMMUNITY)
Admission: RE | Admit: 2021-01-22 | Discharge: 2021-01-22 | Disposition: A | Discharge status: Home / Self Care | Payer: Medicare PPO | Source: Ambulatory Visit | Attending: Internal Medicine | Admitting: Internal Medicine

## 2021-01-22 VITALS — BP 90/50 | HR 76 | Wt 123.8 lb

## 2021-01-22 DIAGNOSIS — Z79899 Other long term (current) drug therapy: Secondary | ICD-10-CM | POA: Diagnosis not present

## 2021-01-22 DIAGNOSIS — Z7982 Long term (current) use of aspirin: Secondary | ICD-10-CM | POA: Insufficient documentation

## 2021-01-22 DIAGNOSIS — Z8711 Personal history of peptic ulcer disease: Secondary | ICD-10-CM | POA: Diagnosis not present

## 2021-01-22 DIAGNOSIS — Z4502 Encounter for adjustment and management of automatic implantable cardiac defibrillator: Secondary | ICD-10-CM | POA: Insufficient documentation

## 2021-01-22 DIAGNOSIS — Z66 Do not resuscitate: Secondary | ICD-10-CM | POA: Diagnosis not present

## 2021-01-22 DIAGNOSIS — I340 Nonrheumatic mitral (valve) insufficiency: Secondary | ICD-10-CM

## 2021-01-22 DIAGNOSIS — N1832 Chronic kidney disease, stage 3b: Secondary | ICD-10-CM | POA: Diagnosis not present

## 2021-01-22 DIAGNOSIS — I255 Ischemic cardiomyopathy: Secondary | ICD-10-CM | POA: Insufficient documentation

## 2021-01-22 DIAGNOSIS — I130 Hypertensive heart and chronic kidney disease with heart failure and stage 1 through stage 4 chronic kidney disease, or unspecified chronic kidney disease: Secondary | ICD-10-CM | POA: Diagnosis not present

## 2021-01-22 DIAGNOSIS — I5084 End stage heart failure: Secondary | ICD-10-CM | POA: Insufficient documentation

## 2021-01-22 DIAGNOSIS — I252 Old myocardial infarction: Secondary | ICD-10-CM | POA: Insufficient documentation

## 2021-01-22 DIAGNOSIS — I083 Combined rheumatic disorders of mitral, aortic and tricuspid valves: Secondary | ICD-10-CM | POA: Insufficient documentation

## 2021-01-22 DIAGNOSIS — I2510 Atherosclerotic heart disease of native coronary artery without angina pectoris: Secondary | ICD-10-CM | POA: Insufficient documentation

## 2021-01-22 DIAGNOSIS — Z8249 Family history of ischemic heart disease and other diseases of the circulatory system: Secondary | ICD-10-CM | POA: Insufficient documentation

## 2021-01-22 DIAGNOSIS — Z955 Presence of coronary angioplasty implant and graft: Secondary | ICD-10-CM | POA: Insufficient documentation

## 2021-01-22 DIAGNOSIS — I272 Pulmonary hypertension, unspecified: Secondary | ICD-10-CM | POA: Diagnosis not present

## 2021-01-22 DIAGNOSIS — I5022 Chronic systolic (congestive) heart failure: Secondary | ICD-10-CM

## 2021-01-22 DIAGNOSIS — F0390 Unspecified dementia without behavioral disturbance: Secondary | ICD-10-CM | POA: Diagnosis not present

## 2021-01-22 NOTE — Addendum Note (Signed)
Addended by: Cheri Kearns A on: 01/22/2021 09:10 AM   Modules accepted: Level of Service

## 2021-01-22 NOTE — Progress Notes (Signed)
Remote ICD transmission.   

## 2021-01-22 NOTE — Progress Notes (Signed)
ADVANCED HF CLINIC NOTE  PCP: Kelton Pillar, MD Primary Cardiologist: Stanford Breed  HPI:  Adrian Pace an 87 y.o.malewith a hx of CAD, CKD IIIb and systolic HF due to severe iCM referred by Dr. Stanford Breed for further evaluation of his advanced HF.  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 He had PCI of LAD and RCA in 2003. EF was 30% the time. He eventually underwent ICD implantation. Repeat cardiac catheterization in April 2020 showed patent LAD stent, 50% distal LAD, 95% distal RCA treated with DES.   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 CTA in September 2021 showed dilated ascending aorta measuring at 4.2 cm, 6 mm left lung nodule.   We saw him for the first time on 12/07/20 with his 2 daughters and wife. Appeared end-stage. Was volume overloaded. Enrolled with [facility] HF-AT-Home. Diuresed well but noted to be very limited physically and had evidence of significant dementia according to care team.  Here for f/u with his 2 daughters and his wife. Since we last saw him was seen by Palliative Care and sleeping meds adjusted but still was having problems. About 1 week ago was enrolled in Hospice. Felt better for a while with less agitation at night but last couple of nights was at the beach and was a bit more agitated.   ROS: All systems negative except as listed in HPI, PMH and Problem List.  SH:  Social History   Socioeconomic History  . Marital status: Married    Spouse name: Not on file  . Number of children: Not on file  . Years of education: Not on file  . Highest education level: Not on file  Occupational History  . Not on file  Tobacco Use  . Smoking status: Never Smoker  . Smokeless tobacco: Never Used  Substance and Sexual Activity  . Alcohol  use: Yes    Comment: occassionally  . Drug use: No  . Sexual activity: Not Currently  Other Topics Concern  . Not on file  Social History Narrative  . Not on file   Social Determinants of Health   Financial Resource Strain: Not on file  Food Insecurity: Not on file  Transportation Needs: Not on file  Physical Activity: Not on file  Stress: Not on file  Social Connections: Not on file  Intimate Partner Violence: Not on file    FH:  Family History  Problem Relation Age of Onset  . Heart attack Father        x2  . Heart disease Father   . Cancer Father        bladder and prostate  . Anesthesia problems Neg Hx   . Hypotension Neg Hx   . Malignant hyperthermia Neg Hx   . Pseudochol deficiency Neg Hx     Past Medical History:  Diagnosis Date  . Anxiety    Takes Ativan as needed  . Cardiac dysrhythmia, unspecified   . CHF (congestive heart failure) ([place])   . Coronary artery disease    Hx of cardiac caths   . Enlarged prostate   . GERD (gastroesophageal reflux disease)    Takes prilosec  . Gout   . Heart attack ([place])   . HTN (hypertension)   . Hyperkalemia   . Hyperlipemia   .  Ischemic cardiomyopathy   . LV dysfunction   . Myocardial infarct ([place]) 1988  . Panic attacks   . Personal history of peptic ulcer disease   . Renal insufficiency   . Syncope and collapse   . Ventricular tachycardia ([place])     Current Outpatient Medications  Medication Sig Dispense Refill  . aspirin EC 81 MG tablet Take 81 mg by mouth daily. Swallow whole.    Marland Kitchen LORazepam (ATIVAN) 0.5 MG tablet Take 0.5 mg by mouth daily in the afternoon.    Marland Kitchen LORazepam (ATIVAN) 0.5 MG tablet Take 0.5 mg by mouth at bedtime as needed for anxiety.    . Melatonin 5 MG CAPS Take 10 mg by mouth at bedtime.    Marland Kitchen QUEtiapine (SEROQUEL) 100 MG tablet Take 100 mg by mouth at bedtime.    Marland Kitchen spironolactone (ALDACTONE) 25 MG tablet Take 25 mg by mouth daily.    Marland Kitchen torsemide (DEMADEX) 20 MG tablet Take 30 mg by mouth  daily.    . traZODone (DESYREL) 100 MG tablet Take 100 mg by mouth at bedtime.     No current facility-administered medications for this encounter.    Vitals:   01/22/21 1517  BP: (!) 90/50  Pulse: 76  SpO2: 100%  Weight: 56.2 kg (123 lb 12.8 oz)   Wt Readings from Last 3 Encounters:  01/22/21 56.2 kg (123 lb 12.8 oz)  12/22/20 63.5 kg (140 lb)  12/21/20 63.9 kg (140 lb 12.8 oz)   PHYSICAL EXAM:  General:  Elderly weak appearing. No resp difficulty HEENT: normal Neck: supple. no JVD. Carotids 2+ bilat; no bruits. No lymphadenopathy or thryomegaly appreciated. Cor: PMI nondisplaced. Regular rate & rhythm. 2/6 MR/TR Lungs: clear Abdomen: soft, nontender, nondistended. No hepatosplenomegaly. No bruits or masses. Good bowel sounds. Extremities: no cyanosis, clubbing, rash, edema Neuro: alert & orientedx3, cranial nerves grossly intact. moves all 4 extremities w/o difficulty. Affect pleasant   ASSESSMENT & PLAN:  1. Chronic systolic heart failure: - due to longstanding iCM - echo 8/21 EF 20 to 25%, mild LAE, mild RV dysfunction, severe PH, severe MR - he is end-stage now on hospice care - Volume status much imporved - Continue torsemide 30 daily and spiro 12.5 - Discussed EOL care today. Will deactivate ICD  2. CAD: - no s/s angina  3. Severe MR - this is functional and certainly is contributing to the severity of his HF - symptoms and cardiomyopathy too advanced to benefit from MitraClip  4. CKD stage IIIb:  - creatinine 1.5-1.8 recently  5. Dementia/sundowning - managed by PCP and [facility]  6. DNR/DNI - now on Hospice care - ICD deactivated in clinic  Adrian Bickers, MD  4:11 PM

## 2021-01-26 ENCOUNTER — Other Ambulatory Visit: Payer: Self-pay

## 2021-01-26 ENCOUNTER — Telehealth: Payer: Self-pay

## 2021-01-26 ENCOUNTER — Telehealth (HOSPITAL_COMMUNITY): Payer: Self-pay | Admitting: Internal Medicine

## 2021-01-26 ENCOUNTER — Ambulatory Visit (INDEPENDENT_AMBULATORY_CARE_PROVIDER_SITE_OTHER): Payer: Medicare PPO | Admitting: Emergency Medicine

## 2021-01-26 DIAGNOSIS — Z95810 Presence of automatic (implantable) cardiac defibrillator: Secondary | ICD-10-CM

## 2021-01-26 LAB — CUP PACEART INCLINIC DEVICE CHECK
Date Time Interrogation Session: 20220401165942
Implantable Lead Implant Date: 20130111
Implantable Lead Location: 753860
Implantable Lead Model: 6935
Implantable Pulse Generator Implant Date: 20130111
Lead Channel Setting Pacing Amplitude: 2.5 V
Lead Channel Setting Pacing Pulse Width: 0.4 ms
Lead Channel Setting Sensing Sensitivity: 0.3 mV

## 2021-01-26 NOTE — Telephone Encounter (Signed)
Pt called, need clinical staff to f/u with last visit on 03/28, in regards to turning defibrillator off, please advise

## 2021-01-26 NOTE — Telephone Encounter (Signed)
Spoek with pt daughter, Caryl Pina, per message from Heart Failure clinic.    After much communication with [employer] rep, it appears it is their company policy to not offer home visits due to legal constraints.    Offered patient's daughter in-clinic visit for anytime that they can come in today.  She agreed and can bring patient to clinic at 4:30pm today.  [employer] rep notified and ageed he can be present for visit.

## 2021-01-26 NOTE — Progress Notes (Signed)
Patient in office for Device reprogramming.  Per Dr. Haroldine Laws and Dr. Lovena Le.  Tachy therapy disabled and all device alerts programmed off with assistance from industry.

## 2021-01-26 NOTE — Telephone Encounter (Addendum)
Spoke with pts daughter Caryl Pina. She is aware that we have contacted the device rep on 3/28,3/30, 3/31, and today 4/1.  She thanked me for the call.

## 2021-02-19 ENCOUNTER — Telehealth: Payer: Self-pay

## 2021-02-19 NOTE — Telephone Encounter (Signed)
Spoke with pt daughter Hildred Alamin.  She reports patient passed away last week.  Pt is to be cremated, she will inform funeral home of implanted device for removal prior to cremation.

## 2021-02-25 DEATH — deceased

## 2021-03-20 NOTE — Progress Notes (Deleted)
HPI: FU coronary disease and CHF. He had PCI of his LAD and right coronary artery2003. His ejection fraction was 30%.Has ICD.Abd ultrasound 10/14 showed no aneurysm.Cardiac catheterization April 2020 showed patent LAD stent, 50% distal LAD, 95% distal right coronary artery which was treated with a drug-eluting stent. Carotid Dopplers April 2020 showed no significant obstruction. ABIs July 2021 normal.Last echocardiogram August 2021 showed ejection fraction 20 to 25%, mild left ventricular enlargement, restrictive filling, mild RV dysfunction, mild right ventricular enlargement, severe pulmonary hypertension, biatrial enlargement, severe mitral regurgitation, moderate tricuspid regurgitation,mild to moderate aortic insufficiency and moderately dilated ascending aorta at 46 mm. CTA September 2021 showed dilated ascending aorta measuring 4.2 cm;6 mm left lung nodule and follow-up recommended in 6 to 12 months.  Seen by Dr. Haroldine Laws in CHF clinic and felt to be end-stage.  Carvedilol was discontinued and home Lasix was initiated.  Since I last saw him,   Current Outpatient Medications  Medication Sig Dispense Refill  . aspirin EC 81 MG tablet Take 81 mg by mouth daily. Swallow whole.    Marland Kitchen LORazepam (ATIVAN) 0.5 MG tablet Take 0.5 mg by mouth daily in the afternoon.    Marland Kitchen LORazepam (ATIVAN) 0.5 MG tablet Take 0.5 mg by mouth at bedtime as needed for anxiety.    . Melatonin 5 MG CAPS Take 10 mg by mouth at bedtime.    Marland Kitchen QUEtiapine (SEROQUEL) 100 MG tablet Take 100 mg by mouth at bedtime.    Marland Kitchen spironolactone (ALDACTONE) 25 MG tablet Take 25 mg by mouth daily.    Marland Kitchen torsemide (DEMADEX) 20 MG tablet Take 30 mg by mouth daily.    . traZODone (DESYREL) 100 MG tablet Take 100 mg by mouth at bedtime.     No current facility-administered medications for this visit.     Past Medical History:  Diagnosis Date  . Anxiety    Takes Ativan as needed  . Cardiac dysrhythmia, unspecified   . CHF  (congestive heart failure) ([place])   . Coronary artery disease    Hx of cardiac caths   . Enlarged prostate   . GERD (gastroesophageal reflux disease)    Takes prilosec  . Gout   . Heart attack ([place])   . HTN (hypertension)   . Hyperkalemia   . Hyperlipemia   . Ischemic cardiomyopathy   . LV dysfunction   . Myocardial infarct ([place]) 1988  . Panic attacks   . Personal history of peptic ulcer disease   . Renal insufficiency   . Syncope and collapse   . Ventricular tachycardia [facility])     Past Surgical History:  Procedure Laterality Date  . CARDIAC CATHETERIZATION  2003  . CARDIAC DEFIBRILLATOR PLACEMENT    . COLONOSCOPY    . CORONARY ANGIOPLASTY    . CORONARY STENT INTERVENTION N/A 02/22/2019   Procedure: CORONARY STENT INTERVENTION;  Surgeon: Jettie Booze, MD;  Location: [place] CV LAB;  Service: Cardiovascular;  Laterality: N/A;  . ESOPHAGOGASTRODUODENOSCOPY     with biopsy  . HERNIA REPAIR  01/01/2012  . INSERT / REPLACE / REMOVE PACEMAKER  2005   ICD; replaced in Jan 2013  . LEAD REVISION N/A 11/08/2011   Procedure: LEAD REVISION;  Surgeon: Thompson Grayer, MD;  Location: [facility] CATH LAB;  Service: Cardiovascular;  Laterality: N/A;  . LEFT HEART CATH AND CORONARY ANGIOGRAPHY N/A 02/22/2019   Procedure: LEFT HEART CATH AND CORONARY ANGIOGRAPHY;  Surgeon: Jettie Booze, MD;  Location: [place] CV [facility];  Service: Cardiovascular;  Laterality: N/A;  . UMBILICAL HERNIA REPAIR    . UMBILICAL HERNIA REPAIR  01/01/2012   Procedure: HERNIA REPAIR UMBILICAL ADULT;  Surgeon: Merrie Roof, MD;  Location: [place];  Service: General;  Laterality: N/A;  umbilical hernia repair with mesh    Social History   Socioeconomic History  . Marital status: Married    Spouse name: Not on file  . Number of children: Not on file  . Years of education: Not on file  . Highest education level: Not on file  Occupational History  . Not on file  Tobacco Use  . Smoking status: Never Smoker  .  Smokeless tobacco: Never Used  Substance and Sexual Activity  . Alcohol use: Yes    Comment: occassionally  . Drug use: No  . Sexual activity: Not Currently  Other Topics Concern  . Not on file  Social History Narrative  . Not on file   Social Determinants of Health   Financial Resource Strain: Not on file  Food Insecurity: Not on file  Transportation Needs: Not on file  Physical Activity: Not on file  Stress: Not on file  Social Connections: Not on file  Intimate Partner Violence: Not on file    Family History  Problem Relation Age of Onset  . Heart attack Father        x2  . Heart disease Father   . Cancer Father        bladder and prostate  . Anesthesia problems Neg Hx   . Hypotension Neg Hx   . Malignant hyperthermia Neg Hx   . Pseudochol deficiency Neg Hx     ROS: no fevers or chills, productive cough, hemoptysis, dysphasia, odynophagia, melena, hematochezia, dysuria, hematuria, rash, seizure activity, orthopnea, PND, pedal edema, claudication. Remaining systems are negative.  Physical Exam: Well-developed well-nourished in no acute distress.  Skin is warm and dry.  HEENT is normal.  Neck is supple.  Chest is clear to auscultation with normal expansion.  Cardiovascular exam is regular rate and rhythm.  Abdominal exam nontender or distended. No masses palpated. Extremities show no edema. neuro grossly intact  ECG- personally reviewed  A/P  1 chronic systolic congestive heart failure-volume status appears to be stable; continue present dose of diuretic. Pt now on hospice.   2 ischemic cardiomyopathy-beta-blocker discontinued due to low output.    3 coronary artery disease-continue medical therapy.  4 hyperlipidemia-continue statin.  5 previous ICD-patient is a no CODE BLUE and on hospice; ICD deactivated.  6 mitral regurgitation-felt not to be a candidate for MitraClip.  7 chronic stage III kidney disease-blood work scheduled for March  11.  8 lung nodule-patient now on hospice; will not perform fu imaging.  Kirk Ruths, MD

## 2021-04-03 ENCOUNTER — Ambulatory Visit: Payer: Medicare PPO | Admitting: Cardiology

## 2021-06-26 IMAGING — CT CT ANGIO CHEST
2 of 6 series · 13 of 36 positions shown · IV contrast (iopamidol)
Comparison: No prior CT

CLINICAL DATA: 87-year-old male with cardiac echo positive for
dilated aorta. History of congestive heart failure

EXAM:
CT ANGIOGRAPHY CHEST WITH CONTRAST
TECHNIQUE: Multidetector CT imaging of the chest was performed using the
standard protocol during bolus administration of intravenous
contrast. Multiplanar CT image reconstructions and MIPs were
obtained to evaluate the vascular anatomy.
CONTRAST:  75mL 40Y9IF-D4L IOPAMIDOL (40Y9IF-D4L) INJECTION 76%

[Series 4: cta thorax 2.00 bv36 s3 axial arterial · axial · arterial · 0.57mm/px · z∈[+1428,+1706]mm · 12 of 165 slices shown]
[im 13/165  lung]
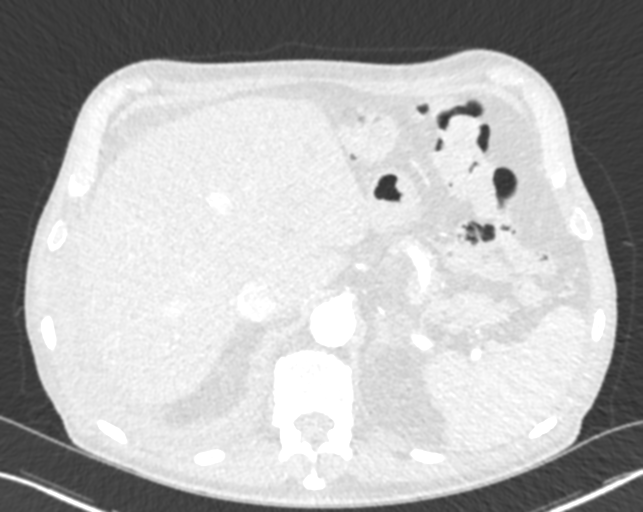
[im 26/165  mediastinal]
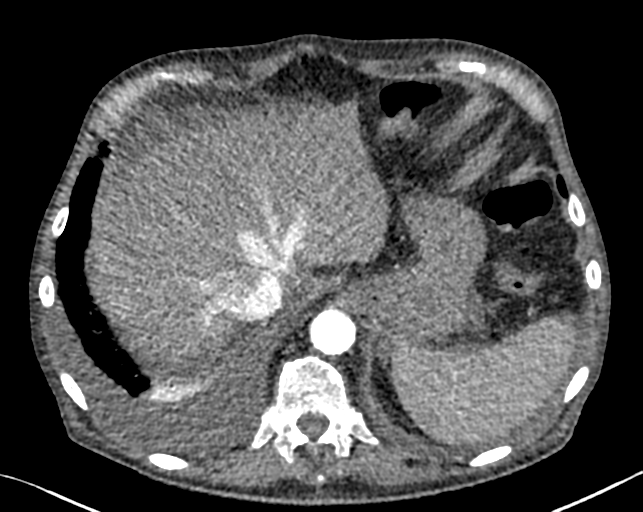
[im 38/165  lung]
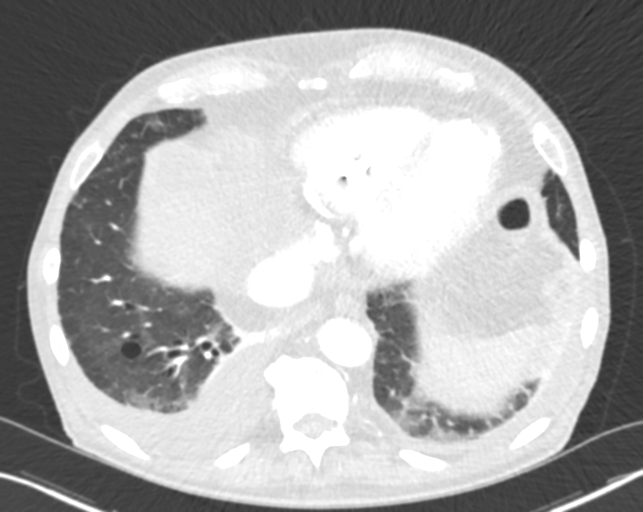
[im 51/165  mediastinal]
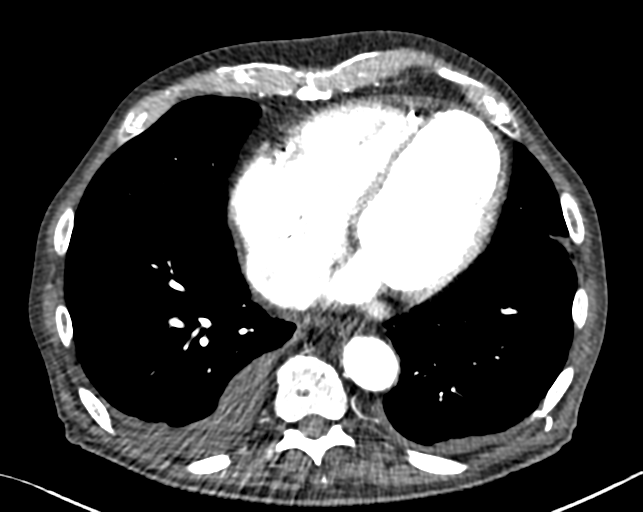
[im 64/165  lung]
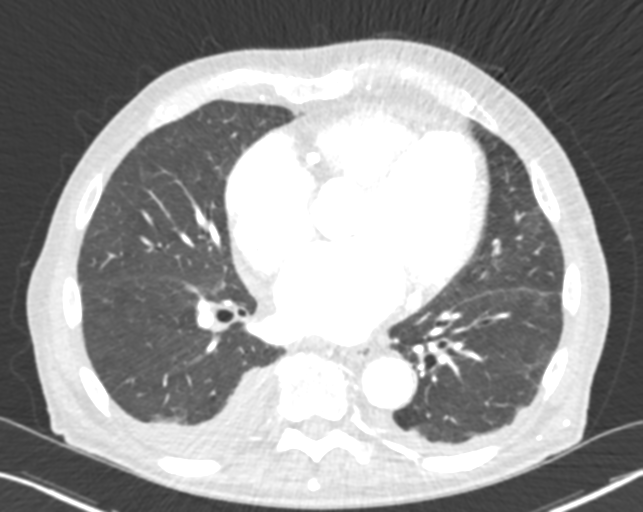
[im 76/165  mediastinal]
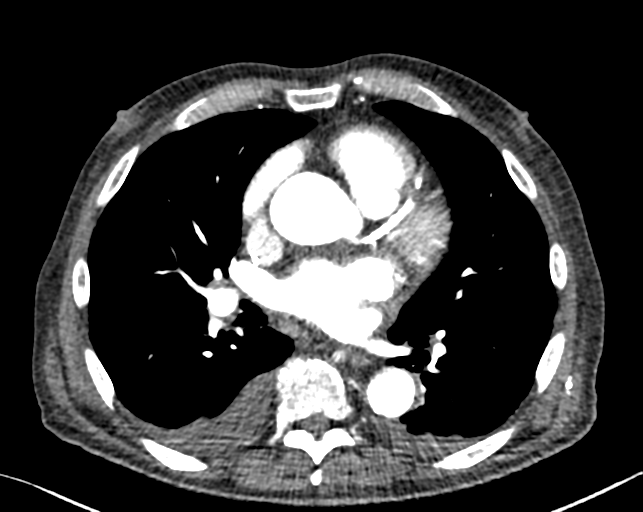
[im 89/165  lung]
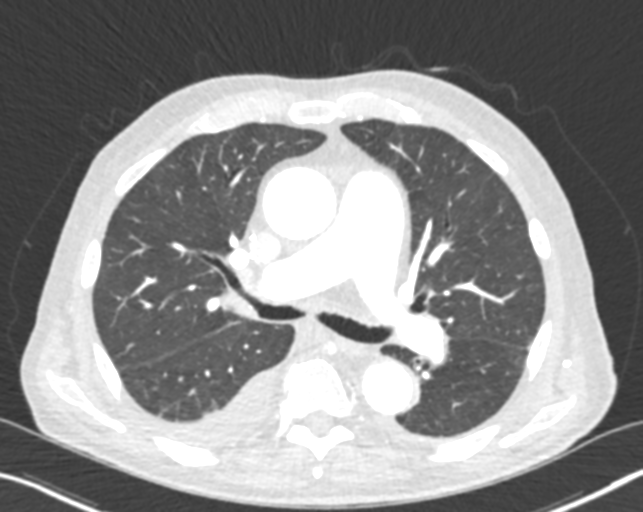
[im 101/165  mediastinal]
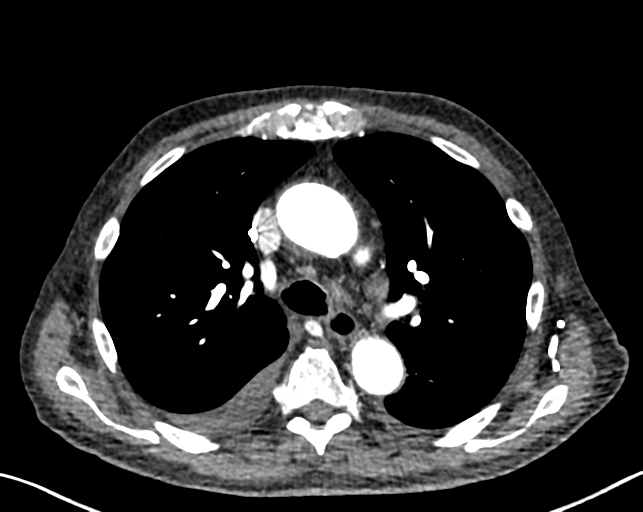
[im 114/165  lung]
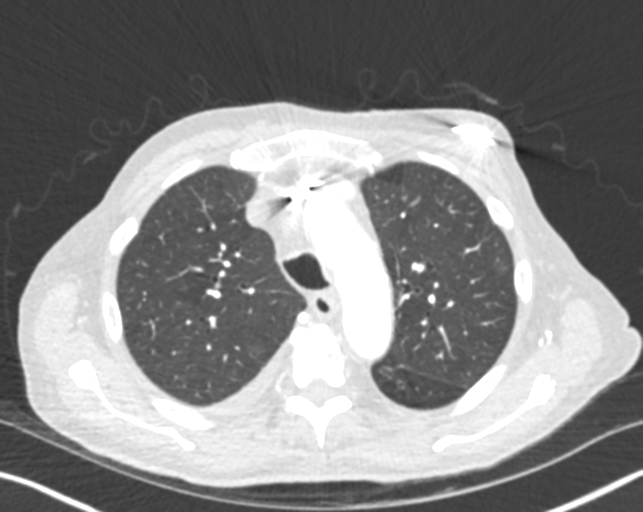
[im 127/165  mediastinal]
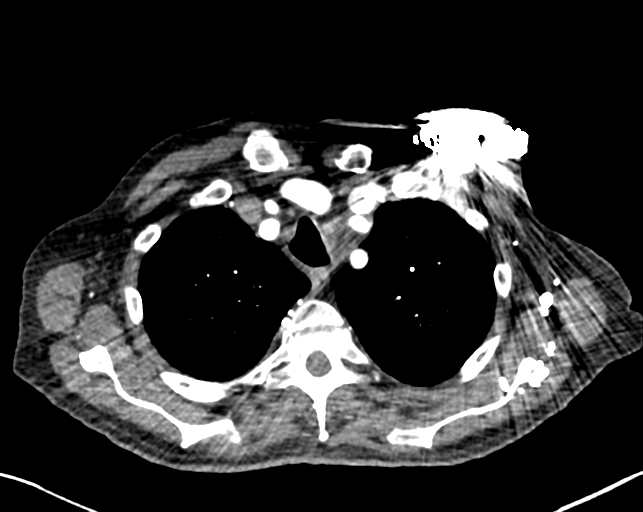
[im 139/165  lung]
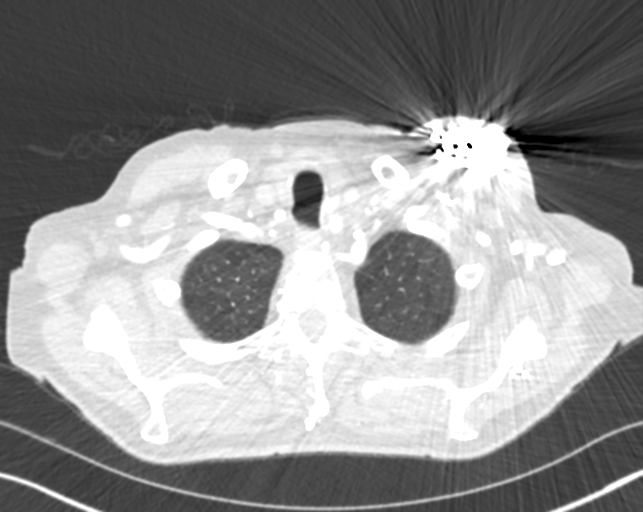
[im 152/165  mediastinal]
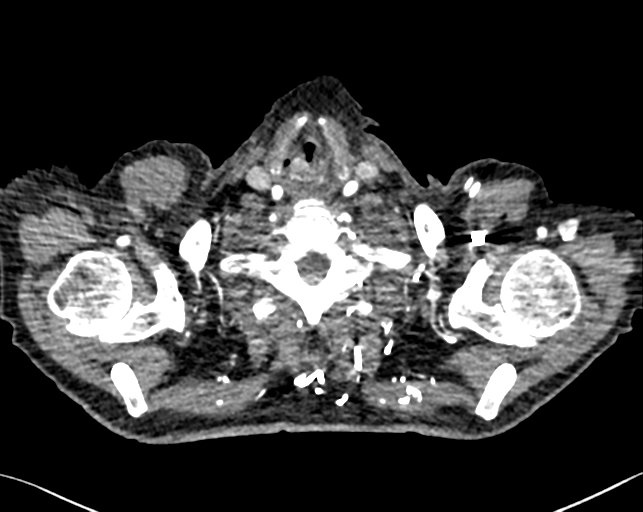

[Series 9: cta thorax 2.00 bv36 s3 cor st · coronal · 0.65mm/px · 1 of 146 slices shown]
[im 73/146  mediastinal]
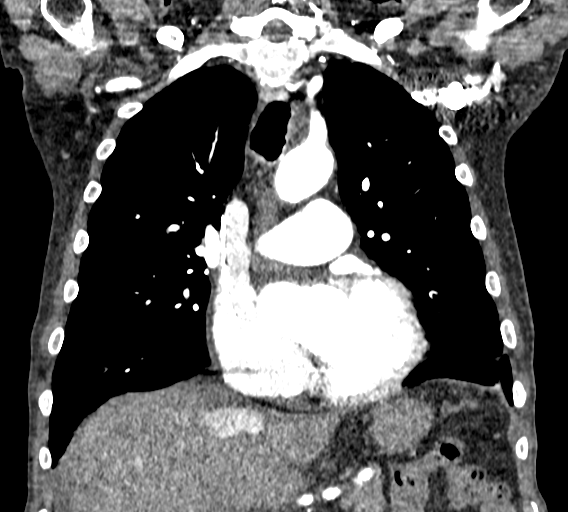

[13 of 36 positions shown; findings below may reference images not displayed]

FINDINGS: Cardiovascular:

Heart:

Cardiomegaly. No significant pericardial fluid/thickening. Coronary
calcifications of the left main, left anterior descending, right
coronary arteries.

Left chest wall cardiac pacing device/AICD via subclavian approach
with leads terminating within right ventricle.

Aorta:

No significant aortic valve calcifications. Estimated greatest
diameter of the ascending aorta 4.2 cm. Mild atherosclerosis of the
aortic arch. Branch vessels are patent with minimal atherosclerosis.

No periaortic fluid.  No evidence of dissection.

Pulmonary arteries:

Timing of the contrast bolus not optimized for the pulmonary
arteries, however, no proximal filling defects are identified. Main
pulmonary artery measures 3.9 cm.

Mediastinum/Nodes: No mediastinal adenopathy. Unremarkable
appearance of the thoracic esophagus.

Unremarkable thoracic inlet

Lungs/Pleura: Small right-sided pleural effusion. Trace left-sided
pleural fluid. Associated atelectasis bilaterally.

No pneumothorax.

Mild ground-glass of the right costophrenic sulcus, presumably
atelectatic changes given the adjacent pleural effusion. No
interlobular septal thickening to indicate edema, and no evidence of
confluent airspace disease.

Nodule of the left lower lobe, image 86 of series 6 measuring 6 mm.

Upper Abdomen: No acute finding

Musculoskeletal: No acute displaced fracture. Degenerative changes
of the visualized thoracic spine.

Review of the MIP images confirms the above findings.
IMPRESSION: Right-sided small pleural effusion and trace left-sided pleural
effusion with associated atelectasis at the bilateral lung bases. No
evidence of overt pulmonary edema.

Estimated greatest diameter of the ascending aorta 4.2 cm. Recommend
annual imaging followup by CTA or MRA. This recommendation follows
6909 ACCF/AHA/AATS/ACR/ASA/SCA/BRANNAN/ALP/GUTIEREZ/CHEVALIER Guidelines for the
Diagnosis and Management of Patients with Thoracic Aortic Disease.
Circulation. 6909; 121: [id number]. Aortic aneurysm NOS (LEDZB-YIM.U)

Cardiomegaly.  Coronary artery disease.

Main pulmonary artery measures 3.9 cm, suggesting pulmonary
hypertension.

Aortic Atherosclerosis (LEDZB-K3M.M).

Left lower lobe 6 mm lung nodule. Non-contrast chest CT at 6-12
months is recommended. If the nodule is stable at time of repeat CT,
then future CT at 18-24 months (from today's scan) is considered
optional for low-risk patients, but is recommended for high-risk
patients. This recommendation follows the consensus statement:
Guidelines for Management of Incidental Pulmonary Nodules Detected
[DATE].
